# Patient Record
Sex: Male | Born: 1955 | Hispanic: No | Marital: Married | Smoking: Current every day smoker
Health system: Southern US, Community
[De-identification: ages and names within clinical notes are randomized; demographics above are authoritative.]

## PROBLEM LIST (undated history)

## (undated) DIAGNOSIS — G610 Guillain-Barre syndrome: Secondary | ICD-10-CM

## (undated) DIAGNOSIS — G629 Polyneuropathy, unspecified: Secondary | ICD-10-CM

## (undated) HISTORY — DX: Guillain-Barre syndrome: G61.0

## (undated) HISTORY — DX: Polyneuropathy, unspecified: G62.9

---

## 2020-08-10 ENCOUNTER — Other Ambulatory Visit: Payer: Self-pay

## 2020-08-10 ENCOUNTER — Encounter (HOSPITAL_BASED_OUTPATIENT_CLINIC_OR_DEPARTMENT_OTHER): Payer: Self-pay

## 2020-08-10 DIAGNOSIS — M791 Myalgia, unspecified site: Secondary | ICD-10-CM | POA: Insufficient documentation

## 2020-08-10 DIAGNOSIS — F17210 Nicotine dependence, cigarettes, uncomplicated: Secondary | ICD-10-CM | POA: Insufficient documentation

## 2020-08-10 DIAGNOSIS — M79601 Pain in right arm: Secondary | ICD-10-CM | POA: Insufficient documentation

## 2020-08-10 DIAGNOSIS — Z20822 Contact with and (suspected) exposure to covid-19: Secondary | ICD-10-CM | POA: Insufficient documentation

## 2020-08-10 DIAGNOSIS — E1140 Type 2 diabetes mellitus with diabetic neuropathy, unspecified: Secondary | ICD-10-CM | POA: Diagnosis not present

## 2020-08-10 DIAGNOSIS — R202 Paresthesia of skin: Secondary | ICD-10-CM | POA: Diagnosis not present

## 2020-08-10 LAB — CBC WITH DIFFERENTIAL/PLATELET
Abs Immature Granulocytes: 0.12 10*3/uL — ABNORMAL HIGH (ref 0.00–0.07)
Basophils Absolute: 0.1 10*3/uL (ref 0.0–0.1)
Basophils Relative: 1 %
Eosinophils Absolute: 0.4 10*3/uL (ref 0.0–0.5)
Eosinophils Relative: 2 %
HCT: 46.8 % (ref 39.0–52.0)
Hemoglobin: 15.5 g/dL (ref 13.0–17.0)
Immature Granulocytes: 1 %
Lymphocytes Relative: 14 %
Lymphs Abs: 2.3 10*3/uL (ref 0.7–4.0)
MCH: 27.9 pg (ref 26.0–34.0)
MCHC: 33.1 g/dL (ref 30.0–36.0)
MCV: 84.3 fL (ref 80.0–100.0)
Monocytes Absolute: 1.2 10*3/uL — ABNORMAL HIGH (ref 0.1–1.0)
Monocytes Relative: 7 %
Neutro Abs: 12.4 10*3/uL — ABNORMAL HIGH (ref 1.7–7.7)
Neutrophils Relative %: 75 %
Platelets: 412 10*3/uL — ABNORMAL HIGH (ref 150–400)
RBC: 5.55 MIL/uL (ref 4.22–5.81)
RDW: 12.9 % (ref 11.5–15.5)
WBC: 16.4 10*3/uL — ABNORMAL HIGH (ref 4.0–10.5)
nRBC: 0 % (ref 0.0–0.2)

## 2020-08-10 LAB — BASIC METABOLIC PANEL
Anion gap: 12 (ref 5–15)
BUN: 20 mg/dL (ref 8–23)
CO2: 23 mmol/L (ref 22–32)
Calcium: 9.5 mg/dL (ref 8.9–10.3)
Chloride: 99 mmol/L (ref 98–111)
Creatinine, Ser: 1.14 mg/dL (ref 0.61–1.24)
GFR, Estimated: >60 mL/min (ref >60)
Glucose, Bld: 113 mg/dL — ABNORMAL HIGH (ref 70–99)
Potassium: 3.8 mmol/L (ref 3.5–5.1)
Sodium: 134 mmol/L — ABNORMAL LOW (ref 135–145)

## 2020-08-10 NOTE — ED Triage Notes (Addendum)
Pt c/o pain to bilat LE and right UE-pain started 1/29-denies injury-NAD-steady gait-son later added pt c/o pain all over-denies flu sx, n/v/d-pt speaks some english interpreting prn

## 2020-08-11 ENCOUNTER — Other Ambulatory Visit: Payer: Self-pay

## 2020-08-11 ENCOUNTER — Emergency Department (HOSPITAL_BASED_OUTPATIENT_CLINIC_OR_DEPARTMENT_OTHER)
Admission: EM | Admit: 2020-08-11 | Discharge: 2020-08-11 | Disposition: A | Discharge status: Home / Self Care | Payer: Medicare Other

## 2020-08-11 ENCOUNTER — Emergency Department (HOSPITAL_BASED_OUTPATIENT_CLINIC_OR_DEPARTMENT_OTHER)
Admission: EM | Admit: 2020-08-11 | Discharge: 2020-08-11 | Disposition: A | Discharge status: Home / Self Care | Payer: Medicare Other | Source: Home / Self Care | Attending: Emergency Medicine | Admitting: Emergency Medicine

## 2020-08-11 DIAGNOSIS — M791 Myalgia, unspecified site: Secondary | ICD-10-CM

## 2020-08-11 LAB — URINALYSIS, ROUTINE W REFLEX MICROSCOPIC
Bilirubin Urine: NEGATIVE
Glucose, UA: NEGATIVE mg/dL
Ketones, ur: NEGATIVE mg/dL
Leukocytes,Ua: NEGATIVE
Nitrite: NEGATIVE
Protein, ur: 30 mg/dL — AB
Specific Gravity, Urine: 1.02 (ref 1.005–1.030)
pH: 5 (ref 5.0–8.0)

## 2020-08-11 LAB — URINALYSIS, MICROSCOPIC (REFLEX)
Bacteria, UA: NONE SEEN
WBC, UA: NONE SEEN WBC/hpf (ref 0–5)

## 2020-08-11 LAB — SARS CORONAVIRUS 2 (TAT 6-24 HRS): SARS Coronavirus 2: NEGATIVE

## 2020-08-11 LAB — CK: Total CK: 241 U/L (ref 49–397)

## 2020-08-11 MED ORDER — NAPROXEN 375 MG PO TABS: ORAL_TABLET | ORAL | 0 refills | Status: DC

## 2020-08-11 MED ORDER — NAPROXEN 250 MG PO TABS
500.0000 mg | ORAL_TABLET | Freq: Once | ORAL | Status: AC
  Administered 2020-08-11: 500 mg via ORAL
  Filled 2020-08-11: qty 2

## 2020-08-11 NOTE — ED Notes (Signed)
Patient verbalizes understanding of discharge instructions. Opportunity for questioning and answers were provided. Armband removed by staff, pt discharged from ED ambulatory to home.  

## 2020-08-11 NOTE — ED Provider Notes (Signed)
[facility] Provider Note: [name], MD, FACEP  CSN: [id number] MRN: [id number] ARRIVAL: 08/10/20 at 2023 ROOM: MH05/MH05   CHIEF COMPLAINT  Leg Pain and Generalized Body Aches   HISTORY OF PRESENT ILLNESS  08/11/20 2:03 AM Jacob Cameron is a 65 y.o. male with 3 days of pain in his legs and right arm.  The pain is aching in nature and he rates it as a 5 out of 10.  There is also some generalized body pain as well.  It is somewhat worse with movement.  He has not taken anything for it.  He has not noted fever or other Covid symptoms.  He denies running, lifting or otherwise exerting himself but has been walking.  History reviewed. No pertinent past medical history.  History reviewed. No pertinent surgical history.  No family history on file.  Social History   Tobacco Use  . Smoking status: Current Every Day Smoker    Types: Cigarettes  . Smokeless tobacco: Never Used  Substance Use Topics  . Alcohol use: Never  . Drug use: Never    Prior to Admission medications   Medication Sig Start Date End Date Taking? Authorizing Provider  naproxen (NAPROSYN) 375 MG tablet Take 1 tablet twice daily as needed for muscle pain. 08/11/20  Yes Davinia Riccardi, MD    Allergies Patient has no known allergies.   REVIEW OF SYSTEMS  Negative except as noted here or in the History of Present Illness.   PHYSICAL EXAMINATION  Initial Vital Signs Blood pressure 133/83, pulse (!) 101, temperature (!) 97.5 F (36.4 C), temperature source Oral, resp. rate 16, height 5\' 7"  (1.702 m), weight 80.3 kg, SpO2 97 %.  Examination General: Well-developed, well-nourished male in no acute distress; appearance consistent with age of record HENT: normocephalic; atraumatic Eyes: pupils equal, round and reactive to light; extraocular muscles intact Neck: supple Heart: regular rate and rhythm Lungs: clear to auscultation bilaterally Abdomen: soft; nondistended; nontender; bowel sounds  present Extremities: No deformity; full range of motion; pulses normal; muscle compartments soft Neurologic: Awake, alert and oriented; motor function intact in all extremities and symmetric; no facial droop Skin: Warm and dry Psychiatric: Normal mood and affect   RESULTS  Summary of this visit's results, reviewed and interpreted by myself:   EKG Interpretation  Date/Time:    Ventricular Rate:    PR Interval:    QRS Duration:   QT Interval:    QTC Calculation:   R Axis:     Text Interpretation:        Laboratory Studies: Results for orders placed or performed during the hospital encounter of 08/11/20 (from the past 24 hour(s))  CBC with Differential     Status: Abnormal   Collection Time: 08/10/20  9:20 PM  Result Value Ref Range   WBC 16.4 (H) 4.0 - 10.5 K/uL   RBC 5.55 4.22 - 5.81 MIL/uL   Hemoglobin 15.5 13.0 - 17.0 g/dL   HCT 08/12/20 86.5 - 78.4 %   MCV 84.3 80.0 - 100.0 fL   MCH 27.9 26.0 - 34.0 pg   MCHC 33.1 30.0 - 36.0 g/dL   RDW 69.6 29.5 - 28.4 %   Platelets 412 (H) 150 - 400 K/uL   nRBC 0.0 0.0 - 0.2 %   Neutrophils Relative % 75 %   Neutro Abs 12.4 (H) 1.7 - 7.7 K/uL   Lymphocytes Relative 14 %   Lymphs Abs 2.3 0.7 - 4.0 K/uL   Monocytes Relative 7 %  Monocytes Absolute 1.2 (H) 0.1 - 1.0 K/uL   Eosinophils Relative 2 %   Eosinophils Absolute 0.4 0.0 - 0.5 K/uL   Basophils Relative 1 %   Basophils Absolute 0.1 0.0 - 0.1 K/uL   Immature Granulocytes 1 %   Abs Immature Granulocytes 0.12 (H) 0.00 - 0.07 K/uL  Basic metabolic panel     Status: Abnormal   Collection Time: 08/10/20  9:20 PM  Result Value Ref Range   Sodium 134 (L) 135 - 145 mmol/L   Potassium 3.8 3.5 - 5.1 mmol/L   Chloride 99 98 - 111 mmol/L   CO2 23 22 - 32 mmol/L   Glucose, Bld 113 (H) 70 - 99 mg/dL   BUN 20 8 - 23 mg/dL   Creatinine, Ser 0.11 0.61 - 1.24 mg/dL   Calcium 9.5 8.9 - 00.3 mg/dL   GFR, Estimated >49 >61 mL/min   Anion gap 12 5 - 15  CK     Status: None   Collection  Time: 08/10/20  9:20 PM  Result Value Ref Range   Total CK 241 49 - 397 U/L  Urinalysis, Routine w reflex microscopic     Status: Abnormal   Collection Time: 08/11/20 12:50 AM  Result Value Ref Range   Color, Urine YELLOW YELLOW   APPearance CLEAR CLEAR   Specific Gravity, Urine 1.020 1.005 - 1.030   pH 5.0 5.0 - 8.0   Glucose, UA NEGATIVE NEGATIVE mg/dL   Hgb urine dipstick LARGE (A) NEGATIVE   Bilirubin Urine NEGATIVE NEGATIVE   Ketones, ur NEGATIVE NEGATIVE mg/dL   Protein, ur 30 (A) NEGATIVE mg/dL   Nitrite NEGATIVE NEGATIVE   Leukocytes,Ua NEGATIVE NEGATIVE  Urinalysis, Microscopic (reflex)     Status: None   Collection Time: 08/11/20 12:50 AM  Result Value Ref Range   RBC / HPF 0-5 0 - 5 RBC/hpf   WBC, UA NONE SEEN 0 - 5 WBC/hpf   Bacteria, UA NONE SEEN NONE SEEN   Squamous Epithelial / LPF 0-5 0 - 5   Granular Casts, UA PRESENT    Imaging Studies: No results found.  ED COURSE and MDM  Nursing notes, initial and subsequent vitals signs, including pulse oximetry, reviewed and interpreted by myself.  Vitals:   08/10/20 2036 08/11/20 0054 08/11/20 0253  BP: (!) 136/102 133/83 125/89  Pulse: (!) 105 (!) 101 100  Resp: 18 16 16   Temp: (!) 97.5 F (36.4 C)    TempSrc: Oral    SpO2: 99% 97% 95%  Weight: 80.3 kg    Height: 5\' 7"  (1.702 m)     Medications  naproxen (NAPROSYN) tablet 500 mg (500 mg Oral Given 08/11/20 0253)   3:00 AM The cause of the patient's pain is unclear.  No evidence of rhabdomyolysis.  He has not been febrile or had other Covid-like symptoms but a Covid test is pending.  He does have an elevated white count but this is nonspecific.  We will refer to sports medicine for further evaluation.  Differential diagnosis includes rheumatologic condition such as polymyalgia rheumatica or polymyositis which are difficult to diagnose in the emergency department setting.   PROCEDURES  Procedures   ED DIAGNOSES     ICD-10-CM   1. Muscle pain  M79.10         Ersilia Brawley, , MD 08/11/20 [phone]

## 2020-08-12 ENCOUNTER — Encounter (HOSPITAL_BASED_OUTPATIENT_CLINIC_OR_DEPARTMENT_OTHER): Payer: Self-pay | Admitting: *Deleted

## 2020-08-12 ENCOUNTER — Inpatient Hospital Stay (HOSPITAL_BASED_OUTPATIENT_CLINIC_OR_DEPARTMENT_OTHER)
Admission: EM | Admit: 2020-08-12 | Discharge: 2020-09-01 | DRG: 074 | Disposition: A | Discharge status: Rehabilitation Facility | Payer: Medicare Other | Attending: Internal Medicine | Admitting: Internal Medicine

## 2020-08-12 ENCOUNTER — Other Ambulatory Visit: Payer: Self-pay

## 2020-08-12 DIAGNOSIS — Z20822 Contact with and (suspected) exposure to covid-19: Secondary | ICD-10-CM | POA: Diagnosis present

## 2020-08-12 DIAGNOSIS — G610 Guillain-Barre syndrome: Secondary | ICD-10-CM | POA: Diagnosis present

## 2020-08-12 DIAGNOSIS — Z720 Tobacco use: Secondary | ICD-10-CM | POA: Diagnosis present

## 2020-08-12 DIAGNOSIS — M47816 Spondylosis without myelopathy or radiculopathy, lumbar region: Secondary | ICD-10-CM | POA: Diagnosis present

## 2020-08-12 DIAGNOSIS — E785 Hyperlipidemia, unspecified: Secondary | ICD-10-CM | POA: Diagnosis present

## 2020-08-12 DIAGNOSIS — M47814 Spondylosis without myelopathy or radiculopathy, thoracic region: Secondary | ICD-10-CM | POA: Diagnosis present

## 2020-08-12 DIAGNOSIS — M792 Neuralgia and neuritis, unspecified: Secondary | ICD-10-CM

## 2020-08-12 DIAGNOSIS — G603 Idiopathic progressive neuropathy: Secondary | ICD-10-CM | POA: Diagnosis present

## 2020-08-12 DIAGNOSIS — R946 Abnormal results of thyroid function studies: Secondary | ICD-10-CM | POA: Diagnosis present

## 2020-08-12 DIAGNOSIS — F40240 Claustrophobia: Secondary | ICD-10-CM | POA: Diagnosis present

## 2020-08-12 DIAGNOSIS — I10 Essential (primary) hypertension: Secondary | ICD-10-CM | POA: Diagnosis present

## 2020-08-12 DIAGNOSIS — M4722 Other spondylosis with radiculopathy, cervical region: Secondary | ICD-10-CM | POA: Diagnosis present

## 2020-08-12 DIAGNOSIS — E1140 Type 2 diabetes mellitus with diabetic neuropathy, unspecified: Principal | ICD-10-CM | POA: Diagnosis present

## 2020-08-12 DIAGNOSIS — R202 Paresthesia of skin: Secondary | ICD-10-CM | POA: Diagnosis present

## 2020-08-12 DIAGNOSIS — D72828 Other elevated white blood cell count: Secondary | ICD-10-CM | POA: Diagnosis present

## 2020-08-12 DIAGNOSIS — K0889 Other specified disorders of teeth and supporting structures: Secondary | ICD-10-CM | POA: Diagnosis present

## 2020-08-12 DIAGNOSIS — E222 Syndrome of inappropriate secretion of antidiuretic hormone: Secondary | ICD-10-CM | POA: Diagnosis present

## 2020-08-12 DIAGNOSIS — F419 Anxiety disorder, unspecified: Secondary | ICD-10-CM | POA: Diagnosis present

## 2020-08-12 DIAGNOSIS — F17210 Nicotine dependence, cigarettes, uncomplicated: Secondary | ICD-10-CM | POA: Diagnosis present

## 2020-08-12 DIAGNOSIS — R5381 Other malaise: Secondary | ICD-10-CM | POA: Diagnosis present

## 2020-08-12 DIAGNOSIS — E538 Deficiency of other specified B group vitamins: Secondary | ICD-10-CM | POA: Diagnosis present

## 2020-08-12 NOTE — ED Provider Notes (Addendum)
[facility] Provider Note: [name], MD, FACEP  CSN: [id number] MRN: [id number] ARRIVAL: 08/12/20 at 2044 ROOM: MH06/MH06   CHIEF COMPLAINT  Pain   HISTORY OF PRESENT ILLNESS  08/12/20 10:59 PM Jacob Cameron is a 65 y.o. male who was seen by myself yesterday for muscle pain.  To wit:  "Jacob Cameron is a 65 y.o. male with 3 days of pain in his legs and right arm.  The pain is aching in nature and he rates it as a 5 out of 10.  There is also some generalized body pain as well.  It is somewhat worse with movement.  He has not taken anything for it.  He has not noted fever or other Covid symptoms.  He denies running, lifting or otherwise exerting himself but has been walking."  His laboratory studies were unremarkable and his Covid test, which resulted later, is negative as well.  He was treated with naproxen and referred to sports medicine and he has an appointment tomorrow.  He is here requesting something stronger for the pain as he now rates it a 10 out of 10.  The pain not only worsened since yesterday but he is now having paresthesias of the right arm and lower extremities.  He states the pain is so severe he is not able to walk.  He denies muscle weakness but it simply hurts too much to move.  He has not had a fever.   History reviewed. No pertinent past medical history.  History reviewed. No pertinent surgical history.  No family history on file.  Social History   Tobacco Use  . Smoking status: Current Every Day Smoker    Types: Cigarettes  . Smokeless tobacco: Never Used  Substance Use Topics  . Alcohol use: Never  . Drug use: Never    Prior to Admission medications   Medication Sig Start Date End Date Taking? Authorizing Provider  HYDROcodone-acetaminophen (NORCO) 5-325 MG tablet Take 1 tablet by mouth every 6 (six) hours as needed. 08/13/20  Yes Brithney Bensen, MD  naproxen (NAPROSYN) 375 MG tablet Take 1 tablet twice daily as needed for muscle pain. 08/11/20    Elara Cocke, Jonny Ruiz, MD    Allergies Patient has no known allergies.   REVIEW OF SYSTEMS  Negative except as noted here or in the History of Present Illness.   PHYSICAL EXAMINATION  Initial Vital Signs Blood pressure (!) 123/98, pulse (!) 105, temperature 98.4 F (36.9 C), temperature source Oral, resp. rate 20, height 5\' 7"  (1.702 m), weight 80.3 kg, SpO2 100 %.  Examination General: Well-developed, well-nourished male in no acute distress; appearance consistent with age of record HENT: normocephalic; atraumatic Eyes: pupils equal, round and reactive to light; extraocular muscles intact Neck: supple Heart: regular rate and rhythm Lungs: clear to auscultation bilaterally Abdomen: soft; nondistended; nontender; bowel sounds present Extremities: No deformity; full range of motion; pulses normal Neurologic: Awake, alert and oriented; motor function intact in all extremities and symmetric; no facial droop; superficial tenderness and altered sensation of lower extremities and right upper extremity Skin: Warm and dry Psychiatric: Normal mood and affect   RESULTS  Summary of this visit's results, reviewed and interpreted by myself:   EKG Interpretation  Date/Time:    Ventricular Rate:    PR Interval:    QRS Duration:   QT Interval:    QTC Calculation:   R Axis:     Text Interpretation:        Laboratory Studies: No results  found for this or any previous visit (from the past 24 hour(s)). Imaging Studies: No results found.  ED COURSE and MDM  Nursing notes, initial and subsequent vitals signs, including pulse oximetry, reviewed and interpreted by myself.  Vitals:   08/12/20 2053 08/12/20 2220  BP: (!) 147/101 (!) 123/98  Pulse: 89 (!) 105  Resp: 18 20  Temp: 98.4 F (36.9 C)   TempSrc: Oral   SpO2: 100% 100%  Weight: 80.3 kg   Height: 5\' 7"  (1.702 m)    Medications  HYDROcodone-acetaminophen (NORCO/VICODIN) 5-325 MG per tablet 2 tablet (2 tablets Oral Given 08/13/20  0030)    12:31 AM The patient was evaluated by teleneurology.  The teleneurologist is concerned for myelopathy or radiculopathy.  He recommends MRIs of the C, T and L-spine.  The patient has an appointment later today with Dr. 10/11/20 of sports medicine.  I will send Dr. Clare Gandy a note regarding these recommendations.  Admission to the hospital was discussed with the patient but he declined preferring to follow-up as an outpatient.  We will prescribe hydrocodone in the meantime.  1:24 AM At time of discharge patient states he was in too much pain to go home.  He would like to be admitted after all.  This was discussed with Dr. Jordan Likes who will admit him to the hospitalist service.  PROCEDURES  Procedures CRITICAL CARE Performed by: Loney Loh Birdie Beveridge Total critical care time: 30 minutes Critical care time was exclusive of separately billable procedures and treating other patients. Critical care was necessary to treat or prevent imminent or life-threatening deterioration. Critical care was time spent personally by me on the following activities: development of treatment plan with patient and/or surrogate as well as nursing, discussions with consultants, evaluation of patient's response to treatment, examination of patient, obtaining history from patient or surrogate, ordering and performing treatments and interventions, ordering and review of laboratory studies, ordering and review of radiographic studies, pulse oximetry and re-evaluation of patient's condition.   ED DIAGNOSES     ICD-10-CM   1. Neuropathic pain  M79.2   2. Paresthesias  R20.2        Carlisle Beers, MD 08/13/20 10/11/20    1779, MD 08/13/20 0124    10/11/20, MD 08/13/20 10/11/20

## 2020-08-12 NOTE — ED Notes (Signed)
Called Tele Neuro for non-stat consult spoke to [facility]

## 2020-08-12 NOTE — ED Triage Notes (Addendum)
Seen  Here yesterday for same C/o cont " pain all over" appt with ortho tomorrow , requesting something " stronger for pain "

## 2020-08-13 ENCOUNTER — Observation Stay (HOSPITAL_COMMUNITY): Payer: Medicare Other

## 2020-08-13 ENCOUNTER — Encounter (HOSPITAL_COMMUNITY): Payer: Self-pay | Admitting: Internal Medicine

## 2020-08-13 ENCOUNTER — Ambulatory Visit: Payer: Medicare Other | Admitting: Family Medicine

## 2020-08-13 DIAGNOSIS — R5381 Other malaise: Secondary | ICD-10-CM | POA: Diagnosis not present

## 2020-08-13 DIAGNOSIS — M792 Neuralgia and neuritis, unspecified: Secondary | ICD-10-CM

## 2020-08-13 DIAGNOSIS — Z720 Tobacco use: Secondary | ICD-10-CM | POA: Diagnosis present

## 2020-08-13 DIAGNOSIS — M47814 Spondylosis without myelopathy or radiculopathy, thoracic region: Secondary | ICD-10-CM | POA: Diagnosis not present

## 2020-08-13 DIAGNOSIS — R202 Paresthesia of skin: Secondary | ICD-10-CM | POA: Diagnosis present

## 2020-08-13 DIAGNOSIS — E538 Deficiency of other specified B group vitamins: Secondary | ICD-10-CM | POA: Diagnosis not present

## 2020-08-13 DIAGNOSIS — G610 Guillain-Barre syndrome: Secondary | ICD-10-CM | POA: Diagnosis not present

## 2020-08-13 DIAGNOSIS — E222 Syndrome of inappropriate secretion of antidiuretic hormone: Secondary | ICD-10-CM | POA: Diagnosis not present

## 2020-08-13 DIAGNOSIS — F40240 Claustrophobia: Secondary | ICD-10-CM | POA: Diagnosis not present

## 2020-08-13 DIAGNOSIS — R946 Abnormal results of thyroid function studies: Secondary | ICD-10-CM | POA: Diagnosis not present

## 2020-08-13 DIAGNOSIS — F17210 Nicotine dependence, cigarettes, uncomplicated: Secondary | ICD-10-CM | POA: Diagnosis not present

## 2020-08-13 DIAGNOSIS — E1140 Type 2 diabetes mellitus with diabetic neuropathy, unspecified: Secondary | ICD-10-CM | POA: Diagnosis not present

## 2020-08-13 DIAGNOSIS — Z20822 Contact with and (suspected) exposure to covid-19: Secondary | ICD-10-CM | POA: Diagnosis not present

## 2020-08-13 DIAGNOSIS — I10 Essential (primary) hypertension: Secondary | ICD-10-CM | POA: Diagnosis not present

## 2020-08-13 DIAGNOSIS — D72828 Other elevated white blood cell count: Secondary | ICD-10-CM | POA: Diagnosis not present

## 2020-08-13 DIAGNOSIS — F419 Anxiety disorder, unspecified: Secondary | ICD-10-CM | POA: Diagnosis not present

## 2020-08-13 DIAGNOSIS — K0889 Other specified disorders of teeth and supporting structures: Secondary | ICD-10-CM | POA: Diagnosis not present

## 2020-08-13 DIAGNOSIS — M47816 Spondylosis without myelopathy or radiculopathy, lumbar region: Secondary | ICD-10-CM | POA: Diagnosis not present

## 2020-08-13 DIAGNOSIS — M4722 Other spondylosis with radiculopathy, cervical region: Secondary | ICD-10-CM | POA: Diagnosis not present

## 2020-08-13 DIAGNOSIS — E785 Hyperlipidemia, unspecified: Secondary | ICD-10-CM | POA: Diagnosis not present

## 2020-08-13 LAB — SARS CORONAVIRUS 2 BY RT PCR (HOSPITAL ORDER, PERFORMED IN ~~LOC~~ HOSPITAL LAB): SARS Coronavirus 2: NEGATIVE

## 2020-08-13 MED ORDER — LORAZEPAM 2 MG/ML IJ SOLN
1.0000 mg | Freq: Once | INTRAMUSCULAR | Status: DC
  Filled 2020-08-13 (×2): qty 1

## 2020-08-13 MED ORDER — ENOXAPARIN SODIUM 40 MG/0.4ML ~~LOC~~ SOLN
40.0000 mg | SUBCUTANEOUS | Status: DC
  Administered 2020-08-13 – 2020-08-31 (×19): 40 mg via SUBCUTANEOUS
  Filled 2020-08-13 (×20): qty 0.4

## 2020-08-13 MED ORDER — HYDROCODONE-ACETAMINOPHEN 5-325 MG PO TABS: 1.0000 | ORAL_TABLET | Freq: Four times a day (QID) | ORAL | 0 refills | Status: DC | PRN

## 2020-08-13 MED ORDER — SENNOSIDES-DOCUSATE SODIUM 8.6-50 MG PO TABS
1.0000 | ORAL_TABLET | Freq: Every evening | ORAL | Status: DC | PRN
Start: 2020-08-13 — End: 2020-09-01
  Administered 2020-08-17: 1 via ORAL
  Filled 2020-08-13: qty 1

## 2020-08-13 MED ORDER — ACETAMINOPHEN 650 MG RE SUPP: 650.0000 mg | RECTAL | Status: DC | PRN

## 2020-08-13 MED ORDER — HYDROCODONE-ACETAMINOPHEN 5-325 MG PO TABS
2.0000 | ORAL_TABLET | ORAL | Status: DC | PRN
  Administered 2020-08-13 – 2020-08-15 (×7): 2 via ORAL
  Filled 2020-08-13 (×8): qty 2

## 2020-08-13 MED ORDER — SODIUM CHLORIDE 0.9 % IV SOLN: INTRAVENOUS | Status: DC

## 2020-08-13 MED ORDER — HYDROCODONE-ACETAMINOPHEN 5-325 MG PO TABS
2.0000 | ORAL_TABLET | Freq: Once | ORAL | Status: AC
  Administered 2020-08-13: 2 via ORAL
  Filled 2020-08-13: qty 2

## 2020-08-13 MED ORDER — ONDANSETRON HCL 4 MG/2ML IJ SOLN
4.0000 mg | Freq: Four times a day (QID) | INTRAMUSCULAR | Status: DC | PRN
  Administered 2020-08-14 – 2020-08-30 (×5): 4 mg via INTRAVENOUS
  Filled 2020-08-13 (×6): qty 2

## 2020-08-13 MED ORDER — ACETAMINOPHEN 160 MG/5ML PO SOLN: 650.0000 mg | ORAL | Status: DC | PRN

## 2020-08-13 MED ORDER — ACETAMINOPHEN 325 MG PO TABS
650.0000 mg | ORAL_TABLET | ORAL | Status: DC | PRN
  Administered 2020-08-16 – 2020-08-29 (×7): 650 mg via ORAL
  Filled 2020-08-13 (×7): qty 2

## 2020-08-13 NOTE — Progress Notes (Signed)
Admission requested from med [facility].  Signout per Dr. Jonny Ruiz Molpus: 65 year old male who initially presented to the ED 2 days ago with a 3-day history of pain in his right arm and bilateral lower extremities.  Labs done at that time revealed normal CK.  He was given referral to sports medicine but now returning to the ED with worsening pain and associated paresthesias in his right arm and bilateral lower extremities.  Telemetry neurology was consulted and felt that the patient's symptoms could be due to radiculopathy or myelopathy.  Recommended MRI of cervical, thoracic, and lumbar spine.  Bed request placed.  [facility] will assume care on arrival to accepting facility. Until arrival, care as per EDP. However, [facility] available 24/7 for questions and assistance.

## 2020-08-13 NOTE — Progress Notes (Signed)
Patient was admitted to [facility] this afternoon as a transfer from [facility]. Patient is pleasant and polite, with minimal use of the translator needed. Pt continues complaining about pain, numbness and tingling in the right hand, and legs bilaterally. PRN medications given per order.   MRI was not able to be completed this afternoon due to pain intolerance and excessive movement. MD has been updated wih plans to get Ativan IV on board prior to repeat MRI scan. Son at bedside.

## 2020-08-13 NOTE — H&P (Signed)
History and Physical    Riot Waterworth OYD:[id number] DOB: 04/09/1956 DOA: 08/12/2020  PCP: Patient, No Pcp Per Consultants:  None Patient coming from: Home; NOK: [place], [place] - [phone]  Chief Complaint: paresthesias  HPI: Jacob Cameron is a 65 y.o. male without significant medical history presenting with R arm and B leg paresthesias.  He is experiencing R neck pain with radiculopathy down his R arm and decreased grip strength.  Also pain and tingling in B legs.  He was walking independently but in the last 3-4 days he is unable to walk at all.  No urinary or fecal incontinence or other symptoms.  No fever.  No sick contacts. No recent illness prior.  Symptoms started in B legs and then the arm and the neck was last - all within the same day.  No respiratory symptoms.    ED Course:  [facility] to [facility] transfer, per Dr. Loney Loh:  65 year old male who initially presented to the ED 2 days ago with a 3-day history of pain in his right arm and bilateral lower extremities.  Labs done at that time revealed normal CK.  He was given referral to sports medicine but now returning to the ED with worsening pain and associated paresthesias in his right arm and bilateral lower extremities.  Telemetry neurology was consulted and felt that the patient's symptoms could be due to radiculopathy or myelopathy.  Recommended MRI of cervical, thoracic, and lumbar spine.   Review of Systems: As per HPI; otherwise review of systems reviewed and negative.   Ambulatory Status:  Ambulates without assistance  COVID Vaccine Status:   Complete - Johnson and Johnson, no booster  History reviewed. No pertinent past medical history.  History reviewed. No pertinent surgical history.  Social History   Socioeconomic History  . Marital status: Married    Spouse name: Not on file  . Number of children: Not on file  . Years of education: Not on file  . Highest education level: Not on file  Occupational History  . Occupation:  retired  Tobacco Use  . Smoking status: Current Every Day Smoker    Packs/day: 0.25    Years: 8.00    Pack years: 2.00    Types: Cigarettes  . Smokeless tobacco: Never Used  Substance and Sexual Activity  . Alcohol use: Never  . Drug use: Never  . Sexual activity: Not on file  Other Topics Concern  . Not on file  Social History Narrative  . Not on file   Social Determinants of Health   Financial Resource Strain: Not on file  Food Insecurity: Not on file  Transportation Needs: Not on file  Physical Activity: Not on file  Stress: Not on file  Social Connections: Not on file  Intimate Partner Violence: Not on file    No Known Allergies  History reviewed. No pertinent family history.  Prior to Admission medications   Medication Sig Start Date End Date Taking? Authorizing Provider  HYDROcodone-acetaminophen (NORCO) 5-325 MG tablet Take 1 tablet by mouth every 6 (six) hours as needed. 08/13/20  Yes Molpus, John, MD  naproxen (NAPROSYN) 375 MG tablet Take 1 tablet twice daily as needed for muscle pain. 08/11/20   Molpus, Jonny Ruiz, MD    Physical Exam: Vitals:   08/13/20 0607 08/13/20 1010 08/13/20 1208 08/13/20 1428  BP: (!) 141/91 (!) 154/118 112/80 100/76  Pulse: 85 91 90 89  Resp: 18 18 18 17   Temp:   98.4 F (36.9 C) 98.7 F (37.1  C)  TempSrc:   Oral   SpO2: 95% 95% 96% 98%  Weight:      Height:         . General:  Appears calm and comfortable and is in NAD, engaging . Eyes:  PERRL, EOMI, normal lids, iris . ENT:  grossly normal hearing, lips & tongue, mmm . Neck:  no LAD, masses or thyromegaly . Cardiovascular:  RRR, no m/r/g. No LE edema.  Marland Kitchen Respiratory:   CTA bilaterally with no wheezes/rales/rhonchi.  Normal respiratory effort. . Abdomen:  soft, NT, ND, NABS . Back:   normal alignment, no CVAT; + R trapezius muscle TTP . Skin:  no rash or induration seen on limited exam . Musculoskeletal:  grossly normal tone BUE/BLE, good ROM, no bony  abnormality . Psychiatric:  grossly normal mood and affect, speech fluent and appropriate, AOx3 . Neurologic:  CN 2-12 grossly intact, moves all extremities in coordinated fashion, sensation intact but with tingling in RUE and BLE    Radiological Exams on Admission: Independently reviewed - see discussion in A/P where applicable  MR BRAIN WO CONTRAST  Result Date: 08/13/2020 CLINICAL DATA:  Neuro deficit, acute stroke suspected. EXAM: MRI HEAD WITHOUT CONTRAST TECHNIQUE: Multiplanar, multiecho pulse sequences of the brain and surrounding structures were obtained without intravenous contrast. COMPARISON:  None. FINDINGS: Brain: No acute infarction, hemorrhage, hydrocephalus, extra-axial collection or mass lesion. Mild for age T2/FLAIR hyperintensities within the white matter, likely related to chronic microvascular ischemic disease. Mild diffuse cerebral atrophy. Vascular: Major arterial flow voids are maintained at the skull base. Skull and upper cervical spine: Normal marrow signal. Sinuses/Orbits: Scattered paranasal sinus mucosal thickening with air-fluid level in the left maxillary sinus. Other: Motion limited study. IMPRESSION: 1. No evidence of acute intracranial abnormality on this motion limited exam. No acute infarct. 2. Nonspecific paranasal sinus disease with air-fluid level in the left maxillary sinus. Correlate with signs/symptoms of sinusitis. Electronically Signed   By: Feliberto Harts MD   On: 08/13/2020 17:32    EKG:  negative   Labs on Admission: I have personally reviewed the available labs and imaging studies at the time of the admission.  Pertinent labs:   Glucose 113 CK 241 WBC 16.4 Platelets 412 UA: large Hgb, 30 protein   Assessment/Plan Principal Problem:   Paresthesias Active Problems:   Tobacco abuse   Paresthesias -Patient with acute onset of R neck and shoulder pain, RUE tingling with decreased grip strength, and BLE weakness, tingling -Associated  pain is improved with pain medication -He is not obviously weak on exam -Teleneurology saw him at [facility] and recommended MRIs -MRI brain unremarkable; MRI C-T-L spine pending (needs Ativan since he got fidgety) -No particularly concerning signs currently for GBS, but will hold flu shot until evaluation is complete -Consider neurology evaluation based on MRI findings and/or symptom progression - but he was overall stable in appearance at the time of my exam -After discussion with Dr. Amada Jupiter, will go ahead and consult neurology now to see patient -Continue to observe on Med Surg for now -q4h neuro checks -PT/OT/TOC team consults requested  Tobacco abuse -Reports smoking only a few cigarettes per day, none this week -Declines patch -Smoking cessation encouraged    Note: This patient has been tested and is negative for the novel coronavirus COVID-19. The patient has been fully vaccinated against COVID-19 with 1 dose of Johnson and Hargill.   Level of care: Med-Surg DVT prophylaxis:  Lovenox  Code Status:  Full - confirmed with  patient Family Communication: None present Disposition Plan:  The patient is from: home  Anticipated d/c is to: home, possibly with [facility] services  Anticipated d/c date will depend on clinical response to treatment, but possibly as early as tomorrow if he has excellent response to treatment  Patient is currently: acutely ill Consults called: Neurology; PT/OT/TOC team Admission status:  It is my clinical opinion that referral for OBSERVATION is reasonable and necessary in this patient based on the above information provided. The aforementioned taken together are felt to place the patient at high risk for further clinical deterioration. However it is anticipated that the patient may be medically stable for discharge from the hospital within 24 to 48 hours.    Jonah Blue MD [facility]   How to contact the [facility] Attending or Consulting provider 7A - 7P  or covering provider during after hours 7P -7A, for this patient?  1. Check the care team in [facility] and look for a) attending/consulting [facility] provider listed and b) the [facility] team listed 2. Log into [email or web address] and use [facility] universal password to access. If you do not have the password, please contact the hospital operator. 3. Locate the [facility] provider you are looking for under [facility] and page to a number that you can be directly reached. 4. If you still have difficulty reaching the provider, please page the [facility] (Director on Call) for the Hospitalists listed on amion for assistance.   08/13/2020, 6:34 PM

## 2020-08-13 NOTE — Consult Note (Signed)
[facility] TeleNeurology Consult Services  Stat Consult  Date of Service:   08/12/2020 23:09:46  Diagnosis:     .  R20.2 - Paresthesia of skin  Impression: The patient is a 65 year old man with no significant past medical history who presents with right arm and bilateral leg paresthesias with shooting pains. Consider radiculopathy vs myelopathy. Recommend further evaluation with MRI C-spine, T-spine, L-spine with and without contrast. Consider symptomatic treatment of neuropathic pain with neurontin or Lyrica.  CT HEAD: Not Reviewed no neuroimaging done  Our recommendations are outlined below.  Additional Recommendations: MRI C-spine, T-Spine, L-spine with and without contrast   Labs: CK 241  Metrics: [facility] Notification Time: 08/12/2020 23:09:14 Stamp Time: 08/12/2020 23:09:46 Callback Response Time: 08/12/2020 23:15:16   ----------------------------------------------------------------------------------------------------  Chief Complaint: right arm and bilateral leg pain/paresthesias  History of Present Illness: Patient is a 65 year old Male.  Patient presents with bilateral leg and right arm pain and paresthesias. Symptoms started 3 days ago in bilateral legs, symmetric, that then progressed up right arm. Reports pins and needles paresthesias particularly in bilateral calves, and LBP pain and shooting pains down legs. Also reports neck pain with shooting pain down arm. Denies neck or extremity pain prior to few days ago. Denies change in bowel or bladder habits. Says pains is so severe it is limiting his gait. No frank weakness noted although finger to nose appears ataxic, right > left.   Past Medical History:     . There is NO history of Hypertension     . There is NO history of Diabetes Mellitus     . There is NO history of Hyperlipidemia     . There is NO history of Atrial Fibrillation     . There is NO history of Coronary Artery  Disease     . There is NO history of Stroke  Anticoagulant use:  No  Antiplatelet use: No   Examination: BP(123/98), Pulse(105), Blood Glucose(113) 1A: Level of Consciousness - Alert; keenly responsive + 0 1B: Ask Month and Age - Both Questions Right + 0 1C: Blink Eyes & Squeeze Hands - Performs Both Tasks + 0 2: Test Horizontal Extraocular Movements - Normal + 0 3: Test Visual Fields - No Visual Loss + 0 4: Test Facial Palsy (Use Grimace if Obtunded) - Normal symmetry + 0 5A: Test Left Arm Motor Drift - No Drift for 10 Seconds + 0 5B: Test Right Arm Motor Drift - No Drift for 10 Seconds + 0 6A: Test Left Leg Motor Drift - No Drift for 5 Seconds + 0 6B: Test Right Leg Motor Drift - No Drift for 5 Seconds + 0 7: Test Limb Ataxia (FNF/Heel-Shin) - Ataxia in 1 Limb + 1 8: Test Sensation - Mild-Moderate Loss: Less Sharp/More Dull + 1 9: Test Language/Aphasia - Normal; No aphasia + 0 10: Test Dysarthria - Normal + 0 11: Test Extinction/Inattention - No abnormality + 0  NIHSS Score: 2   Patient / Family was informed the Neurology Consult would occur via TeleHealth consult by way of interactive audio and video telecommunications and consented to receiving care in this manner.  Patient is being evaluated for possible acute neurologic impairment and high probability of imminent or life - threatening deterioration.I spent total of 20 minutes providing care to this patient, including time for face to face visit via telemedicine, review of medical records, imaging studies and discussion of findings with providers, the patient and / or family.  Dr Rosanne Ashing   [facility] [phone]  Case [id number]

## 2020-08-13 NOTE — Progress Notes (Deleted)
  Jacob Cameron - 65 y.o. male MRN [id number]  Date of birth: Aug 28, 1955  SUBJECTIVE:  Including CC & ROS.  No chief complaint on file.   Jacob Cameron is a 65 y.o. male that is  ***.  ***   Review of Systems See HPI   HISTORY: Past Medical, Surgical, Social, and Family History Reviewed & Updated per EMR.   Pertinent Historical Findings include:  No past medical history on file.  No past surgical history on file.  No family history on file.  Social History   Socioeconomic History  . Marital status: Married    Spouse name: Not on file  . Number of children: Not on file  . Years of education: Not on file  . Highest education level: Not on file  Occupational History  . Occupation: retired  Tobacco Use  . Smoking status: Current Every Day Smoker    Packs/day: 0.25    Years: 8.00    Pack years: 2.00    Types: Cigarettes  . Smokeless tobacco: Never Used  Substance and Sexual Activity  . Alcohol use: Never  . Drug use: Never  . Sexual activity: Not on file  Other Topics Concern  . Not on file  Social History Narrative  . Not on file   Social Determinants of Health   Financial Resource Strain: Not on file  Food Insecurity: Not on file  Transportation Needs: Not on file  Physical Activity: Not on file  Stress: Not on file  Social Connections: Not on file  Intimate Partner Violence: Not on file     PHYSICAL EXAM:  VS: There were no vitals taken for this visit. Physical Exam Gen: NAD, alert, cooperative with exam, well-appearing MSK:  ***      ASSESSMENT & PLAN:   No problem-specific Assessment & Plan notes found for this encounter.

## 2020-08-13 NOTE — Progress Notes (Signed)
PT Cancellation Note  Patient Details Name: Jacob Cameron MRN: [id number] DOB: 12-17-1955   Cancelled Treatment:    Reason Eval/Treat Not Completed: Medical issues which prohibited therapy - pt awaiting spinal imaging, none completed yet. PT to check back when appropriate.  Marye Round, PT Acute Rehabilitation Services Pager [phone]  Office [phone]    Truddie Coco 08/13/2020, 3:38 PM

## 2020-08-13 NOTE — Plan of Care (Signed)

## 2020-08-14 DIAGNOSIS — E119 Type 2 diabetes mellitus without complications: Secondary | ICD-10-CM | POA: Diagnosis not present

## 2020-08-14 DIAGNOSIS — F40240 Claustrophobia: Secondary | ICD-10-CM | POA: Diagnosis present

## 2020-08-14 DIAGNOSIS — G603 Idiopathic progressive neuropathy: Secondary | ICD-10-CM | POA: Diagnosis present

## 2020-08-14 DIAGNOSIS — I10 Essential (primary) hypertension: Secondary | ICD-10-CM | POA: Diagnosis present

## 2020-08-14 DIAGNOSIS — R197 Diarrhea, unspecified: Secondary | ICD-10-CM | POA: Diagnosis not present

## 2020-08-14 DIAGNOSIS — R202 Paresthesia of skin: Secondary | ICD-10-CM

## 2020-08-14 DIAGNOSIS — E871 Hypo-osmolality and hyponatremia: Secondary | ICD-10-CM | POA: Diagnosis not present

## 2020-08-14 DIAGNOSIS — D72828 Other elevated white blood cell count: Secondary | ICD-10-CM | POA: Diagnosis present

## 2020-08-14 DIAGNOSIS — M792 Neuralgia and neuritis, unspecified: Secondary | ICD-10-CM | POA: Diagnosis not present

## 2020-08-14 DIAGNOSIS — M4722 Other spondylosis with radiculopathy, cervical region: Secondary | ICD-10-CM | POA: Diagnosis present

## 2020-08-14 DIAGNOSIS — Z720 Tobacco use: Secondary | ICD-10-CM

## 2020-08-14 DIAGNOSIS — M47816 Spondylosis without myelopathy or radiculopathy, lumbar region: Secondary | ICD-10-CM | POA: Diagnosis present

## 2020-08-14 DIAGNOSIS — E538 Deficiency of other specified B group vitamins: Secondary | ICD-10-CM | POA: Diagnosis present

## 2020-08-14 DIAGNOSIS — D62 Acute posthemorrhagic anemia: Secondary | ICD-10-CM | POA: Diagnosis not present

## 2020-08-14 DIAGNOSIS — G650 Sequelae of Guillain-Barre syndrome: Secondary | ICD-10-CM | POA: Diagnosis not present

## 2020-08-14 DIAGNOSIS — M47814 Spondylosis without myelopathy or radiculopathy, thoracic region: Secondary | ICD-10-CM | POA: Diagnosis present

## 2020-08-14 DIAGNOSIS — G610 Guillain-Barre syndrome: Secondary | ICD-10-CM | POA: Diagnosis present

## 2020-08-14 DIAGNOSIS — K0889 Other specified disorders of teeth and supporting structures: Secondary | ICD-10-CM | POA: Diagnosis present

## 2020-08-14 DIAGNOSIS — R946 Abnormal results of thyroid function studies: Secondary | ICD-10-CM | POA: Diagnosis present

## 2020-08-14 DIAGNOSIS — F17210 Nicotine dependence, cigarettes, uncomplicated: Secondary | ICD-10-CM | POA: Diagnosis present

## 2020-08-14 DIAGNOSIS — R5381 Other malaise: Secondary | ICD-10-CM | POA: Diagnosis present

## 2020-08-14 DIAGNOSIS — E785 Hyperlipidemia, unspecified: Secondary | ICD-10-CM | POA: Diagnosis present

## 2020-08-14 DIAGNOSIS — G629 Polyneuropathy, unspecified: Secondary | ICD-10-CM | POA: Diagnosis not present

## 2020-08-14 DIAGNOSIS — F419 Anxiety disorder, unspecified: Secondary | ICD-10-CM | POA: Diagnosis present

## 2020-08-14 DIAGNOSIS — E1140 Type 2 diabetes mellitus with diabetic neuropathy, unspecified: Secondary | ICD-10-CM | POA: Diagnosis present

## 2020-08-14 DIAGNOSIS — Z20822 Contact with and (suspected) exposure to covid-19: Secondary | ICD-10-CM | POA: Diagnosis present

## 2020-08-14 DIAGNOSIS — E222 Syndrome of inappropriate secretion of antidiuretic hormone: Secondary | ICD-10-CM | POA: Diagnosis present

## 2020-08-14 DIAGNOSIS — G6289 Other specified polyneuropathies: Secondary | ICD-10-CM | POA: Diagnosis not present

## 2020-08-14 LAB — GLUCOSE, CAPILLARY: Glucose-Capillary: 138 mg/dL — ABNORMAL HIGH (ref 70–99)

## 2020-08-14 LAB — SEDIMENTATION RATE: Sed Rate: 20 mm/hr — ABNORMAL HIGH (ref 0–16)

## 2020-08-14 LAB — LIPID PANEL
Cholesterol: 276 mg/dL — ABNORMAL HIGH (ref 0–200)
HDL: 33 mg/dL — ABNORMAL LOW (ref >40)
LDL Cholesterol: 192 mg/dL — ABNORMAL HIGH (ref 0–99)
Total CHOL/HDL Ratio: 8.4 RATIO
Triglycerides: 253 mg/dL — ABNORMAL HIGH (ref <150)
VLDL: 51 mg/dL — ABNORMAL HIGH (ref 0–40)

## 2020-08-14 LAB — HEMOGLOBIN A1C
Hgb A1c MFr Bld: 7 % — ABNORMAL HIGH (ref 4.8–5.6)
Mean Plasma Glucose: 154.2 mg/dL

## 2020-08-14 LAB — TSH: TSH: 5.311 u[IU]/mL — ABNORMAL HIGH (ref 0.350–4.500)

## 2020-08-14 LAB — HIV ANTIBODY (ROUTINE TESTING W REFLEX): HIV Screen 4th Generation wRfx: NONREACTIVE

## 2020-08-14 LAB — C-REACTIVE PROTEIN: CRP: 1.6 mg/dL — ABNORMAL HIGH (ref <1.0)

## 2020-08-14 LAB — VITAMIN B12: Vitamin B-12: 187 pg/mL (ref 180–914)

## 2020-08-14 LAB — T4, FREE: Free T4: 0.84 ng/dL (ref 0.61–1.12)

## 2020-08-14 MED ORDER — INSULIN ASPART 100 UNIT/ML ~~LOC~~ SOLN
0.0000 [IU] | Freq: Three times a day (TID) | SUBCUTANEOUS | Status: DC
  Administered 2020-08-15 – 2020-08-22 (×4): 1 [IU] via SUBCUTANEOUS
  Administered 2020-08-24: 2 [IU] via SUBCUTANEOUS
  Administered 2020-08-24: 1 [IU] via SUBCUTANEOUS
  Administered 2020-08-25: 2 [IU] via SUBCUTANEOUS
  Administered 2020-08-28: 1 [IU] via SUBCUTANEOUS
  Administered 2020-08-28: 2 [IU] via SUBCUTANEOUS
  Administered 2020-08-29 – 2020-08-30 (×5): 1 [IU] via SUBCUTANEOUS
  Administered 2020-08-31: 2 [IU] via SUBCUTANEOUS
  Administered 2020-08-31 – 2020-09-01 (×3): 1 [IU] via SUBCUTANEOUS

## 2020-08-14 MED ORDER — IMMUNE GLOBULIN (HUMAN) 10 GM/100ML IV SOLN
400.0000 mg/kg | INTRAVENOUS | Status: AC
  Administered 2020-08-14 – 2020-08-18 (×5): 30 g via INTRAVENOUS
  Filled 2020-08-14 (×2): qty 100
  Filled 2020-08-14: qty 200
  Filled 2020-08-14 (×2): qty 300
  Filled 2020-08-14: qty 200

## 2020-08-14 NOTE — Evaluation (Signed)
Occupational Therapy Evaluation Patient Details Name: Jacob Cameron MRN: [id number] DOB: 03-04-1956 Today's Date: 08/14/2020    History of Present Illness Pt is a 65 y.o. male presenting with R neck pain and radiculopathy down his R arm with bil leg pain, parasthesias, and weakness. Symptoms began in legs then arm then neck. MRI showing no acute change in head.   Clinical Impression   PTA, pt was living with his wife and son and was independent; enjoys going on walks and managing his garden. Pt currently requiring Min A for UB ADLs, Max A for LB ADLs, and Max A for functional transfer with RW. Pt presenting with decreased strength, balance, functional use of RUE (domiant hand), and activity tolerance. Pt very motivated and eager to participate in therapy. Pt would benefit from further acute OT to facilitate safe dc. Due to change in function and high motivation, recommend dc to CIR for intensive OT to optimize safety, independence with ADLs, and return to PLOF.     Follow Up Recommendations  CIR    Equipment Recommendations  3 in 1 bedside commode    Recommendations for Other Services PT consult     Precautions / Restrictions Precautions Precautions: Fall Precaution Comments:  Restrictions Weight Bearing Restrictions: No      Mobility Bed Mobility Overal bed mobility: Needs Assistance Bed Mobility: Supine to Sit;Sit to Supine     Supine to sit: Min guard;HOB elevated Sit to supine: Min guard   General bed mobility comments: Min Guard A for safety to transition to/from EOB    Transfers Overall transfer level: Needs assistance Equipment used: Rolling walker (2 wheeled) Transfers: Sit to/from Stand Sit to Stand: Max assist Stand pivot transfers: Max assist       General transfer comment: Max A for power up into standing. Cues for hand placement at RW and upright posture. Fatigues quickly    Balance Overall balance assessment: Needs assistance Sitting-balance support:  Feet supported;No upper extremity supported Sitting balance-Leahy Scale: Fair Sitting balance - Comments: Able to maintain static sitting without UE support as seen during UE testing Postural control: Other (comment) (anterior lean in standing) Standing balance support: Bilateral upper extremity supported;During functional activity Standing balance-Leahy Scale: Poor Standing balance comment: Mod-Max A for maintaining standing balance. Use or Rw for UE support                           ADL either performed or assessed with clinical judgement   ADL Overall ADL's : Needs assistance/impaired Eating/Feeding: Set up;Sitting   Grooming: Set up;Sitting   Upper Body Bathing: Minimal assistance;Sitting   Lower Body Bathing: Maximal assistance;Sit to/from stand   Upper Body Dressing : Minimal assistance;Sitting   Lower Body Dressing: Maximal assistance;Sit to/from stand   Toilet Transfer: Maximal assistance;RW (simulated in room)           Functional mobility during ADLs: Maximal assistance;Rolling walker (side steps only) General ADL Comments: Pt presenting with decreased strength, balance, functional use of RUE, and activity tolerance     Vision         Perception     Praxis      Pertinent Vitals/Pain Pain Assessment: 0-10 Pain Score: 8  Pain Location: R arm, bil legs Pain Descriptors / Indicators: Pins and needles Pain Intervention(s): Monitored during session;Limited activity within patient's tolerance;Repositioned     Hand Dominance Right   Extremity/Trunk Assessment Upper Extremity Assessment Upper Extremity Assessment: RUE deficits/detail RUE Deficits /  Details: Poor strength and coorindation. Decreased finger opposition and finger to nose. Poor grasp. Reporting "needle" feeling RUE Sensation:  ("needles") RUE Coordination: decreased fine motor;decreased gross motor   Lower Extremity Assessment Lower Extremity Assessment: Defer to PT evaluation RLE  Deficits / Details: MMT scores of the following, good strength but poor ability maintain contraction as muscle would contract release and repeat: hip flexion 4-, knee extension 5, knee flexion 4+, ankle dorsiflexion 4 RLE Sensation: decreased light touch (pins/needles throughout, worse from knees and below) RLE Coordination: decreased fine motor;decreased gross motor LLE Deficits / Details: MMT scores of the following, good strength but poor ability maintain contraction as muscle would contract release and repeat: hip flexion 4-, knee extension 5, knee flexion 4+, ankle dorsiflexion 4 LLE Sensation: decreased light touch (pins/needles throughout, worse from knees and below) LLE Coordination: decreased fine motor;decreased gross motor   Cervical / Trunk Assessment Cervical / Trunk Assessment: Normal   Communication Communication Communication: Prefers language other than English (Urdu [place]; understands basic English but Nurse, learning disability helpful for cuing)   Cognition Arousal/Alertness: Awake/alert Behavior During Therapy: WFL for tasks assessed/performed Overall Cognitive Status: Within Functional Limits for tasks assessed                                 General Comments: A&Ox4.   General Comments  Pt agreeable to session in English and declined translator    Exercises     Shoulder Instructions      Home Living Family/patient expects to be discharged to:: Private residence Living Arrangements: Spouse/significant other;Children (30 y.o. or older sons) Available Help at Discharge: Family;Available 24 hours/day Type of Home: House Home Access: Level entry     Home Layout: One level     Bathroom Shower/Tub: Chief Strategy Officer: Standard     Home Equipment: None          Prior Functioning/Environment Level of Independence: Independent        Comments: Pt does not drive, gets rides from son. Retired.        OT Problem List: Decreased  strength;Decreased range of motion;Decreased activity tolerance;Impaired balance (sitting and/or standing);Decreased cognition;Decreased safety awareness;Decreased knowledge of use of DME or AE;Decreased knowledge of precautions;Decreased coordination;Pain      OT Treatment/Interventions: Self-care/ADL training;Therapeutic exercise;Energy conservation;DME and/or AE instruction;Therapeutic activities;Patient/family education    OT Goals(Current goals can be found in the care plan section) Acute Rehab OT Goals Patient Stated Goal: to improve and eventually go home OT Goal Formulation: With patient Time For Goal Achievement: 08/28/20 Potential to Achieve Goals: Good  OT Frequency: Min 2X/week   Barriers to D/C:            Co-evaluation              AM-PAC OT "6 Clicks" Daily Activity     Outcome Measure Help from another person eating meals?: None Help from another person taking care of personal grooming?: A Little Help from another person toileting, which includes using toliet, bedpan, or urinal?: A Lot Help from another person bathing (including washing, rinsing, drying)?: A Lot Help from another person to put on and taking off regular upper body clothing?: A Little Help from another person to put on and taking off regular lower body clothing?: A Lot 6 Click Score: 16   End of Session Equipment Utilized During Treatment: Gait belt;Rolling walker Nurse Communication: Mobility status  Activity Tolerance: Patient tolerated  treatment well;Patient limited by fatigue Patient left: in bed;with call bell/phone within reach;with bed alarm set  OT Visit Diagnosis: Unsteadiness on feet (R26.81);Other abnormalities of gait and mobility (R26.89);Muscle weakness (generalized) (M62.81);Pain                Time: 0109-3235 OT Time Calculation (min): 20 min Charges:  OT General Charges $OT Visit: 1 Visit OT Evaluation $OT Eval Moderate Complexity: 1 Mod  December Hedtke MSOT,  OTR/L Acute Rehab Pager: [phone] Office: [phone]  Theodoro Grist Ludmila Ebarb 08/14/2020, 12:47 PM

## 2020-08-14 NOTE — Plan of Care (Signed)
?  Problem: Education: ?Goal: Knowledge of General Education information will improve ?Description: Including pain rating scale, medication(s)/side effects and non-pharmacologic comfort measures ?Outcome: Progressing ?  ?Problem: Health Behavior/Discharge Planning: ?Goal: Ability to manage health-related needs will improve ?Outcome: Progressing ?  ?Problem: Clinical Measurements: ?Goal: Ability to maintain clinical measurements within normal limits will improve ?Outcome: Progressing ?Goal: Will remain free from infection ?Outcome: Progressing ?Goal: Respiratory complications will improve ?Outcome: Progressing ?Goal: Cardiovascular complication will be avoided ?Outcome: Progressing ?  ?Problem: Activity: ?Goal: Risk for activity intolerance will decrease ?Outcome: Progressing ?  ?Problem: Coping: ?Goal: Level of anxiety will decrease ?Outcome: Progressing ?  ?Problem: Elimination: ?Goal: Will not experience complications related to bowel motility ?Outcome: Progressing ?Goal: Will not experience complications related to urinary retention ?Outcome: Progressing ?  ?Problem: Pain Managment: ?Goal: General experience of comfort will improve ?Outcome: Progressing ?  ?Problem: Safety: ?Goal: Ability to remain free from injury will improve ?Outcome: Progressing ?  ?Problem: Skin Integrity: ?Goal: Risk for impaired skin integrity will decrease ?Outcome: Progressing ?  ?

## 2020-08-14 NOTE — Progress Notes (Signed)
PROGRESS NOTE  Jacob Cameron  KXF:[id number] DOB: 01/01/1956 DOA: 08/12/2020 PCP: Patient, No Pcp Per   Brief Narrative: Jacob Cameron is a 65 y.o. male without medical history who presented with paresthesias in the right hand and feet with associated pain that has progressed over the previous few days now causing him to be unable to walk. MRI brain was nonacute, so he was admitted to [facility] for neurology evaluation on the advice of teleneurology with MRI C/T/L spine pending. Symptoms have not changed. PT and OT recommend CIR at this time.   Assessment & Plan: Principal Problem:   Paresthesias Active Problems:   Tobacco abuse  Distal polyneuropathy: Unclear etiology. Vitamin B12 level wnl. - MRI spine pending, will provide sedation for the patient.  - Neurology consulted for evaluation. D/w NP who reports they're considering LP and empiric IVIG.  - Therapy recommending CIR. Pt is currently severely debilitated by symptoms and would not be able to return home safely.  - Do not currently believe covid vaccination with J&J in April 2021 is contributing.   T2DM: HbA1c 7%, new diagnosis. Given lack of significant symptoms, do not believe this is likely to have been present long enough or severe enough to precipitate acute polyneuropathy. - Diabetes coordinator and dietitian consulted - Start metformin after we know contrasted exams will no longer be needed.  - Start sensitive SSI  Neutrophilic leukocytosis: Afebrile. No meningismus on exam.  - Continue monitoring off antimicrobial therapy. Check ESR, CRP.  Dyslipidemia: LDL 192.  - Check LFTs and start statin.   Tobacco use:  - Cessation counseling  Elevated TSH: Mildly elevated at 5.311. Unclear significance.  - Check free T3 and free T4  DVT prophylaxis: Lovenox Code Status: Full Family Communication: None at bedside, neurology speaking with Son Disposition Plan:  Status is: Observation  The patient will require care spanning > 2 midnights  and should be moved to inpatient because: Ongoing diagnostic testing needed not appropriate for outpatient work up and Inpatient level of care appropriate due to severity of illness  Dispo: The patient is from: Home              Anticipated d/c is to: Home              Anticipated d/c date is: 3 days              Patient currently is not medically stable to d/c.  Consultants:   Neurology  Procedures:   None  Antimicrobials:  None   Subjective: Numbness, "needling" in hand and feet up to calves bilaterally is constant, waxing/waning, never completely gone, and increasingly associated with weakness in legs making him max assist for standing. No dyspnea.  Objective: Vitals:   08/13/20 2329 08/14/20 0430 08/14/20 0816 08/14/20 1217  BP: 114/76 126/88 132/90 99/68  Pulse: 100 96 96 90  Resp: _0 Temp: 97.7 F (36.5 C) 97.7 F (36.5 C) 98.3 F (36.8 C) 98.1 F (36.7 C)  TempSrc: Oral Oral Oral Oral  SpO2: 96% 96% 98% 98%  Weight:      Height:        Intake/Output Summary (Last 24 hours) at 08/14/2020 1509 Last data filed at 08/13/2020 2346 Gross per 24 hour  Intake --  Output 400 ml  Net -400 ml   Filed Weights   08/12/20 2053  Weight: 80.3 kg    Gen: 64 y.o. male in no distress Pulm: Non-labored breathing room air. Clear to auscultation bilaterally.  CV: Regular rate and rhythm. No murmur, rub, or gallop. No JVD, no pedal edema. GI: Abdomen soft, non-tender, non-distended, with normoactive bowel sounds. No organomegaly or masses felt. Ext: Warm, no deformities Skin: No rashes, lesions or ulcers Neuro: Alert and oriented. Apraxia of right hand more than weakness, not significant dysmetria or dysdiadochokinesia. Cranial nerves intact. Reported abnormal sensation but intact to light touch throughout. No weakness of hip flexors, knee flexion/extension, or EHL bilaterally Psych: Judgement and insight appear normal. Mood & affect appropriate.   Data Reviewed: I  have personally reviewed following labs and imaging studies  CBC: Recent Labs  Lab 08/10/20 2120  WBC 16.4*  NEUTROABS 12.4*  HGB 15.5  HCT 46.8  MCV 84.3  PLT 585*   Basic Metabolic Panel: Recent Labs  Lab 08/10/20 2120  NA 134*  K 3.8  CL 99  CO2 23  GLUCOSE 113*  BUN 20  CREATININE 1.14  CALCIUM 9.5   GFR: Estimated Creatinine Clearance: 64.7 mL/min (by C-G formula based on SCr of 1.14 mg/dL). Liver Function Tests: No results for input(s): AST, ALT, ALKPHOS, BILITOT, PROT, ALBUMIN in the last 168 hours. No results for input(s): LIPASE, AMYLASE in the last 168 hours. No results for input(s): AMMONIA in the last 168 hours. Coagulation Profile: No results for input(s): INR, PROTIME in the last 168 hours. Cardiac Enzymes: Recent Labs  Lab 08/10/20 2120  CKTOTAL 241   BNP (last 3 results) No results for input(s): PROBNP in the last 8760 hours. HbA1C: Recent Labs    08/14/20 0452  HGBA1C 7.0*   CBG: No results for input(s): GLUCAP in the last 168 hours. Lipid Profile: Recent Labs    08/14/20 0452  CHOL 276*  HDL 33*  LDLCALC 192*  TRIG 253*  CHOLHDL 8.4   Thyroid Function Tests: Recent Labs    08/14/20 1257  TSH 5.311*   Anemia Panel: Recent Labs    08/14/20 1257  VITAMINB12 187   Urine analysis:    Component Value Date/Time   COLORURINE YELLOW 08/11/2020 0050   APPEARANCEUR CLEAR 08/11/2020 0050   LABSPEC 1.020 08/11/2020 0050   PHURINE 5.0 08/11/2020 0050   GLUCOSEU NEGATIVE 08/11/2020 0050   HGBUR LARGE (A) 08/11/2020 0050   BILIRUBINUR NEGATIVE 08/11/2020 0050   KETONESUR NEGATIVE 08/11/2020 0050   PROTEINUR 30 (A) 08/11/2020 0050   NITRITE NEGATIVE 08/11/2020 0050   LEUKOCYTESUR NEGATIVE 08/11/2020 0050   Recent Results (from the past 240 hour(s))  SARS CORONAVIRUS 2 (TAT 6-24 HRS) Nasopharyngeal Nasopharyngeal Swab     Status: None   Collection Time: 08/11/20  2:31 AM   Specimen: Nasopharyngeal Swab  Result Value Ref Range  Status   SARS Coronavirus 2 NEGATIVE NEGATIVE Final    Comment: (NOTE) SARS-CoV-2 target nucleic acids are NOT DETECTED.  The SARS-CoV-2 RNA is generally detectable in upper and lower respiratory specimens during the acute phase of infection. Negative results do not preclude SARS-CoV-2 infection, do not rule out co-infections with other pathogens, and should not be used as the sole basis for treatment or other patient management decisions. Negative results must be combined with clinical observations, patient history, and epidemiological information. The expected result is Negative.  Fact Sheet for Patients: SugarRoll.be  Fact Sheet for Healthcare Providers: https://www.woods-mathews.com/  This test is not yet approved or cleared by the [place] FDA and  has been authorized for detection and/or diagnosis of SARS-CoV-2 by FDA under an Emergency Use Authorization (EUA). This EUA will remain  in effect (meaning  this test can be used) for the duration of the COVID-19 declaration under Se ction 564(b)(1) of the Act, 21 U.S.C. section 360bbb-3(b)(1), unless the authorization is terminated or revoked sooner.  Performed at [facility], [place] [address] [place], [place] [address]   SARS Coronavirus 2 by RT PCR (hospital order, performed in [facility] hospital lab) Nasopharyngeal Nasopharyngeal Swab     Status: None   Collection Time: 08/13/20  1:50 AM   Specimen: Nasopharyngeal Swab  Result Value Ref Range Status   SARS Coronavirus 2 NEGATIVE NEGATIVE Final    Comment: (NOTE) SARS-CoV-2 target nucleic acids are NOT DETECTED.  The SARS-CoV-2 RNA is generally detectable in upper and lower respiratory specimens during the acute phase of infection. The lowest concentration of SARS-CoV-2 viral copies this assay can detect is 250 copies / mL. A negative result does not preclude SARS-CoV-2 infection and should not be used as the sole basis  for treatment or other patient management decisions.  A negative result may occur with improper specimen collection / handling, submission of specimen other than nasopharyngeal swab, presence of viral mutation(s) within the areas targeted by this assay, and inadequate number of viral copies (<250 copies / mL). A negative result must be combined with clinical observations, patient history, and epidemiological information.  Fact Sheet for Patients:   StrictlyIdeas.no  Fact Sheet for Healthcare Providers: BankingDealers.co.za  This test is not yet approved or  cleared by the [place] FDA and has been authorized for detection and/or diagnosis of SARS-CoV-2 by FDA under an Emergency Use Authorization (EUA).  This EUA will remain in effect (meaning this test can be used) for the duration of the COVID-19 declaration under Section 564(b)(1) of the Act, 21 U.S.C. section 360bbb-3(b)(1), unless the authorization is terminated or revoked sooner.  Performed at [facility], [address], [place], [place] [address]       Radiology Studies: MR BRAIN WO CONTRAST  Result Date: 08/13/2020 CLINICAL DATA:  Neuro deficit, acute stroke suspected. EXAM: MRI HEAD WITHOUT CONTRAST TECHNIQUE: Multiplanar, multiecho pulse sequences of the brain and surrounding structures were obtained without intravenous contrast. COMPARISON:  None. FINDINGS: Brain: No acute infarction, hemorrhage, hydrocephalus, extra-axial collection or mass lesion. Mild for age T2/FLAIR hyperintensities within the white matter, likely related to chronic microvascular ischemic disease. Mild diffuse cerebral atrophy. Vascular: Major arterial flow voids are maintained at the skull base. Skull and upper cervical spine: Normal marrow signal. Sinuses/Orbits: Scattered paranasal sinus mucosal thickening with air-fluid level in the left maxillary sinus. Other: Motion limited study.  IMPRESSION: 1. No evidence of acute intracranial abnormality on this motion limited exam. No acute infarct. 2. Nonspecific paranasal sinus disease with air-fluid level in the left maxillary sinus. Correlate with signs/symptoms of sinusitis. Electronically Signed   By: Margaretha Sheffield MD   On: 08/13/2020 17:32    Scheduled Meds: . enoxaparin (LOVENOX) injection  40 mg Subcutaneous Q24H  . LORazepam  1 mg Intravenous Once   Continuous Infusions: . sodium chloride 50 mL/hr at 08/14/20 1215  . Immune Globulin 10%       LOS: 0 days   Time spent: 35 minutes.  Patrecia Pour, MD [facility] [email or web address] 08/14/2020, 3:09 PM

## 2020-08-14 NOTE — Progress Notes (Signed)
Neurology Progress Note  S: Patient states he went to the doctor 4 days ago because of pain in his legs. He was given "a medicine" which did not help. He is unsure of the name. The next day, his calf pain and paraesthesias worsened by coming up the legs and involved the right arm. By the day after, he states he could not walk. Describes difficulty ambulating as not being able to support his weight. He denies urinary or fecal incontinence. No HA, double vision, or blurred vision. No dysphagia. Neck/right trapezius hurts a little. Never had any back trouble with discs. Never had spinal surgery. States his pain is mostly in the feet, but worse in the bilateral calves. States the paraesthesias are worse than the pain. His right hand is weak and he is having difficulty with grasping objects and coordination. He denies any sick contacts, cold symptoms, GI virus, or fever. He has been vaccinated for COVID with last vaccination April of 2021. He has never experienced anything like this before.   Patient prefers not to have MRIs spine. Explained sedation.  Patient gave permission to speak to son. Attempted to call, no answer. Later, spoke to son. Son agreed with above timeline of symptoms. Clarification on not being able to [facility] stated that patient's legs would no support him.   O: Current vital signs: BP 132/90 (BP Location: Right Arm)   Pulse 96   Temp 98.3 F (36.8 C) (Oral)   Resp 20   Ht 5\' 7"  (1.702 m)   Wt 80.3 kg   SpO2 98%   BMI 27.72 kg/m  Vital signs in last 24 hours: Temp:  [97.7 F (36.5 C)-98.7 F (37.1 C)] 98.3 F (36.8 C) (02/04 0816) Pulse Rate:  [82-100] 96 (02/04 0816) Resp:  [17-20] 20 (02/04 0816) BP: (100-154)/(76-118) 132/90 (02/04 0816) SpO2:  [95 %-98 %] 98 % (02/04 0816)  GENERAL: Awake, alert in NAD HEENT: Normocephalic and atraumatic LUNGS: Normal respiratory effort.  CV: RRR ABDOMEN: Soft, nontender Ext: warm  NEURO:  Mental Status:  AA&Ox3 Speech/Language: speech is fluent. Slight language barrier. Comprehension intact. Cranial Nerves:  II: PERRL. Visual fields full.  III, IV, VI: EOMI. Eyelids elevate symmetrically. No ptosis.  V: Sensation is intact to light touch and symmetrical to face.  VII: Smile is symmetrical.   VIII: hearing intact to voice. IX, X: Palate elevates symmetrically. Phonation is normal.  12-08-2000 shrug 5/5. XII: tongue is midline without fasciculations. Motor: 5/5 strength to LEs. 5/5 strength to triceps and biceps bilaterally. 5/5 strength to left grip. 4+/5 strength to right grip. Adductors/abductors 5/5.  Tone: is normal and bulk is normal Sensation- UEs Intact to light touch bilaterally but decreased on the right UE.  LEs decreased sensation to light touch from toes to thighs, bilaterally. Decreased to temperature in BLEs and RUE. Exttinction intact.  Coordination: FTN ataxic on the right.   DTRs: 2+ LUE, 0 to RUE and BLEs.  Gait- deferred-patient refused to stand  Medications  Current Facility-Administered Medications:  .  0.9 %  sodium chloride infusion, , Intravenous, Continuous, BW:[id number], MD, Last Rate: 50 mL/hr at 08/14/20 0610, Restarted at 08/14/20 0610 .  acetaminophen (TYLENOL) tablet 650 mg, 650 mg, Oral, Q4H PRN **OR** acetaminophen (TYLENOL) 160 MG/5ML solution 650 mg, 650 mg, Per Tube, Q4H PRN **OR** acetaminophen (TYLENOL) suppository 650 mg, 650 mg, Rectal, Q4H PRN, 10/12/20, MD .  enoxaparin (LOVENOX) injection 40 mg, 40 mg, Subcutaneous, Q24H, Jonah Blue, MD, 40 mg at 08/13/20  1503 .  HYDROcodone-acetaminophen (NORCO/VICODIN) 5-325 MG per tablet 2 tablet, 2 tablet, Oral, Q4H PRN, Jonah Blue, MD, 2 tablet at 08/14/20 [phone] .  LORazepam (ATIVAN) injection 1 mg, 1 mg, Intravenous, Once, Jonah Blue, MD .  ondansetron [facility]) injection 4 mg, 4 mg, Intravenous, Q6H PRN, Jonah Blue, MD, 4 mg at 08/14/20 4920 .  senna-docusate (Senokot-S) tablet 1  tablet, 1 tablet, Oral, QHS PRN, Jonah Blue, MD   Imaging Dr. Thomasena Edis reviewed MRI Brain which showed no acute abnormality.   Assessment: 65 yo male with onset of bilateral calf pain on 08/10/20 with progression to paresthesias to LEs from toes to knees and to RUE from triceps to hand on 08/11/20 and culminating in inability to ambulate on 08/12/20. He had been to [facility] on 08/10/20 for the pain and was given Advil and referred to PT. Upon continued worsening of symptoms, he came to [facility] ED on 2/3. Tele neurology was consulted. Given his onset of symptoms 4 days ago along with ascending paresthesias and areflexia, differential includes GBS. However, he has no weakness except right grip. Axonal polyneuropathies and CIDP are gradual progression, so these are less likely the etiology. AMAN was considered but this normally has no sensory involvement. He does not have autonomic or bulbar features on exam. His A1c is 7, but doubt this is diabetic neuropathy given acute onset of symptoms.   Impression: 1. 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  -Pt refused LP and per RN, is also refusing MRI.  -will treat empirically with IVIG x 5 days-ordered. -PT/OT -DM and cholesterol treatment per primary team. -EMG/NCS outpatient if he does not improve.   Addendum: We went back to see patient to speak about LP. We used interpretor service. Patient wanted to wait til later for LP because he did not feel well-tired and nauseated.   Pt seen by Jimmye Norman, MSN, APN-BC/Nurse Practitioner/Neuro and by MD. Plan discussed and agreed upon with MD.  Pager: [phone]

## 2020-08-14 NOTE — Evaluation (Signed)
Physical Therapy Evaluation Patient Details Name: Jacob Cameron MRN: [id number] DOB: 1956/02/03 Today's Date: 08/14/2020   History of Present Illness  Pt is a 65 y.o. male presenting with R neck pain and radiculopathy down his R arm with bil leg pain, parasthesias, and weakness. Symptoms began in legs then arm then neck. MRI showing no acute change in head.  Clinical Impression  Pt presents with condition above and deficits mentioned below, see PT Problem List. PTA, pt was independent with all functional mobility living with his wife and sons who can assist him as needed 24/7. Currently, he displays a significant decline in functional status, needing modA to come to stand with a RW and maxA to take only a few steps to the R to transfer to a recliner with a RW. Pt displays deficits in coordination, strength, sensation, and balance that place him at high risk for falls. Pt benefits from having a translator to ensure full understanding of cues and his situation. Bout of emesis at end of session, RN notified. Will continue to follow acutely. Pt would greatly benefit from intensive therapies in the CIR setting to maximize his independence and safety with all functional mobility as he is functioning significantly below his baseline, motivated to improve, and has great social support.    Follow Up Recommendations CIR;Supervision for mobility/OOB    Equipment Recommendations  Rolling walker with 5" wheels;3in1 (PT);Wheelchair (measurements PT);Wheelchair cushion (measurements PT)    Recommendations for Other Services Rehab consult     Precautions / Restrictions Precautions Precautions: Fall Precaution Comments: Translator helpful Restrictions Weight Bearing Restrictions: No      Mobility  Bed Mobility Overal bed mobility: Needs Assistance Bed Mobility: Supine to Sit     Supine to sit: Min guard;HOB elevated     General bed mobility comments: HOB elevated utilizing rails to come to EOB with min  guard. Cues for squaring hips with EOB as pt tends to push R hip more anterior to L.    Transfers Overall transfer level: Needs assistance Equipment used: Rolling walker (2 wheeled) Transfers: Sit to/from [employer] Sit to Stand: Mod assist Stand pivot transfers: Max assist       General transfer comment: Pt attempts to pull up on RW, needing cues to correct hand placement with modA and extra time to power up to stand. When powering up to stand he demonstrated poor coordination alternating knee flexion and extension and utlimately finishing with extension and anterior lean to gain his balance. When taking steps to R to transfer to recliner he required verbal and tactile cues to shift weight and sequence leg and RW movement, maxA.  Ambulation/Gait Ambulation/Gait assistance: Max assist Gait Distance (Feet): 3 Feet Assistive device: Rolling walker (2 wheeled) Gait Pattern/deviations: Step-through pattern;Decreased step length - right;Decreased step length - left;Decreased stride length;Decreased weight shift to right;Decreased weight shift to left Gait velocity: reduced Gait velocity interpretation: <1.31 ft/sec, indicative of household ambulator General Gait Details: Leans anteriorly, most likely to gain knee extension. Needs max cues and assistance to shift weight, manage RW, and sequence steps laterally to R to recliner.  Stairs            Wheelchair Mobility    Modified Rankin (Stroke Patients Only) Modified Rankin (Stroke Patients Only) Pre-Morbid Rankin Score: No symptoms Modified Rankin: Moderately severe disability     Balance Overall balance assessment: Needs assistance Sitting-balance support: Bilateral upper extremity supported;Feet supported Sitting balance-Leahy Scale: Poor Sitting balance - Comments: UE support sitting EOB  min guard for safety. Postural control: Other (comment) (anterior lean in standing) Standing balance support: Bilateral  upper extremity supported;During functional activity Standing balance-Leahy Scale: Poor Standing balance comment: Mod-maxA with standing mobility with bil UE on RW. Anterior lean, likely to gain knee extension.                             Pertinent Vitals/Pain Pain Assessment: 0-10 Pain Score: 8  Pain Location: R arm, bil legs Pain Descriptors / Indicators: Pins and needles Pain Intervention(s): Limited activity within patient's tolerance;Monitored during session;Repositioned;Patient requesting pain meds-RN notified    Home Living Family/patient expects to be discharged to:: Private residence Living Arrangements: Spouse/significant other;Children (30 y.o. or older sons) Available Help at Discharge: Family;Available 24 hours/day Type of Home: House Home Access: Level entry     Home Layout: One level Home Equipment: None      Prior Function Level of Independence: Independent         Comments: Pt does not drive, gets rides from son. Retired.     Hand Dominance   Dominant Hand: Right    Extremity/Trunk Assessment   Upper Extremity Assessment Upper Extremity Assessment: Defer to OT evaluation    Lower Extremity Assessment Lower Extremity Assessment: RLE deficits/detail;LLE deficits/detail RLE Deficits / Details: MMT scores of the following, good strength but poor ability maintain contraction as muscle would contract release and repeat: hip flexion 4-, knee extension 5, knee flexion 4+, ankle dorsiflexion 4 RLE Sensation: decreased light touch (pins/needles throughout, worse from knees and below) RLE Coordination: decreased fine motor;decreased gross motor LLE Deficits / Details: MMT scores of the following, good strength but poor ability maintain contraction as muscle would contract release and repeat: hip flexion 4-, knee extension 5, knee flexion 4+, ankle dorsiflexion 4 LLE Sensation: decreased light touch (pins/needles throughout, worse from knees and  below) LLE Coordination: decreased fine motor;decreased gross motor    Cervical / Trunk Assessment Cervical / Trunk Assessment: Normal  Communication   Communication: Prefers language other than English (Urdu [place]; understands basic English but Nurse, learning disability helpful for cuing)  Cognition Arousal/Alertness: Awake/alert Behavior During Therapy: WFL for tasks assessed/performed Overall Cognitive Status: Within Functional Limits for tasks assessed                                 General Comments: A&Ox4.      General Comments General comments (skin integrity, edema, etc.): Bout of emesis following transfer, RN notified    Exercises     Assessment/Plan    PT Assessment Patient needs continued PT services  PT Problem List Decreased strength;Decreased activity tolerance;Decreased balance;Decreased mobility;Decreased coordination;Decreased knowledge of use of DME;Decreased safety awareness;Decreased knowledge of precautions;Impaired sensation;Pain       PT Treatment Interventions DME instruction;Gait training;Functional mobility training;Therapeutic activities;Therapeutic exercise;Balance training;Neuromuscular re-education;Patient/family education    PT Goals (Current goals can be found in the Care Plan section)  Acute Rehab PT Goals Patient Stated Goal: to improve and eventually go home PT Goal Formulation: With patient Time For Goal Achievement: 08/28/20 Potential to Achieve Goals: Good    Frequency Min 3X/week   Barriers to discharge        Co-evaluation               AM-PAC PT "6 Clicks" Mobility  Outcome Measure Help needed turning from your back to your side while in a flat bed  without using bedrails?: A Little Help needed moving from lying on your back to sitting on the side of a flat bed without using bedrails?: A Little Help needed moving to and from a bed to a chair (including a wheelchair)?: A Lot Help needed standing up from a chair using  your arms (e.g., wheelchair or bedside chair)?: A Lot Help needed to walk in hospital room?: A Lot Help needed climbing 3-5 steps with a railing? : Total 6 Click Score: 13    End of Session Equipment Utilized During Treatment: Gait belt Activity Tolerance: Patient tolerated treatment well Patient left: in chair;with call bell/phone within reach;with chair alarm set Nurse Communication: Mobility status;Other (comment);Patient requests pain meds (emesis; pt agitation with IV) PT Visit Diagnosis: Unsteadiness on feet (R26.81);Other abnormalities of gait and mobility (R26.89);Muscle weakness (generalized) (M62.81);Difficulty in walking, not elsewhere classified (R26.2);Other symptoms and signs involving the nervous system (R29.898)    Time: 1194-1740 PT Time Calculation (min) (ACUTE ONLY): 33 min   Charges:   PT Evaluation $PT Eval Moderate Complexity: 1 Mod PT Treatments $Therapeutic Activity: 8-22 mins        Jacob Cameron, PT, DPT Acute Rehabilitation Services  Pager: [phone] Office: [phone]   Jewel Baize 08/14/2020, 12:15 PM

## 2020-08-14 NOTE — Progress Notes (Signed)
Inpatient Rehab Admissions Coordinator Note:   Per therapy recommendations, pt was screened for CIR candidacy by Estill Dooms, PT, DPT.  Noted pt is observation status at this time. Pt may not have the medical necessity to warrant an inpatient rehab stay if they remain observation. If pt were to qualify for inpatient status, AC will screen for candidacy.  Please contact me with questions.   Estill Dooms, PT, DPT [phone] 08/14/20 1:06 PM

## 2020-08-15 DIAGNOSIS — Z720 Tobacco use: Secondary | ICD-10-CM | POA: Diagnosis not present

## 2020-08-15 DIAGNOSIS — R202 Paresthesia of skin: Secondary | ICD-10-CM | POA: Diagnosis not present

## 2020-08-15 LAB — CBC WITH DIFFERENTIAL/PLATELET
Abs Immature Granulocytes: 0.12 10*3/uL — ABNORMAL HIGH (ref 0.00–0.07)
Basophils Absolute: 0 10*3/uL (ref 0.0–0.1)
Basophils Relative: 0 %
Eosinophils Absolute: 0.1 10*3/uL (ref 0.0–0.5)
Eosinophils Relative: 1 %
HCT: 43 % (ref 39.0–52.0)
Hemoglobin: 14 g/dL (ref 13.0–17.0)
Immature Granulocytes: 1 %
Lymphocytes Relative: 9 %
Lymphs Abs: 1.3 10*3/uL (ref 0.7–4.0)
MCH: 27.6 pg (ref 26.0–34.0)
MCHC: 32.6 g/dL (ref 30.0–36.0)
MCV: 84.8 fL (ref 80.0–100.0)
Monocytes Absolute: 1 10*3/uL (ref 0.1–1.0)
Monocytes Relative: 7 %
Neutro Abs: 12 10*3/uL — ABNORMAL HIGH (ref 1.7–7.7)
Neutrophils Relative %: 82 %
Platelets: 352 10*3/uL (ref 150–400)
RBC: 5.07 MIL/uL (ref 4.22–5.81)
RDW: 12.7 % (ref 11.5–15.5)
WBC: 14.5 10*3/uL — ABNORMAL HIGH (ref 4.0–10.5)
nRBC: 0 % (ref 0.0–0.2)

## 2020-08-15 LAB — COMPREHENSIVE METABOLIC PANEL
ALT: 17 U/L (ref 0–44)
AST: 16 U/L (ref 15–41)
Albumin: 3.1 g/dL — ABNORMAL LOW (ref 3.5–5.0)
Alkaline Phosphatase: 67 U/L (ref 38–126)
Anion gap: 10 (ref 5–15)
BUN: 21 mg/dL (ref 8–23)
CO2: 22 mmol/L (ref 22–32)
Calcium: 8.7 mg/dL — ABNORMAL LOW (ref 8.9–10.3)
Chloride: 98 mmol/L (ref 98–111)
Creatinine, Ser: 1.08 mg/dL (ref 0.61–1.24)
GFR, Estimated: >60 mL/min (ref >60)
Glucose, Bld: 136 mg/dL — ABNORMAL HIGH (ref 70–99)
Potassium: 3.5 mmol/L (ref 3.5–5.1)
Sodium: 130 mmol/L — ABNORMAL LOW (ref 135–145)
Total Bilirubin: 1.1 mg/dL (ref 0.3–1.2)
Total Protein: 7.4 g/dL (ref 6.5–8.1)

## 2020-08-15 LAB — GLUCOSE, CAPILLARY
Glucose-Capillary: 140 mg/dL — ABNORMAL HIGH (ref 70–99)
Glucose-Capillary: 140 mg/dL — ABNORMAL HIGH (ref 70–99)
Glucose-Capillary: 159 mg/dL — ABNORMAL HIGH (ref 70–99)
Glucose-Capillary: 144 mg/dL — ABNORMAL HIGH (ref 70–99)

## 2020-08-15 LAB — RPR: RPR Ser Ql: NONREACTIVE

## 2020-08-15 LAB — T3, FREE: T3, Free: 2.8 pg/mL (ref 2.0–4.4)

## 2020-08-15 MED ORDER — VITAMIN B-12 100 MCG PO TABS
100.0000 ug | ORAL_TABLET | Freq: Every day | ORAL | Status: DC
  Administered 2020-08-15 – 2020-08-17 (×3): 100 ug via ORAL
  Filled 2020-08-15 (×3): qty 1

## 2020-08-15 MED ORDER — ROSUVASTATIN CALCIUM 5 MG PO TABS
5.0000 mg | ORAL_TABLET | Freq: Every day | ORAL | Status: DC
  Administered 2020-08-15 – 2020-09-01 (×18): 5 mg via ORAL
  Filled 2020-08-15 (×18): qty 1

## 2020-08-15 NOTE — Progress Notes (Signed)
PROGRESS NOTE  Jacob Cameron  WIO:[id number] DOB: [birth year] DOA: 08/12/2020 PCP: Patient, No Pcp Per   Brief Narrative: Tanya Marvin is a 65 y.o. male without medical history, s/p J&J covid vaccine April 2021 who presented with paresthesias in the right hand and feet with associated pain that has progressed over the previous few days now causing him to be unable to walk. MRI brain was nonacute, so he was admitted to [facility] for neurology evaluation on the advice of teleneurology with MRI C/T/L spine pending. Neurology has initiated IVIG empirically. Symptoms are stable and severely disabling. PT and OT recommend CIR at this time.   Assessment & Plan: Principal Problem:   Paresthesias Active Problems:   Tobacco abuse   Polyneuropathy, idiopathic progressive  Distal polyneuropathy: Unclear etiology. Vitamin B12 level wnl at 187. CK wnl. ESR 20, CRP 1.6.  - Neurology directing management. Pt declined LP and MRI thus far. Continuing with IVIG for empiric Tx of GBS.   - Therapy recommending CIR. Pt is currently severely debilitated by symptoms and would not be able to return home safely. PM&R consulted.  T2DM: HbA1c 7%, new diagnosis. Given lack of significant symptoms, do not believe this is likely to have been present long enough or severe enough to precipitate acute polyneuropathy. - Diabetes coordinator and dietitian consulted - Start metformin after we know contrasted exams will no longer be needed.  - Start sensitive SSI, CBGs at inpatient goal thus far.   Neutrophilic leukocytosis: Afebrile. No meningismus on exam.  - Continue monitoring off antimicrobial therapy. ESR, CRP not indicative of severe inflammatory state.   Hyponatremia:  - Monitor  Dyslipidemia: LDL 192. LFTs wnl.  - Start rosuvastatin  Tobacco use:  - Cessation counseling  Elevated TSH: Mildly elevated at 5.311. T3, T4 wnl.  - Recheck in 4-6 weeks recommended  DVT prophylaxis: Lovenox Code Status: Full Family  Communication: None at bedside, neurology speaking with Son Disposition Plan:  Status is: Inpatient due to need for additional diagnostic testing and treatment with IV therapies due to severity of illness.   Dispo: The patient is from: Home              Anticipated d/c is to: CIR              Anticipated d/c date is: 3 days              Patient currently is not medically stable to d/c.  Consultants:   Neurology  Procedures:   None  Antimicrobials:  None   Subjective: Reports numbness/needles improved very slightly since yesterday, still can't walk. Eating ok. No new complaints.   Objective: Vitals:   08/15/20 0005 08/15/20 0428 08/15/20 0511 08/15/20 1128  BP: (!) 126/93 139/88 (!) 154/82 132/88  Pulse: 96 91 81 85  Resp: $Remo'18 18  18  'cXGfj$ Temp: 98.4 F (36.9 C) 98.5 F (36.9 C) 98.1 F (36.7 C) 97.9 F (36.6 C)  TempSrc: Oral Oral Oral Oral  SpO2: 95% 95% 94% 98%  Weight:      Height:        Intake/Output Summary (Last 24 hours) at 08/15/2020 1131 Last data filed at 08/14/2020 2156 Gross per 24 hour  Intake 240 ml  Output --  Net 240 ml   Filed Weights   08/12/20 2053  Weight: 80.3 kg   Gen: 65 y.o. male in no distress Pulm: Nonlabored breathing room air. Clear. CV: Regular rate and rhythm. No murmur, rub, or gallop. No JVD, no dependent  edema. GI: Abdomen soft, non-tender, non-distended, with normoactive bowel sounds.  Ext: Warm, no deformities Skin: No rashes, lesions or ulcers on visualized skin. Neuro: Alert and oriented. Cranial nerves intact, peripheral nerve testing appears intact for sensory and motor function grossly. Psych: Judgement and insight appear fair. Mood euthymic & affect congruent. Behavior is appropriate.  Data Reviewed: I have personally reviewed following labs and imaging studies  CBC: Recent Labs  Lab 08/10/20 2120 08/15/20 0245  WBC 16.4* 14.5*  NEUTROABS 12.4* 12.0*  HGB 15.5 14.0  HCT 46.8 43.0  MCV 84.3 84.8  PLT 412* 638    Basic Metabolic Panel: Recent Labs  Lab 08/10/20 2120 08/15/20 0245  NA 134* 130*  K 3.8 3.5  CL 99 98  CO2 23 22  GLUCOSE 113* 136*  BUN 20 21  CREATININE 1.14 1.08  CALCIUM 9.5 8.7*   GFR: Estimated Creatinine Clearance: 68.3 mL/min (by C-G formula based on SCr of 1.08 mg/dL). Liver Function Tests: Recent Labs  Lab 08/15/20 0245  AST 16  ALT 17  ALKPHOS 67  BILITOT 1.1  PROT 7.4  ALBUMIN 3.1*   No results for input(s): LIPASE, AMYLASE in the last 168 hours. No results for input(s): AMMONIA in the last 168 hours. Coagulation Profile: No results for input(s): INR, PROTIME in the last 168 hours. Cardiac Enzymes: Recent Labs  Lab 08/10/20 2120  CKTOTAL 241   BNP (last 3 results) No results for input(s): PROBNP in the last 8760 hours. HbA1C: Recent Labs    08/14/20 0452  HGBA1C 7.0*   CBG: Recent Labs  Lab 08/14/20 1625 08/15/20 0729  GLUCAP 138* 140*   Lipid Profile: Recent Labs    08/14/20 0452  CHOL 276*  HDL 33*  LDLCALC 192*  TRIG 253*  CHOLHDL 8.4   Thyroid Function Tests: Recent Labs    08/14/20 1257 08/14/20 1531  TSH 5.311*  --   FREET4  --  0.84  T3FREE 2.8  --    Anemia Panel: Recent Labs    08/14/20 1257  VITAMINB12 187   Urine analysis:    Component Value Date/Time   COLORURINE YELLOW 08/11/2020 0050   APPEARANCEUR CLEAR 08/11/2020 0050   LABSPEC 1.020 08/11/2020 0050   PHURINE 5.0 08/11/2020 0050   GLUCOSEU NEGATIVE 08/11/2020 0050   HGBUR LARGE (A) 08/11/2020 0050   BILIRUBINUR NEGATIVE 08/11/2020 0050   KETONESUR NEGATIVE 08/11/2020 0050   PROTEINUR 30 (A) 08/11/2020 0050   NITRITE NEGATIVE 08/11/2020 0050   LEUKOCYTESUR NEGATIVE 08/11/2020 0050   Recent Results (from the past 240 hour(s))  SARS CORONAVIRUS 2 (TAT 6-24 HRS) Nasopharyngeal Nasopharyngeal Swab     Status: None   Collection Time: 08/11/20  2:31 AM   Specimen: Nasopharyngeal Swab  Result Value Ref Range Status   SARS Coronavirus 2 NEGATIVE  NEGATIVE Final    Comment: (NOTE) SARS-CoV-2 target nucleic acids are NOT DETECTED.  The SARS-CoV-2 RNA is generally detectable in upper and lower respiratory specimens during the acute phase of infection. Negative results do not preclude SARS-CoV-2 infection, do not rule out co-infections with other pathogens, and should not be used as the sole basis for treatment or other patient management decisions. Negative results must be combined with clinical observations, patient history, and epidemiological information. The expected result is Negative.  Fact Sheet for Patients: SugarRoll.be  Fact Sheet for Healthcare Providers: https://www.woods-mathews.com/  This test is not yet approved or cleared by the [place] FDA and  has been authorized for detection and/or diagnosis  of SARS-CoV-2 by FDA under an Emergency Use Authorization (EUA). This EUA will remain  in effect (meaning this test can be used) for the duration of the COVID-19 declaration under Se ction 564(b)(1) of the Act, 21 U.S.C. section 360bbb-3(b)(1), unless the authorization is terminated or revoked sooner.  Performed at [facility], [place] [address] [place], [place] [address]   SARS Coronavirus 2 by RT PCR (hospital order, performed in [facility] hospital lab) Nasopharyngeal Nasopharyngeal Swab     Status: None   Collection Time: 08/13/20  1:50 AM   Specimen: Nasopharyngeal Swab  Result Value Ref Range Status   SARS Coronavirus 2 NEGATIVE NEGATIVE Final    Comment: (NOTE) SARS-CoV-2 target nucleic acids are NOT DETECTED.  The SARS-CoV-2 RNA is generally detectable in upper and lower respiratory specimens during the acute phase of infection. The lowest concentration of SARS-CoV-2 viral copies this assay can detect is 250 copies / mL. A negative result does not preclude SARS-CoV-2 infection and should not be used as the sole basis for treatment or other patient  management decisions.  A negative result may occur with improper specimen collection / handling, submission of specimen other than nasopharyngeal swab, presence of viral mutation(s) within the areas targeted by this assay, and inadequate number of viral copies (<250 copies / mL). A negative result must be combined with clinical observations, patient history, and epidemiological information.  Fact Sheet for Patients:   StrictlyIdeas.no  Fact Sheet for Healthcare Providers: BankingDealers.co.za  This test is not yet approved or  cleared by the [place] FDA and has been authorized for detection and/or diagnosis of SARS-CoV-2 by FDA under an Emergency Use Authorization (EUA).  This EUA will remain in effect (meaning this test can be used) for the duration of the COVID-19 declaration under Section 564(b)(1) of the Act, 21 U.S.C. section 360bbb-3(b)(1), unless the authorization is terminated or revoked sooner.  Performed at [facility], [address], [place], [place] [address]       Radiology Studies: MR BRAIN WO CONTRAST  Result Date: 08/13/2020 CLINICAL DATA:  Neuro deficit, acute stroke suspected. EXAM: MRI HEAD WITHOUT CONTRAST TECHNIQUE: Multiplanar, multiecho pulse sequences of the brain and surrounding structures were obtained without intravenous contrast. COMPARISON:  None. FINDINGS: Brain: No acute infarction, hemorrhage, hydrocephalus, extra-axial collection or mass lesion. Mild for age T2/FLAIR hyperintensities within the white matter, likely related to chronic microvascular ischemic disease. Mild diffuse cerebral atrophy. Vascular: Major arterial flow voids are maintained at the skull base. Skull and upper cervical spine: Normal marrow signal. Sinuses/Orbits: Scattered paranasal sinus mucosal thickening with air-fluid level in the left maxillary sinus. Other: Motion limited study. IMPRESSION: 1. No evidence of acute  intracranial abnormality on this motion limited exam. No acute infarct. 2. Nonspecific paranasal sinus disease with air-fluid level in the left maxillary sinus. Correlate with signs/symptoms of sinusitis. Electronically Signed   By: Margaretha Sheffield MD   On: 08/13/2020 17:32    Scheduled Meds: . enoxaparin (LOVENOX) injection  40 mg Subcutaneous Q24H  . insulin aspart  0-6 Units Subcutaneous TID WC  . LORazepam  1 mg Intravenous Once  . rosuvastatin  5 mg Oral Daily   Continuous Infusions: . Immune Globulin 10% 30 g (08/14/20 1837)     LOS: 1 day   Time spent: 35 minutes.  Patrecia Pour, MD [facility] [email or web address] 08/15/2020, 11:31 AM

## 2020-08-15 NOTE — Progress Notes (Signed)
Nutrition Brief Note RD working remotely.  RD received consult for diet education regarding new diagnosis of DM.  Per review of chart patient's preferred language is Urdu and requires interpreter. Utilized interpreter line [phone] (number incorrect in chart) and able to connect with Urdu interpreter. Several attempts were made by interpreter to call patient's room phone but patient was unable to answer. Per RN patient does speak some English. RD also attempted to call into patient's room later in the day and did not receive an answer. Will attach DM handout to discharge instructions (handout not available in Urdu so will attach [place]). Per review of chart patient is vegetarian. No meal documentation available in chart but per MD note patient is eating okay.  Felix Pacini, MS, RD, LDN Pager number available on Amion

## 2020-08-15 NOTE — Discharge Instructions (Signed)
Carbohydrate Counting for People with Diabetes Foods with carbohydrates make your blood glucose level go up. Learning how to count carbohydrates can help you control your blood glucose levels. First, identify the foods you eat that contain carbohydrates. Then, using the Foods with Carbohydrates chart, determine about how much carbohydrates are in your meals and snacks. Make sure you are eating foods with fiber, protein, and healthy fat along with your carbohydrate foods. Foods with Carbohydrates The following table shows carbohydrate foods that have about 15 grams of carbohydrate each. Using measuring cups, spoons, or a food scale when you first begin learning about carbohydrate counting can help you learn about the portion sizes you typically eat.  The following foods have 15 grams carbohydrate each:  Grains  1 slice bread (1 ounce)  1 small tortilla (6-inch size)   large bagel (1 ounce)  1/3 cup pasta or rice (cooked)   hamburger or hot dog bun ( ounce)   cup cooked cereal   to  cup ready-to-eat cereal  2 taco shells (5-inch size) Fruit  1 small fresh fruit ( to 1 cup)   medium banana  17 small grapes (3 ounces)  1 cup melon or berries   cup canned or frozen fruit  2 tablespoons dried fruit (blueberries, cherries, cranberries, raisins)   cup unsweetened fruit juice  Starchy Vegetables   cup cooked beans, peas, corn, potatoes/sweet potatoes   large baked potato (3 ounces)  1 cup acorn or butternut squash Snack Foods  3 to 6 crackers  8 potato chips or 13 tortilla chips ( ounce to 1 ounce)  3 cups popped popcorn  Dairy  3/4 cup (6 ounces) nonfat plain yogurt, or yogurt with sugar-free sweetener  1 cup milk  1 cup plain rice, soy, coconut or flavored almond milk Sweets and Desserts   cup ice cream or frozen yogurt  1 tablespoon jam, jelly, pancake syrup, table sugar, or honey  2 tablespoons light pancake syrup  1 inch square of frosted cake  or 2 inch square of unfrosted cake  2 small cookies (2/3ounce each) or  large cookie  Sometimes you'll have to estimate carbohydrate amounts if you don't know the exact recipe. One cup of mixed foods like soups can have 1 to 2 carbohydrate servings, while some casseroles might have 2 or more servings of carbohydrate. Foods that have less than 20 calories in each serving can be counted as "free" foods. Count 1 cup raw vegetables, or  cup cooked non-starchy vegetables as "free" foods. If you eat 3 or more servings at one meal, then count them as 1 carbohydrate serving.  Foods without Carbohydrates  Not all foods contain carbohydrates. Meat, some dairy, fats, non-starchy vegetables, and many beverages don't contain carbohydrate. So when you count carbohydrates, you can generally exclude chicken, pork, beef, fish, seafood, eggs, tofu, cheese, butter, sour cream, avocado, nuts, seeds, olives, mayonnaise, water, black coffee, unsweetened tea, and zero-calorie drinks. Vegetables with no or low carbohydrate include green beans, cauliflower, tomatoes, and onions. How much carbohydrate should I eat at each meal?  Carbohydrate counting can help you plan your meals and manage your weight. Following are some starting points for carbohydrate intake at each meal. Work with your registered dietitian nutritionist to find the best range that works for your blood glucose and weight.   To Lose Weight To Maintain Weight  Women 2 - 3 carb servings 3 - 4 carb servings  Men 3 - 4 carb servings 4 - 5 carb  servings   Checking your blood glucose after meals will help you know if you need to adjust the timing, type, or number of carbohydrate servings in your meal plan. Achieve and keep a healthy body weight by balancing your food intake and physical activity.  Tips How should I plan my meals?  Plan for half the food on your plate to include non-starchy vegetables, like salad greens, broccoli, or carrots. Try to eat 3 to 5  servings of non-starchy vegetables every day. Have a protein food at each meal. Protein foods include chicken, fish, meat, eggs, or beans (note that beans contain carbohydrate). These two food groups (non-starchy vegetables and proteins) are low in carbohydrate. If you fill up your plate with these foods, you will eat less carbohydrate but still fill up your stomach. Try to limit your carbohydrate portion to  of the plate.  What fats are healthiest to eat?  Diabetes increases risk for heart disease. To help protect your heart, eat more healthy fats, such as olive oil, nuts, and avocado. Eat less saturated fats like butter, cream, and high-fat meats, like bacon and sausage. Avoid trans fats, which are in all foods that list "partially hydrogenated oil" as an ingredient.  What should I drink?  Choose drinks that are not sweetened with sugar. The healthiest choices are water, carbonated or seltzer waters, and tea and coffee without added sugars.   Sweet drinks will make your blood glucose go up very quickly. One serving of soda or energy drink is  cup. It is best to drink these beverages only if your blood glucose is low.   Artificially sweetened, or diet drinks, typically do not increase your blood glucoseif they have zero calories in them. Read labels of beverages, as some diet drinks do have carbohydrateand will raise your blood glucose.   Label Reading Tips Read Nutrition Facts labels to find out how many grams of carbohydrate are in a food you want to eat. Don't forget: sometimes serving sizes on the label aren't the same as how much food you are going to eat, so you may need to calculate how much carbohydrate is in the food you are serving yourself.    Carbohydrate Counting for People with Diabetes Vegan Sample 1-Day Menu  Breakfast 1 cup cooked oatmeal (2 carbohydrate servings)   cup blueberries (1 carbohydrate serving)  2 tablespoons flaxseeds  1 cup soymilk fortified with calcium and  vitamin D  1 cup coffee  Lunch 2 slices whole wheat bread (2 carbohydrate servings)   cup baked tofu   cup lettuce 2 slices tomato  2 slices avocado  cup baby carrots  1 orange (1 carbohydrate serving)  1 cup soymilk fortified with calcium and vitamin D   Evening Meal Burrito made with: 1 6-inch corn tortilla (1 carbohydrate serving)  1 cup refried vegetarian beans (2 carbohydrate servings)   cup chopped tomatoes  cup lettuce  cup salsa 1/3 cup brown rice (1 carbohydrate serving)  1 tablespoon olive oil for rice   cup zucchini   Evening Snack 6 small whole grain crackers (1 carbohydrate serving)  2 apricots ( carbohydrate serving)  cup unsalted peanuts      Carbohydrate Counting for People with Diabetes Vegetarian (Lacto-Ovo) Sample 1-Day Menu  Breakfast 1 cup cooked oatmeal (2 carbohydrate servings)   cup blueberries (1 carbohydrate serving) 2 tablespoons flaxseeds 1 egg 1 cup 1% milk (1 carbohydrate serving) 1 cup coffee  Lunch 2 slices whole wheat bread (2 carbohydrate servings)  2 ounces low-fat cheese   cup lettuce  2 slices tomato  2 slices avocado  cup baby carrots  1 orange (1 carbohydrate serving) 1 cup unsweetened tea  Evening Meal Burrito made with: 1 6-inch corn tortilla (1 carbohydrate serving)   cup refried vegetarian beans (1 carbohydrate serving)   cup tomatoes  cup lettuce   cup salsa 1/3 cup brown rice (1 carbohydrate serving) 1 tablespoon olive oil for rice   cup zucchini  1 cup 1% milk (1 carbohydrate serving)  Evening Snack 6 small whole grain crackers (1 carbohydrate serving) 2 apricots ( carbohydrate serving)   cup unsalted peanuts

## 2020-08-15 NOTE — Progress Notes (Addendum)
Neurology Progress Note  S: patient states all of his same symptoms of pain/numbness are still the same. States he still can not walk. He has only had IVIG x 1 dose thusfar. His biggest complaint is n/v.   O: Current vital signs: BP 132/88 (BP Location: Left Arm)   Pulse 85   Temp 97.9 F (36.6 C) (Oral)   Resp 18   Ht 5\' 7"  (1.702 m)   Wt 80.3 kg   SpO2 98%   BMI 27.72 kg/m  Vital signs in last 24 hours: Temp:  [97.9 F (36.6 C)-98.5 F (36.9 C)] 97.9 F (36.6 C) (02/05 1128) Pulse Rate:  [81-96] 85 (02/05 1128) Resp:  [17-18] 18 (02/05 1128) BP: (115-154)/(82-94) 132/88 (02/05 1128) SpO2:  [94 %-99 %] 98 % (02/05 1128)  GENERAL: Awake, alert in NAD HEENT: Normocephalic and atraumatic LUNGS: Normal respiratory effort.  CV: RRR ABDOMEN: Soft Ext: warm. He freely moves all extremities    NEURO:  Mental Status: AA&Ox3  Speech/Language: speech is fluent.  Comprehension intact.  Cranial Nerves:  II: PERRL. Visual fields full.   III, IV, VI: EOMI. Eyelids elevate symmetrically.  V: Sensation is intact to light touch and symmetrical to face.  VII: Smile is symmetrical. Able to puff cheeks and raise eyebrows.  VIII: hearing intact to voice. IX, X: Palate elevates symmetrically. Phonation is normal.  02[birth year]1990 shrug 5/5. XII: tongue is midline without fasciculations. Motor: 5/5 strength to all muscle groups tested. No weakness noted today to right hand grip.  Tone: is normal and bulk is normal Sensation- Intact to light touch bilaterally but decreased below knees and in right arm up to triceps. Extinction intact.  DTRs: 2+ LUE. 0 to RUE and patellas.  Gait- deferred  Medications  Current Facility-Administered Medications:  .  acetaminophen (TYLENOL) tablet 650 mg, 650 mg, Oral, Q4H PRN **OR** acetaminophen (TYLENOL) 160 MG/5ML solution 650 mg, 650 mg, Per Tube, Q4H PRN **OR** acetaminophen (TYLENOL) suppository 650 mg, 650 mg, Rectal, Q4H PRN, OT:[id number], MD .   enoxaparin (LOVENOX) injection 40 mg, 40 mg, Subcutaneous, Q24H, Jonah Blue, MD, 40 mg at 08/14/20 1620 .  HYDROcodone-acetaminophen (NORCO/VICODIN) 5-325 MG per tablet 2 tablet, 2 tablet, Oral, Q4H PRN, 10/12/20, MD, 2 tablet at 08/14/20 2153 .  Immune Globulin 10% (PRIVIGEN) IV infusion 30 g, 400 mg/kg, Intravenous, Q24 Hr x 5, Kirby-Graham, 2154, NP, Last Rate: 24.1 mL/hr at 08/14/20 1837, 30 g at 08/14/20 1837 .  insulin aspart (novoLOG) injection 0-6 Units, 0-6 Units, Subcutaneous, TID WC, 10/12/20 B, MD .  LORazepam (ATIVAN) injection 1 mg, 1 mg, Intravenous, Once, Hazeline Junker, MD .  ondansetron [facility]) injection 4 mg, 4 mg, Intravenous, Q6H PRN, [facility], MD, 4 mg at 08/14/20 2146 .  rosuvastatin (CRESTOR) tablet 5 mg, 5 mg, Oral, Daily, 2147 B, MD .  senna-docusate (Senokot-S) tablet 1 tablet, 1 tablet, Oral, QHS PRN, Hazeline Junker, MD   Labs Vit [phone] CBG 140 TSH 5.311 T3 and free T4 normal HgbA1c 7%  Imaging 08/13/20 MRI Brain showed  1. No evidence of acute intracranial abnormality on this motion limited exam. No acute infarct. 2. Nonspecific paranasal sinus disease with air-fluid level in the left maxillary sinus. Correlate with signs/symptoms of sinusitis.  A/P: 1. Ascending polyneuropathy of unknown type or etiology. His findings do not represent any typical neuropathy/myelopathy type syndrome, not even GBS. His strength is normal on exam and he is able to move his extremities around freely. But, per  PT notes, he is max assist to walk. CIR is plan after discharge.  2. B12 low normal. Given symptoms, will go ahead and place on B12 supplementation.   He refused spinal MRI and LP yesterday. Today, he states he may be open to  LP. However, after discussing with Dr. Thomasena Edis, we will have our night MD (who speaks the same native language of patient) to render a second opinion on exam/findings in case language barrier/interpretor errors could  be a factor. For now, await serum antibody studies and continue empiric IVIG. LP depending on 2nd opinion of night MD.   Jimmye Norman, NP/neurology. Plan/findings discussed with Dr. Thomasena Edis who agrees.  Pager: [phone]

## 2020-08-16 ENCOUNTER — Inpatient Hospital Stay (HOSPITAL_COMMUNITY): Payer: Medicare Other

## 2020-08-16 DIAGNOSIS — Z720 Tobacco use: Secondary | ICD-10-CM | POA: Diagnosis not present

## 2020-08-16 DIAGNOSIS — R202 Paresthesia of skin: Secondary | ICD-10-CM | POA: Diagnosis not present

## 2020-08-16 LAB — GLUCOSE, CAPILLARY
Glucose-Capillary: 137 mg/dL — ABNORMAL HIGH (ref 70–99)
Glucose-Capillary: 134 mg/dL — ABNORMAL HIGH (ref 70–99)
Glucose-Capillary: 123 mg/dL — ABNORMAL HIGH (ref 70–99)
Glucose-Capillary: 101 mg/dL — ABNORMAL HIGH (ref 70–99)

## 2020-08-16 MED ORDER — OXYCODONE HCL 5 MG PO TABS
5.0000 mg | ORAL_TABLET | ORAL | Status: DC | PRN
  Administered 2020-08-16 – 2020-08-31 (×15): 5 mg via ORAL
  Filled 2020-08-16 (×14): qty 1

## 2020-08-16 MED ORDER — GABAPENTIN 100 MG PO CAPS
100.0000 mg | ORAL_CAPSULE | Freq: Three times a day (TID) | ORAL | Status: DC
  Administered 2020-08-16 – 2020-08-22 (×19): 100 mg via ORAL
  Filled 2020-08-16 (×19): qty 1

## 2020-08-16 MED ORDER — MORPHINE SULFATE (PF) 2 MG/ML IV SOLN
1.0000 mg | Freq: Once | INTRAVENOUS | Status: AC | PRN
  Administered 2020-08-16: 1 mg via INTRAVENOUS
  Filled 2020-08-16: qty 1

## 2020-08-16 MED ORDER — LIVING WELL WITH DIABETES BOOK
Freq: Once | Status: AC
  Filled 2020-08-16: qty 1

## 2020-08-16 MED ORDER — LORAZEPAM 2 MG/ML IJ SOLN
1.0000 mg | Freq: Once | INTRAMUSCULAR | Status: AC | PRN
  Administered 2020-08-16: 1 mg via INTRAVENOUS
  Filled 2020-08-16: qty 1

## 2020-08-16 NOTE — Progress Notes (Addendum)
PROGRESS NOTE  Jacob Cameron  KDX:[id number] DOB: 25-Dec-1955 DOA: 08/12/2020 PCP: Patient, No Pcp Per   Brief Narrative: Jacob Cameron is a 65 y.o. male without medical history, s/p J&J covid vaccine April 2021 who presented with paresthesias in the right hand and feet with associated pain that has progressed over the previous few days now causing him to be unable to walk. MRI brain was nonacute, so he was admitted to [facility] for neurology evaluation on the advice of teleneurology with MRI C/T/L spine pending. Neurology has initiated IVIG empirically. Symptoms are stable and severely disabling. PT and OT recommend CIR at this time. Pt now reporting he's amenable to MRI and LP.  Assessment & Plan: Principal Problem:   Paresthesias Active Problems:   Tobacco abuse   Polyneuropathy, idiopathic progressive  Distal polyneuropathy: Unclear etiology. Vitamin B12 level wnl at 187. CK wnl. ESR 20, CRP 1.6.  - Neurology directing management: Continuing with IVIG daily x5 days for empiric Tx - B12 supplementation due to low-normal level. - Pt reports he's willing to try MRI. D/w RN, neuro, and reordered prn ativan to assist with anxiety/claustrophobia.  - Decision Re: LP per neuro. - Will add gabapentin low dose.  - Therapy recommending CIR. Pt is currently severely debilitated by symptoms and would not be able to return home safely. PM&R consulted.  Nausea, vomiting, poor appetite: Abdomen is benign. Having BM's. No bleeding.  - prn antiemetic added.  - ?if related to narcotic pain medication. Will change hydrocodone > oxycodone, monitor exam closely.   T2DM: HbA1c 7%, new diagnosis. Given lack of significant symptoms, do not believe this is likely to have been present long enough or severe enough to precipitate acute polyneuropathy. - Diabetes coordinator and dietitian consulted - Start metformin after we know contrasted exams will no longer be needed and after GI symptoms have resolved.  - Start sensitive  SSI, CBGs at inpatient goal thus far, though very little po intake.  Neutrophilic leukocytosis: Afebrile. No meningismus on exam. WBC coming down. - Continue monitoring off antimicrobial therapy. Remains afebrile. ESR, CRP not indicative of severe inflammatory state.   Hyponatremia:  - Monitor  Dyslipidemia: LDL 192. LFTs wnl.  - Started rosuvastatin  Tobacco use:  - Cessation counseling  Elevated TSH: Mildly elevated at 5.311. T3, T4 wnl.  - Recheck in 4-6 weeks recommended  DVT prophylaxis: Lovenox Code Status: Full Family Communication: None at bedside  Disposition Plan:  Status is: Inpatient due to need for additional diagnostic testing and treatment with IV therapies due to severity of illness.   Dispo: The patient is from: Home              Anticipated d/c is to: CIR              Anticipated d/c date is: 3 days              Patient currently is not medically stable to d/c.  Consultants:   Neurology  Procedures:   None  Antimicrobials:  None   Subjective: No real change in weakness or sensory changes in legs and right hand. Having poor appetite and an episode of vomiting associated with nausea  Objective: Vitals:   08/15/20 2334 08/16/20 0423 08/16/20 0725 08/16/20 0800  BP: (!) 145/92 (!) 137/94 (!) 149/103 (!) 140/92  Pulse: 91 85 91   Resp: $Remo'19 17 18   'IiRjQ$ Temp: 97.7 F (36.5 C) 98.5 F (36.9 C) 97.7 F (36.5 C)   TempSrc: Oral Oral Oral  SpO2: 99% 97% 95%   Weight:      Height:        Intake/Output Summary (Last 24 hours) at 08/16/2020 0817 Last data filed at 08/15/2020 1800 Gross per 24 hour  Intake 480 ml  Output -  Net 480 ml   Filed Weights   08/12/20 2053  Weight: 80.3 kg   Gen: 65 y.o. male in no distress Pulm: Nonlabored breathing room air. Clear. CV: Regular rate and rhythm. No murmur, rub, or gallop. No JVD, no dependent edema. GI: Abdomen soft, non-tender, non-distended, with normoactive bowel sounds.  Ext: Warm, no  deformities Skin: No rashes, lesions or ulcers on visualized skin. Neuro: Alert and oriented. Abnormal sensation reported throughout lower legs, not ascending from prior exams, also present in R > L hand, arms. Discoordination of right > left hands as well. Psych: Judgement and insight appear fair. Mood euthymic & affect congruent. Behavior is appropriate.    Data Reviewed: I have personally reviewed following labs and imaging studies  CBC: Recent Labs  Lab 08/10/20 2120 08/15/20 0245  WBC 16.4* 14.5*  NEUTROABS 12.4* 12.0*  HGB 15.5 14.0  HCT 46.8 43.0  MCV 84.3 84.8  PLT 412* 858   Basic Metabolic Panel: Recent Labs  Lab 08/10/20 2120 08/15/20 0245  NA 134* 130*  K 3.8 3.5  CL 99 98  CO2 23 22  GLUCOSE 113* 136*  BUN 20 21  CREATININE 1.14 1.08  CALCIUM 9.5 8.7*   GFR: Estimated Creatinine Clearance: 68.3 mL/min (by C-G formula based on SCr of 1.08 mg/dL). Liver Function Tests: Recent Labs  Lab 08/15/20 0245  AST 16  ALT 17  ALKPHOS 67  BILITOT 1.1  PROT 7.4  ALBUMIN 3.1*   No results for input(s): LIPASE, AMYLASE in the last 168 hours. No results for input(s): AMMONIA in the last 168 hours. Coagulation Profile: No results for input(s): INR, PROTIME in the last 168 hours. Cardiac Enzymes: Recent Labs  Lab 08/10/20 2120  CKTOTAL 241   BNP (last 3 results) No results for input(s): PROBNP in the last 8760 hours. HbA1C: Recent Labs    08/14/20 0452  HGBA1C 7.0*   CBG: Recent Labs  Lab 08/15/20 0729 08/15/20 1131 08/15/20 1538 08/15/20 2148 08/16/20 0608  GLUCAP 140* 140* 159* 144* 137*   Lipid Profile: Recent Labs    08/14/20 0452  CHOL 276*  HDL 33*  LDLCALC 192*  TRIG 253*  CHOLHDL 8.4   Thyroid Function Tests: Recent Labs    08/14/20 1257 08/14/20 1531  TSH 5.311*  --   FREET4  --  0.84  T3FREE 2.8  --    Anemia Panel: Recent Labs    08/14/20 1257  VITAMINB12 187   Urine analysis:    Component Value Date/Time    COLORURINE YELLOW 08/11/2020 0050   APPEARANCEUR CLEAR 08/11/2020 0050   LABSPEC 1.020 08/11/2020 0050   PHURINE 5.0 08/11/2020 0050   GLUCOSEU NEGATIVE 08/11/2020 0050   HGBUR LARGE (A) 08/11/2020 0050   BILIRUBINUR NEGATIVE 08/11/2020 0050   KETONESUR NEGATIVE 08/11/2020 0050   PROTEINUR 30 (A) 08/11/2020 0050   NITRITE NEGATIVE 08/11/2020 0050   LEUKOCYTESUR NEGATIVE 08/11/2020 0050   Recent Results (from the past 240 hour(s))  SARS CORONAVIRUS 2 (TAT 6-24 HRS) Nasopharyngeal Nasopharyngeal Swab     Status: None   Collection Time: 08/11/20  2:31 AM   Specimen: Nasopharyngeal Swab  Result Value Ref Range Status   SARS Coronavirus 2 NEGATIVE NEGATIVE Final  Comment: (NOTE) SARS-CoV-2 target nucleic acids are NOT DETECTED.  The SARS-CoV-2 RNA is generally detectable in upper and lower respiratory specimens during the acute phase of infection. Negative results do not preclude SARS-CoV-2 infection, do not rule out co-infections with other pathogens, and should not be used as the sole basis for treatment or other patient management decisions. Negative results must be combined with clinical observations, patient history, and epidemiological information. The expected result is Negative.  Fact Sheet for Patients: SugarRoll.be  Fact Sheet for Healthcare Providers: https://www.woods-mathews.com/  This test is not yet approved or cleared by the [place] FDA and  has been authorized for detection and/or diagnosis of SARS-CoV-2 by FDA under an Emergency Use Authorization (EUA). This EUA will remain  in effect (meaning this test can be used) for the duration of the COVID-19 declaration under Se ction 564(b)(1) of the Act, 21 U.S.C. section 360bbb-3(b)(1), unless the authorization is terminated or revoked sooner.  Performed at [facility], [place] [address], [place], [place] [address]   SARS Coronavirus 2 by RT PCR (hospital order,  performed in [facility] hospital lab) Nasopharyngeal Nasopharyngeal Swab     Status: None   Collection Time: 08/13/20  1:50 AM   Specimen: Nasopharyngeal Swab  Result Value Ref Range Status   SARS Coronavirus 2 NEGATIVE NEGATIVE Final    Comment: (NOTE) SARS-CoV-2 target nucleic acids are NOT DETECTED.  The SARS-CoV-2 RNA is generally detectable in upper and lower respiratory specimens during the acute phase of infection. The lowest concentration of SARS-CoV-2 viral copies this assay can detect is 250 copies / mL. A negative result does not preclude SARS-CoV-2 infection and should not be used as the sole basis for treatment or other patient management decisions.  A negative result may occur with improper specimen collection / handling, submission of specimen other than nasopharyngeal swab, presence of viral mutation(s) within the areas targeted by this assay, and inadequate number of viral copies (<250 copies / mL). A negative result must be combined with clinical observations, patient history, and epidemiological information.  Fact Sheet for Patients:   StrictlyIdeas.no  Fact Sheet for Healthcare Providers: BankingDealers.co.za  This test is not yet approved or  cleared by the [place] FDA and has been authorized for detection and/or diagnosis of SARS-CoV-2 by FDA under an Emergency Use Authorization (EUA).  This EUA will remain in effect (meaning this test can be used) for the duration of the COVID-19 declaration under Section 564(b)(1) of the Act, 21 U.S.C. section 360bbb-3(b)(1), unless the authorization is terminated or revoked sooner.  Performed at [facility], [address] [place], [place] [address]       Radiology Studies: No results found.  Scheduled Meds: . enoxaparin (LOVENOX) injection  40 mg Subcutaneous Q24H  . insulin aspart  0-6 Units Subcutaneous TID WC  . rosuvastatin  5 mg Oral Daily  .  vitamin B-12  100 mcg Oral Daily   Continuous Infusions: . Immune Globulin 10% 30 g (08/15/20 1557)     LOS: 2 days   Time spent: 35 minutes.  Patrecia Pour, MD [facility] [email or web address] 08/16/2020, 8:17 AM

## 2020-08-16 NOTE — Plan of Care (Signed)

## 2020-08-16 NOTE — Progress Notes (Signed)
Neurology Progress Note  S: states his numbness and tingling has improved over yesterday, but still has calf pain. PT note reviewed and patient remains max to moderate assist to stand and ambulate. He agreed to do spinal MRIs. Son in room as interpretor. Spoke about no need to do LP now because we are treating empirically. Patient states he is no longer nauseated.  Meds and primary note reviewed.   O: Current vital signs: BP (!) 142/99 (BP Location: Right Arm)   Pulse 87   Temp 98.3 F (36.8 C) (Oral)   Resp 16   Ht 5\' 7"  (1.702 m)   Wt 80.3 kg   SpO2 96%   BMI 27.72 kg/m  Vital signs in last 24 hours: Temp:  [97.4 F (36.3 C)-98.7 F (37.1 C)] 98.3 F (36.8 C) (02/06 1201) Pulse Rate:  [77-91] 87 (02/06 1201) Resp:  [16-19] 16 (02/06 1201) BP: (120-149)/(85-103) 142/99 (02/06 1201) SpO2:  [95 %-99 %] 96 % (02/06 1201)  GENERAL: Awake, alert in NAD HEENT: Normocephalic and atraumatic LUNGS: Normal respiratory effort.  CV: RRR  ABDOMEN: Soft, nontender Ext: warm  NEURO:  Mental Status: AA&Ox3  Speech/Language: speech is fluent. Comprehension intact. Cranial Nerves:  II: PERRL. Visual fields full.  III, IV, VI: EOMI. Eyelids elevate symmetrically.  V: Sensation is intact to light touch and symmetrical to face.  VII: Smile is symmetrical.  VIII: hearing intact to voice. IX, X: Palate elevates symmetrically. Phonation is normal.  [birth year]1993 shrug 5/5. XII: tongue is midline without fasciculations. Motor: 5/5 strength to all muscle groups tested.  Tone: is normal and bulk is normal Sensation- Intact to light touch bilaterally but decreased below knees and in right arm. Vibratory sense decreased from toes to mid tibia bilaterally. Extinction intact.  Coordination: FTN intact bilaterally, HKS: no ataxia in BLE. No drift.  DTRs: 0 LEs and RUE. 2+ LUE Gait- deferred  Assessment: . Ascending polyneuropathy of unknown type or etiology. His findings do not represent any  typical neuropathy/myelopathy type syndrome, not even GBS. His strength is normal on exam and he is able to move his extremities around freely. But, per PT notes, he is max assist to walk. CIR is plan after discharge.     Impression: polyneuropathy of unknown etiology  Recommendations: -Good that he is getting MRI spine.  -Continue IVIG complete course. Encouraging that his subjective symptoms have improved. His distal exam improved slightly today to vibratory sense. -Continue B12 supplementation.  -We will follow along.    Pt seen by [email or web address], MSN, APN-BC/Nurse Practitioner/Neuro and MD. Pager: Jimmye Norman

## 2020-08-16 NOTE — Progress Notes (Signed)
Inpatient Diabetes Program Recommendations  AACE/ADA: New Consensus Statement on Inpatient Glycemic Control (2015)  Target Ranges:  Prepandial:   less than 140 mg/dL      Peak postprandial:   less than 180 mg/dL (1-2 hours)      Critically ill patients:  140 - 180 mg/dL   Lab Results  Component Value Date   GLUCAP 101 (H) 08/16/2020   HGBA1C 7.0 (H) 08/14/2020    Review of Glycemic Control  Diabetes history: New DM diagnosis Outpatient Diabetes medications: None Current orders for Inpatient glycemic control: Novolog 0-6 units TID with meals  HgbA1C - 7% CBGs: 137, 134, 123, 101 Has received no insulin today. Received only 1 units of Novolog on 2/5. Poor po intake.  Inpatient Diabetes Program Recommendations:     Ordered Living Well with Diabetes book.  Diabetes Coordinator to see pt on 2/7 regarding new diagnosis. Will not need insulin for home.   Continue to follow.  Thank you. Ailene Ards, RD, LDN, CDE Inpatient Diabetes Coordinator [phone]

## 2020-08-16 NOTE — Progress Notes (Signed)
Called to speak with pt.'s RN to see if 12p was still the planned time to get this pt. For MRI. RN states she was not aware of 12p time for MRI and stated the son is not at bedside and we were told the pt. needed the son with him to complete the MRI. RN was asked to call us when the son gets here and then we can send transport and Ativan will be given when transport arrives.

## 2020-08-17 DIAGNOSIS — G610 Guillain-Barre syndrome: Secondary | ICD-10-CM | POA: Diagnosis not present

## 2020-08-17 DIAGNOSIS — M792 Neuralgia and neuritis, unspecified: Secondary | ICD-10-CM | POA: Diagnosis not present

## 2020-08-17 DIAGNOSIS — R202 Paresthesia of skin: Secondary | ICD-10-CM | POA: Diagnosis not present

## 2020-08-17 LAB — GLUCOSE, CAPILLARY
Glucose-Capillary: 140 mg/dL — ABNORMAL HIGH (ref 70–99)
Glucose-Capillary: 115 mg/dL — ABNORMAL HIGH (ref 70–99)
Glucose-Capillary: 139 mg/dL — ABNORMAL HIGH (ref 70–99)
Glucose-Capillary: 129 mg/dL — ABNORMAL HIGH (ref 70–99)

## 2020-08-17 LAB — MISC LABCORP TEST (SEND OUT): Labcorp test code: 50591

## 2020-08-17 MED ORDER — AMLODIPINE BESYLATE 5 MG PO TABS
5.0000 mg | ORAL_TABLET | Freq: Every day | ORAL | Status: DC
  Administered 2020-08-17 – 2020-08-21 (×5): 5 mg via ORAL
  Filled 2020-08-17 (×5): qty 1

## 2020-08-17 MED ORDER — CYANOCOBALAMIN 1000 MCG/ML IJ SOLN
1000.0000 ug | Freq: Every day | INTRAMUSCULAR | Status: AC
  Administered 2020-08-18 – 2020-08-23 (×6): 1000 ug via SUBCUTANEOUS
  Filled 2020-08-17 (×6): qty 1

## 2020-08-17 MED ORDER — HYDRALAZINE HCL 20 MG/ML IJ SOLN: 5.0000 mg | INTRAMUSCULAR | Status: DC | PRN

## 2020-08-17 MED ORDER — CYANOCOBALAMIN 1000 MCG/ML IJ SOLN
1000.0000 ug | INTRAMUSCULAR | Status: DC
  Administered 2020-08-24 – 2020-08-31 (×2): 1000 ug via INTRAMUSCULAR
  Filled 2020-08-17 (×2): qty 1

## 2020-08-17 MED ORDER — HYDROMORPHONE HCL 1 MG/ML IJ SOLN
0.5000 mg | INTRAMUSCULAR | Status: DC | PRN
  Administered 2020-08-17 (×2): 0.5 mg via INTRAVENOUS
  Filled 2020-08-17 (×2): qty 0.5

## 2020-08-17 NOTE — Progress Notes (Signed)
Inpatient Diabetes Program Recommendations  AACE/ADA: New Consensus Statement on Inpatient Glycemic Control (2015)  Target Ranges:  Prepandial:   less than 140 mg/dL      Peak postprandial:   less than 180 mg/dL (1-2 hours)      Critically ill patients:  140 - 180 mg/dL   Lab Results  Component Value Date   GLUCAP 115 (H) 08/17/2020   HGBA1C 7.0 (H) 08/14/2020    Review of Glycemic Control Results for JAMAREA, SELNER (MRN [id number]) as of 08/17/2020 15:17  Ref. Range 08/16/2020 12:00 08/16/2020 16:12 08/16/2020 21:08 08/17/2020 06:11 08/17/2020 11:50  Glucose-Capillary Latest Ref Range: 70 - 99 mg/dL 885 (H) 027 (H) 741 (H) 140 (H) 115 (H)   Diabetes history:  New DM2 Current orders for Inpatient glycemic control:  Novolog 0-6 units TID  Note: Spoke with pt about new diagnosis. Discussed A1C results with him and explained what an A1C is, basic pathophysiology of DM Type 2, basic home care, basic diabetes diet nutrition principles, importance of checking CBGs and maintaining good CBG control to prevent long-term and short-term complications. Reviewed signs and symptoms of hyperglycemia.  Also reviewed blood sugar goals at home.  RNs to provide ongoing basic DM education at bedside with this patient. Have ordered educational booklet & RD consult for DM diet education for this patient. Educated patient on The Plate Method, CHO's and importance of physical activity.  Please discharge him with a prescription for a glucometer, order # [id number].  Asked him to check blood sugar once a day in the morning before eating.  Explained importance of close follow up with PCP.  Discussed possibility of discharging home on oral agents.    Will continue to follow while inpatient.  Thank you, Dulce Sellar, RN, BSN Diabetes Coordinator Inpatient Diabetes Program [phone] (team pager from 8a-5p)

## 2020-08-17 NOTE — Progress Notes (Signed)
Pt's IVIG drip completed and flushed with D5W accordingly, pt denies any new discomfort at this time, v/s stable, was however reassured and will continue to monitor. Obasogie-Asidi, Justino Boze Efe

## 2020-08-17 NOTE — Plan of Care (Signed)

## 2020-08-17 NOTE — Care Management Important Message (Signed)
Important Message  Patient Details  Name: Jacob Cameron MRN: [id number] Date of Birth: August 23, 1955   Medicare Important Message Given:  Yes     Dorena Bodo 08/17/2020, 3:02 PM

## 2020-08-17 NOTE — Progress Notes (Signed)
Inpatient Rehab Admissions:  Inpatient Rehab Consult received.  I met with patient at the bedside for rehabilitation assessment and to discuss goals and expectations of an inpatient rehab admission.  He is in a lot of pain, writhing in the bed, difficulty concentrating.  Had been medicated ~30 minutes prior.  At this point, it does not appear that any definitive or working diagnosis has been identified.  MRI head/spine negative, and pt refusing LP.  Will need to discuss with rehab MD for medical necessity, and speak to patient again when he's not in such pain.  Will try to f/u with him this afternoon.   Signed: Shann Medal, PT, DPT Admissions Coordinator [phone] 08/17/20  12:08 PM

## 2020-08-17 NOTE — Progress Notes (Signed)
PROGRESS NOTE    Jacob Cameron  DQQ:[id number] DOB: 08/20/1955 DOA: 08/12/2020 PCP: Patient, No Pcp Per   Brief Narrative:  Jacob Cameron is a 65 y.o. male without medical history, s/p J&J covid vaccine April 2021 who presented with paresthesias in the right hand and feet with associated pain that has progressed over the previous few days now causing him to be unable to walk. MRI brain was nonacute, so he was admitted to [facility] for neurology evaluation on the advice of teleneurology with MRI C/T/L spine pending. Neurology has initiated IVIG empirically. Symptoms are stable and severely disabling. PT and OT recommend CIR at this time. He has undergone MRI on 2/6 with no acute findings. Refusing LP.  Assessment & Plan:   Principal Problem:   Paresthesias Active Problems:   Tobacco abuse   Polyneuropathy, idiopathic progressive   Distal polyneuropathy: Unclear etiology. Vitamin B12 level wnl at 187. CK wnl. ESR 20, CRP 1.6.  - Neurology directing management: Continuing with IVIG daily x5 days for empiric Tx (end 2/9) - B12 supplementation due to low-normal level. - Pt reports he's willing to try MRI. D/w RN, neuro, and reordered prn ativan to assist with anxiety/claustrophobia.  - Decision Re: LP refused - Continue gabapentin low dose.  - Therapy recommending CIR. Pt is currently severely debilitated by symptoms and would not be able to return home safely. PM&R consulted.  Nausea, vomiting, poor appetite: Abdomen is benign. Having BM's. No bleeding.  - prn antiemetic added.  - ?if related to narcotic pain medication. Will change hydrocodone > oxycodone, monitor exam closely.   T2DM: HbA1c 7%, new diagnosis. Given lack of significant symptoms, do not believe this is likely to have been present long enough or severe enough to precipitate acute polyneuropathy. - Diabetes coordinator and dietitian consulted - Start metformin after we know contrasted exams will no longer be needed and after GI  symptoms have resolved.  - Start sensitive SSI, CBGs at inpatient goal thus far, though very little po intake.  Neutrophilic leukocytosis: Afebrile. No meningismus on exam. WBC coming down. - Continue monitoring off antimicrobial therapy. Remains afebrile. ESR, CRP not indicative of severe inflammatory state.   Hypertension -Started amlodipine 5mg  daily on 2/7  Hyponatremia:  - Monitor intermittently  Dyslipidemia: LDL 192. LFTs wnl.  - Started rosuvastatin  Tobacco use:  - Cessation counseling  Elevated TSH: Mildly elevated at 5.311. T3, T4 wnl.  - Recheck in 4-6 weeks recommended  DVT prophylaxis: Lovenox Code Status: Full Family Communication: Spoke with son on phone 2/7 Disposition Plan:  Status is: Inpatient due to need for additional diagnostic testing and treatment with IV therapies due to severity of illness.   Dispo: The patient is from: Home  Anticipated d/c is to: CIR  Anticipated d/c date is: 3 days  Patient currently is not medically stable to d/c.  Consultants:   Neurology  Procedures:   None  Antimicrobials:  None   Subjective: Patient seen and evaluated today with no new acute complaints or concerns. No acute concerns or events noted overnight. He continues to have "pins and needles" sensation to his feet as well as some low back pain.  Objective: Vitals:   08/17/20 0315 08/17/20 0531 08/17/20 0730 08/17/20 1110  BP: (!) 154/107 (!) 170/105 (!) 149/102 (!) 137/97  Pulse: 84 93 90 94  Resp: 16  18 18   Temp: 98.1 F (36.7 C)  98.6 F (37 C) 97.7 F (36.5 C)  TempSrc: Oral  Oral Oral  SpO2:  96%  98% 97%  Weight:      Height:        Intake/Output Summary (Last 24 hours) at 08/17/2020 1156 Last data filed at 08/16/2020 2130 Gross per 24 hour  Intake 900 ml  Output --  Net 900 ml   Filed Weights   08/12/20 2053  Weight: 80.3 kg    Examination:  General exam: Appears calm and comfortable   Respiratory system: Clear to auscultation. Respiratory effort normal. Cardiovascular system: S1 & S2 heard, RRR.  Gastrointestinal system: Abdomen is soft Central nervous system: Alert and awake Extremities: No edema Skin: No significant lesions noted Psychiatry: Flat affect.    Data Reviewed: I have personally reviewed following labs and imaging studies  CBC: Recent Labs  Lab 08/10/20 2120 08/15/20 0245  WBC 16.4* 14.5*  NEUTROABS 12.4* 12.0*  HGB 15.5 14.0  HCT 46.8 43.0  MCV 84.3 84.8  PLT 412* 161   Basic Metabolic Panel: Recent Labs  Lab 08/10/20 2120 08/15/20 0245  NA 134* 130*  K 3.8 3.5  CL 99 98  CO2 23 22  GLUCOSE 113* 136*  BUN 20 21  CREATININE 1.14 1.08  CALCIUM 9.5 8.7*   GFR: Estimated Creatinine Clearance: 68.3 mL/min (by C-G formula based on SCr of 1.08 mg/dL). Liver Function Tests: Recent Labs  Lab 08/15/20 0245  AST 16  ALT 17  ALKPHOS 67  BILITOT 1.1  PROT 7.4  ALBUMIN 3.1*   No results for input(s): LIPASE, AMYLASE in the last 168 hours. No results for input(s): AMMONIA in the last 168 hours. Coagulation Profile: No results for input(s): INR, PROTIME in the last 168 hours. Cardiac Enzymes: Recent Labs  Lab 08/10/20 2120  CKTOTAL 241   BNP (last 3 results) No results for input(s): PROBNP in the last 8760 hours. HbA1C: No results for input(s): HGBA1C in the last 72 hours. CBG: Recent Labs  Lab 08/16/20 1200 08/16/20 1612 08/16/20 2108 08/17/20 0611 08/17/20 1150  GLUCAP 134* 123* 101* 140* 115*   Lipid Profile: No results for input(s): CHOL, HDL, LDLCALC, TRIG, CHOLHDL, LDLDIRECT in the last 72 hours. Thyroid Function Tests: Recent Labs    08/14/20 1257 08/14/20 1531  TSH 5.311*  --   FREET4  --  0.84  T3FREE 2.8  --    Anemia Panel: Recent Labs    08/14/20 1257  VITAMINB12 187   Sepsis Labs: No results for input(s): PROCALCITON, LATICACIDVEN in the last 168 hours.  Recent Results (from the past 240  hour(s))  SARS CORONAVIRUS 2 (TAT 6-24 HRS) Nasopharyngeal Nasopharyngeal Swab     Status: None   Collection Time: 08/11/20  2:31 AM   Specimen: Nasopharyngeal Swab  Result Value Ref Range Status   SARS Coronavirus 2 NEGATIVE NEGATIVE Final    Comment: (NOTE) SARS-CoV-2 target nucleic acids are NOT DETECTED.  The SARS-CoV-2 RNA is generally detectable in upper and lower respiratory specimens during the acute phase of infection. Negative results do not preclude SARS-CoV-2 infection, do not rule out co-infections with other pathogens, and should not be used as the sole basis for treatment or other patient management decisions. Negative results must be combined with clinical observations, patient history, and epidemiological information. The expected result is Negative.  Fact Sheet for Patients: SugarRoll.be  Fact Sheet for Healthcare Providers: https://www.woods-mathews.com/  This test is not yet approved or cleared by the [place] FDA and  has been authorized for detection and/or diagnosis of SARS-CoV-2 by FDA under an Emergency Use Authorization (EUA).  This EUA will remain  in effect (meaning this test can be used) for the duration of the COVID-19 declaration under Se ction 564(b)(1) of the Act, 21 U.S.C. section 360bbb-3(b)(1), unless the authorization is terminated or revoked sooner.  Performed at [facility], [place] [address] [place], [place] [address]   SARS Coronavirus 2 by RT PCR (hospital order, performed in [facility] hospital lab) Nasopharyngeal Nasopharyngeal Swab     Status: None   Collection Time: 08/13/20  1:50 AM   Specimen: Nasopharyngeal Swab  Result Value Ref Range Status   SARS Coronavirus 2 NEGATIVE NEGATIVE Final    Comment: (NOTE) SARS-CoV-2 target nucleic acids are NOT DETECTED.  The SARS-CoV-2 RNA is generally detectable in upper and lower respiratory specimens during the acute phase of infection.  The lowest concentration of SARS-CoV-2 viral copies this assay can detect is 250 copies / mL. A negative result does not preclude SARS-CoV-2 infection and should not be used as the sole basis for treatment or other patient management decisions.  A negative result may occur with improper specimen collection / handling, submission of specimen other than nasopharyngeal swab, presence of viral mutation(s) within the areas targeted by this assay, and inadequate number of viral copies (<250 copies / mL). A negative result must be combined with clinical observations, patient history, and epidemiological information.  Fact Sheet for Patients:   StrictlyIdeas.no  Fact Sheet for Healthcare Providers: BankingDealers.co.za  This test is not yet approved or  cleared by the [place] FDA and has been authorized for detection and/or diagnosis of SARS-CoV-2 by FDA under an Emergency Use Authorization (EUA).  This EUA will remain in effect (meaning this test can be used) for the duration of the COVID-19 declaration under Section 564(b)(1) of the Act, 21 U.S.C. section 360bbb-3(b)(1), unless the authorization is terminated or revoked sooner.  Performed at [facility], [address], [place], [place] [address]          Radiology Studies: MR CERVICAL SPINE WO CONTRAST  Result Date: 08/16/2020 CLINICAL DATA:  Low back pain.  Progressive neurological deficit. EXAM: MRI CERVICAL, THORACIC AND LUMBAR SPINE WITHOUT CONTRAST TECHNIQUE: Multiplanar and multiecho pulse sequences of the cervical spine, to include the craniocervical junction and cervicothoracic junction, and thoracic and lumbar spine, were obtained without intravenous contrast. COMPARISON:  None. FINDINGS: Patient motion degrades image quality limiting evaluation. MRI CERVICAL SPINE FINDINGS Alignment: Physiologic. Vertebrae: No fracture, evidence of discitis, or bone lesion. Cord:  Normal signal and morphology. Posterior Fossa, vertebral arteries, paraspinal tissues: Posterior fossa demonstrates no focal abnormality. Vertebral artery flow voids are maintained. Paraspinal soft tissues are unremarkable. Disc levels: Discs: Degenerative disease with disc height loss C3-4, C4-5 and C5-6. C2-3: No significant disc bulge. No neural foraminal stenosis. No central canal stenosis. C3-4: No significant disc bulge. Severe right foraminal stenosis. Mild left foraminal stenosis. No central canal stenosis. C4-5: No significant disc bulge. Bilateral uncovertebral degenerative changes. Severe bilateral foraminal stenosis. No central canal stenosis. C5-6: Broad-based disc bulge. Mild right and severe left foraminal stenosis. No central canal stenosis. C6-7: No significant disc bulge. Mild left foraminal stenosis. No right foraminal stenosis. No central canal stenosis. C7-T1: No significant disc bulge. No neural foraminal stenosis. No central canal stenosis. MRI THORACIC SPINE FINDINGS Alignment:  Physiologic. Vertebrae: No acute fracture, discitis osteomyelitis, or aggressive osseous lesion. T6 vertebral body hemangioma. L1 vertebral body hemangioma. Cord:  Normal signal and morphology. Paraspinal and other soft tissues: No acute paraspinal abnormality. Disc  levels: Disc spaces:  Degenerative disease with disc height loss at T6-7. T1-T2: No disc protrusion, foraminal stenosis or central canal stenosis. T2-T3: No disc protrusion, foraminal stenosis or central canal stenosis. T3-T4: No disc protrusion, foraminal stenosis or central canal stenosis. T4-T5: No disc protrusion, foraminal stenosis or central canal stenosis. T5-T6: No disc protrusion, foraminal stenosis or central canal stenosis. T6-T7: Tiny right paracentral disc protrusion. No foraminal or central canal stenosis. T7-T8: No disc protrusion, foraminal stenosis or central canal stenosis. T8-T9: No disc protrusion, foraminal stenosis or central canal  stenosis. T9-T10: No disc protrusion, foraminal stenosis or central canal stenosis. T10-T11: No disc protrusion, foraminal stenosis or central canal stenosis. T11-T12: Mild broad-based disc bulge. No foraminal or central canal stenosis. MRI LUMBAR SPINE FINDINGS Segmentation: Transitional anatomy of the lumbar spine with sacralization of the L5 vertebral body. Alignment: Minimal grade 1 anterolisthesis of L4 on L5 secondary to facet disease. Vertebrae:  No fracture, evidence of discitis, or bone lesion. Conus medullaris and cauda equina: Conus extends to the T12-L1 level. Conus and cauda equina appear normal. Paraspinal and other soft tissues: No acute paraspinal abnormality. Disc levels: Disc spaces: Disc desiccation of the lumbar spine. T12-L1: Mild broad-based disc bulge with a tiny central disc protrusion. No evidence of neural foraminal stenosis. No central canal stenosis. L1-L2: Mild broad-based disc bulge. Mild bilateral facet arthropathy. No evidence of neural foraminal stenosis. No central canal stenosis. L2-L3: No significant disc bulge. No evidence of neural foraminal stenosis. No central canal stenosis. L3-L4: Minimal broad-based disc bulge. No evidence of neural foraminal stenosis. No central canal stenosis. L4-L5: Broad-based disc bulge. Severe bilateral facet arthropathy with bilateral facet effusions. Bilateral subarticular recess narrowing. No evidence of neural foraminal stenosis. No central canal stenosis. L5-S1: No significant disc bulge. No evidence of neural foraminal stenosis. No central canal stenosis. IMPRESSION: 1. No acute injury of the cervical, thoracic, and lumbar spine. 2. Mild spondylosis of the cervical, thoracic and lumbar spine. Electronically Signed   By: Kathreen Devoid   On: 08/16/2020 13:59   MR THORACIC SPINE WO CONTRAST  Result Date: 08/16/2020 CLINICAL DATA:  Low back pain.  Progressive neurological deficit. EXAM: MRI CERVICAL, THORACIC AND LUMBAR SPINE WITHOUT CONTRAST  TECHNIQUE: Multiplanar and multiecho pulse sequences of the cervical spine, to include the craniocervical junction and cervicothoracic junction, and thoracic and lumbar spine, were obtained without intravenous contrast. COMPARISON:  None. FINDINGS: Patient motion degrades image quality limiting evaluation. MRI CERVICAL SPINE FINDINGS Alignment: Physiologic. Vertebrae: No fracture, evidence of discitis, or bone lesion. Cord: Normal signal and morphology. Posterior Fossa, vertebral arteries, paraspinal tissues: Posterior fossa demonstrates no focal abnormality. Vertebral artery flow voids are maintained. Paraspinal soft tissues are unremarkable. Disc levels: Discs: Degenerative disease with disc height loss C3-4, C4-5 and C5-6. C2-3: No significant disc bulge. No neural foraminal stenosis. No central canal stenosis. C3-4: No significant disc bulge. Severe right foraminal stenosis. Mild left foraminal stenosis. No central canal stenosis. C4-5: No significant disc bulge. Bilateral uncovertebral degenerative changes. Severe bilateral foraminal stenosis. No central canal stenosis. C5-6: Broad-based disc bulge. Mild right and severe left foraminal stenosis. No central canal stenosis. C6-7: No significant disc bulge. Mild left foraminal stenosis. No right foraminal stenosis. No central canal stenosis. C7-T1: No significant disc bulge. No neural foraminal stenosis. No central canal stenosis. MRI THORACIC SPINE FINDINGS Alignment:  Physiologic. Vertebrae: No acute fracture, discitis osteomyelitis, or aggressive osseous lesion. T6 vertebral body hemangioma. L1 vertebral body hemangioma. Cord:  Normal signal and morphology. Paraspinal and  other soft tissues: No acute paraspinal abnormality. Disc levels: Disc spaces:  Degenerative disease with disc height loss at T6-7. T1-T2: No disc protrusion, foraminal stenosis or central canal stenosis. T2-T3: No disc protrusion, foraminal stenosis or central canal stenosis. T3-T4: No disc  protrusion, foraminal stenosis or central canal stenosis. T4-T5: No disc protrusion, foraminal stenosis or central canal stenosis. T5-T6: No disc protrusion, foraminal stenosis or central canal stenosis. T6-T7: Tiny right paracentral disc protrusion. No foraminal or central canal stenosis. T7-T8: No disc protrusion, foraminal stenosis or central canal stenosis. T8-T9: No disc protrusion, foraminal stenosis or central canal stenosis. T9-T10: No disc protrusion, foraminal stenosis or central canal stenosis. T10-T11: No disc protrusion, foraminal stenosis or central canal stenosis. T11-T12: Mild broad-based disc bulge. No foraminal or central canal stenosis. MRI LUMBAR SPINE FINDINGS Segmentation: Transitional anatomy of the lumbar spine with sacralization of the L5 vertebral body. Alignment: Minimal grade 1 anterolisthesis of L4 on L5 secondary to facet disease. Vertebrae:  No fracture, evidence of discitis, or bone lesion. Conus medullaris and cauda equina: Conus extends to the T12-L1 level. Conus and cauda equina appear normal. Paraspinal and other soft tissues: No acute paraspinal abnormality. Disc levels: Disc spaces: Disc desiccation of the lumbar spine. T12-L1: Mild broad-based disc bulge with a tiny central disc protrusion. No evidence of neural foraminal stenosis. No central canal stenosis. L1-L2: Mild broad-based disc bulge. Mild bilateral facet arthropathy. No evidence of neural foraminal stenosis. No central canal stenosis. L2-L3: No significant disc bulge. No evidence of neural foraminal stenosis. No central canal stenosis. L3-L4: Minimal broad-based disc bulge. No evidence of neural foraminal stenosis. No central canal stenosis. L4-L5: Broad-based disc bulge. Severe bilateral facet arthropathy with bilateral facet effusions. Bilateral subarticular recess narrowing. No evidence of neural foraminal stenosis. No central canal stenosis. L5-S1: No significant disc bulge. No evidence of neural foraminal  stenosis. No central canal stenosis. IMPRESSION: 1. No acute injury of the cervical, thoracic, and lumbar spine. 2. Mild spondylosis of the cervical, thoracic and lumbar spine. Electronically Signed   By: Kathreen Devoid   On: 08/16/2020 13:59   MR LUMBAR SPINE WO CONTRAST  Result Date: 08/16/2020 CLINICAL DATA:  Low back pain.  Progressive neurological deficit. EXAM: MRI CERVICAL, THORACIC AND LUMBAR SPINE WITHOUT CONTRAST TECHNIQUE: Multiplanar and multiecho pulse sequences of the cervical spine, to include the craniocervical junction and cervicothoracic junction, and thoracic and lumbar spine, were obtained without intravenous contrast. COMPARISON:  None. FINDINGS: Patient motion degrades image quality limiting evaluation. MRI CERVICAL SPINE FINDINGS Alignment: Physiologic. Vertebrae: No fracture, evidence of discitis, or bone lesion. Cord: Normal signal and morphology. Posterior Fossa, vertebral arteries, paraspinal tissues: Posterior fossa demonstrates no focal abnormality. Vertebral artery flow voids are maintained. Paraspinal soft tissues are unremarkable. Disc levels: Discs: Degenerative disease with disc height loss C3-4, C4-5 and C5-6. C2-3: No significant disc bulge. No neural foraminal stenosis. No central canal stenosis. C3-4: No significant disc bulge. Severe right foraminal stenosis. Mild left foraminal stenosis. No central canal stenosis. C4-5: No significant disc bulge. Bilateral uncovertebral degenerative changes. Severe bilateral foraminal stenosis. No central canal stenosis. C5-6: Broad-based disc bulge. Mild right and severe left foraminal stenosis. No central canal stenosis. C6-7: No significant disc bulge. Mild left foraminal stenosis. No right foraminal stenosis. No central canal stenosis. C7-T1: No significant disc bulge. No neural foraminal stenosis. No central canal stenosis. MRI THORACIC SPINE FINDINGS Alignment:  Physiologic. Vertebrae: No acute fracture, discitis osteomyelitis, or  aggressive osseous lesion. T6 vertebral body hemangioma. L1 vertebral body  hemangioma. Cord:  Normal signal and morphology. Paraspinal and other soft tissues: No acute paraspinal abnormality. Disc levels: Disc spaces:  Degenerative disease with disc height loss at T6-7. T1-T2: No disc protrusion, foraminal stenosis or central canal stenosis. T2-T3: No disc protrusion, foraminal stenosis or central canal stenosis. T3-T4: No disc protrusion, foraminal stenosis or central canal stenosis. T4-T5: No disc protrusion, foraminal stenosis or central canal stenosis. T5-T6: No disc protrusion, foraminal stenosis or central canal stenosis. T6-T7: Tiny right paracentral disc protrusion. No foraminal or central canal stenosis. T7-T8: No disc protrusion, foraminal stenosis or central canal stenosis. T8-T9: No disc protrusion, foraminal stenosis or central canal stenosis. T9-T10: No disc protrusion, foraminal stenosis or central canal stenosis. T10-T11: No disc protrusion, foraminal stenosis or central canal stenosis. T11-T12: Mild broad-based disc bulge. No foraminal or central canal stenosis. MRI LUMBAR SPINE FINDINGS Segmentation: Transitional anatomy of the lumbar spine with sacralization of the L5 vertebral body. Alignment: Minimal grade 1 anterolisthesis of L4 on L5 secondary to facet disease. Vertebrae:  No fracture, evidence of discitis, or bone lesion. Conus medullaris and cauda equina: Conus extends to the T12-L1 level. Conus and cauda equina appear normal. Paraspinal and other soft tissues: No acute paraspinal abnormality. Disc levels: Disc spaces: Disc desiccation of the lumbar spine. T12-L1: Mild broad-based disc bulge with a tiny central disc protrusion. No evidence of neural foraminal stenosis. No central canal stenosis. L1-L2: Mild broad-based disc bulge. Mild bilateral facet arthropathy. No evidence of neural foraminal stenosis. No central canal stenosis. L2-L3: No significant disc bulge. No evidence of neural  foraminal stenosis. No central canal stenosis. L3-L4: Minimal broad-based disc bulge. No evidence of neural foraminal stenosis. No central canal stenosis. L4-L5: Broad-based disc bulge. Severe bilateral facet arthropathy with bilateral facet effusions. Bilateral subarticular recess narrowing. No evidence of neural foraminal stenosis. No central canal stenosis. L5-S1: No significant disc bulge. No evidence of neural foraminal stenosis. No central canal stenosis. IMPRESSION: 1. No acute injury of the cervical, thoracic, and lumbar spine. 2. Mild spondylosis of the cervical, thoracic and lumbar spine. Electronically Signed   By: Kathreen Devoid   On: 08/16/2020 13:59        Scheduled Meds: . amLODipine  5 mg Oral Daily  . enoxaparin (LOVENOX) injection  40 mg Subcutaneous Q24H  . gabapentin  100 mg Oral TID  . insulin aspart  0-6 Units Subcutaneous TID WC  . rosuvastatin  5 mg Oral Daily  . vitamin B-12  100 mcg Oral Daily   Continuous Infusions: . Immune Globulin 10% Stopped (08/16/20 2130)     LOS: 3 days    Time spent: 35 minutes    Tamrah Victorino Darleen Crocker, DO [facility]  If 7PM-7AM, please contact night-coverage [email or web address] 08/17/2020, 11:56 AM

## 2020-08-17 NOTE — Progress Notes (Addendum)
Neurology Progress Note Subjective: No acute events overnight.  On examination this morning, patient reports improvement in lower extremity paresthesias today. States it is somewhat better than yesterday. Persistent "pin prick" pain noted on the dorsal and plantar feet and calves in a circumferential, stocking distribution from the knee downwards. Also endorses painful glove-distribution paresthesias on the right, but not on the left - he states that his left arm is asymptomatic. Clarified onset of symptoms; patient endorses sudden bilateral painful paresthesias and weakness in the feet with proximal progression to the knees and then in the right hand with extension upward into entire right upper extremity.  Exam: Vitals:   08/17/20 0531 08/17/20 0730  BP: (!) 170/105 (!) 149/102  Pulse: 93 90  Resp:  18  Temp:  98.6 F (37 C)  SpO2:  98%   Gen: Sitting up in bed, eating breakfast. No acute distress. Resp: non-labored breathing, symmetric chest rise with inspiration Abd: soft, non-distended  Neuro: Ment: awake, alert, oriented to age, month, time, place, and situation. Patient is able to give a clear and coherent history. Follows all commands. Speech is fluent without dysarthria or aphasia. No signs of neglect noted.  CN: II: PERRL 3 mm/brisk. Visual fields full.  III, IV, VI: EOMI. Eyelids elevate symmetrically.  V: Facial sensation is intact and symmetric to light touch and cool temperature.  VII: Face symmetric resting and smiling.  VIII: Hearing intact to voice. IX, X: Phonation is normal.  XI: Shoulder shrug 5/5. XII: Tongue protrudes midline without fasciculations. Motor:  Right  - upper extremity 4-/5 strength - knee extension 4/5 strength - ankle dorsiflexion and plantar flexion 4/5 Left: - upper extremity 4/5 strength - knee extension 4-/5 strength - ankle dorsiflexion and plantarflexion 4-/5 Sensory:  Sensation to cool temperature intact and symmetric throughout the  upper and lower extremities and face. Vibratory sensation decreased distally bilaterally with RLE vibratory sensation slightly decreased compared to the LLE. Vibratory sensation normalizes more proximally in bilateral lower extremities. Patient endorses intact sensation to light touch bilaterally in upper and lower extremities but with some decrease in sensation to the RLE.  Proprioception intact in bilateral lower extremities. Stocking/glove paresthesias in bilateral feet with extension to the knees bilaterally, right worse than left, and the right palmar hand to the right shoulder. Denies paresthesias/sensory abnormalities in the left upper extremity.  DTR: 0 in patellae and achilles tendons bilaterally RUE: 0 biceps reflex, 1+ triceps and brachioradialis LUE: 2+ biceps, 2+ brachioradialis, 1+ triceps Coordination: FTN and HKS intact bilaterally.  Plantars: Toes downgoing bilaterally Gait: deferred  Pertinent Labs: CBC    Component Value Date/Time   WBC 14.5 (H) 08/15/2020 0245   RBC 5.07 08/15/2020 0245   HGB 14.0 08/15/2020 0245   HCT 43.0 08/15/2020 0245   PLT 352 08/15/2020 0245   MCV 84.8 08/15/2020 0245   MCH 27.6 08/15/2020 0245   MCHC 32.6 08/15/2020 0245   RDW 12.7 08/15/2020 0245   LYMPHSABS 1.3 08/15/2020 0245   MONOABS 1.0 08/15/2020 0245   EOSABS 0.1 08/15/2020 0245   BASOSABS 0.0 08/15/2020 0245  CMP     Component Value Date/Time   NA 130 (L) 08/15/2020 0245   K 3.5 08/15/2020 0245   CL 98 08/15/2020 0245   CO2 22 08/15/2020 0245   GLUCOSE 136 (H) 08/15/2020 0245   BUN 21 08/15/2020 0245   CREATININE 1.08 08/15/2020 0245   CALCIUM 8.7 (L) 08/15/2020 0245   PROT 7.4 08/15/2020 0245   ALBUMIN 3.1 (L)  08/15/2020 0245   AST 16 08/15/2020 0245   ALT 17 08/15/2020 0245   ALKPHOS 67 08/15/2020 0245   BILITOT 1.1 08/15/2020 0245   GFRNONAA >60 08/15/2020 0245   Lab Results  Component Value Date   CHOL 276 (H) 08/14/2020   HDL 33 (L) 08/14/2020   LDLCALC 192  (H) 08/14/2020   TRIG 253 (H) 08/14/2020   CHOLHDL 8.4 08/14/2020   Lab Results  Component Value Date   HGBA1C 7.0 (H) 08/14/2020   Lab Results  Component Value Date   VITAMINB12 187 08/14/2020   08/13/20 MRI brain IMPRESSION: 1. No evidence of acute intracranial abnormality on this motion limited exam. No acute infarct. 2. Nonspecific paranasal sinus disease with air-fluid level in the left maxillary sinus. Correlate with signs/symptoms of sinusitis.  08/16/20 MRI spinal imaging IMPRESSION: 1. No acute injury of the cervical, thoracic, and lumbar spine. 2. Mild spondylosis of the cervical, thoracic and lumbar spine.  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  - Symptoms/signs include new onset weakness, lower extremity areflexia and sensory loss in multiple modalities in a stocking glove distribution, but sparing the left arm.  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  - There has been some improvement with IVIG. 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  - MRI brain without acute intracranial abnormality. MRI of cervical, thoracic and lumbar spine without evidence for a myelopathy.   Recommendations: - Continue IVIG therapy. Today is day 4 (start 08/14/20, end 08/18/20) - Continue Vitamin B12 supplementation, but will change to high-dose SQ therapy of 1000 mcg qd x 7 days, then 1000 mcg SQ qweek x 4 weeks, then 1000 mcg qmonth thereafter.  - PT/OT  - Refusing LP at this time for diagnostic support - Consider inpatient rehab at discharge - Neurology will continue to follow   Jacob Cameron, AGACNP-BC [facility] [phone]  Electronically signed: Dr. Caryl Pina

## 2020-08-18 DIAGNOSIS — M792 Neuralgia and neuritis, unspecified: Secondary | ICD-10-CM | POA: Diagnosis not present

## 2020-08-18 DIAGNOSIS — R202 Paresthesia of skin: Secondary | ICD-10-CM | POA: Diagnosis not present

## 2020-08-18 DIAGNOSIS — G610 Guillain-Barre syndrome: Secondary | ICD-10-CM | POA: Diagnosis not present

## 2020-08-18 LAB — BASIC METABOLIC PANEL
Anion gap: 10 (ref 5–15)
BUN: 17 mg/dL (ref 8–23)
CO2: 22 mmol/L (ref 22–32)
Calcium: 8.4 mg/dL — ABNORMAL LOW (ref 8.9–10.3)
Chloride: 92 mmol/L — ABNORMAL LOW (ref 98–111)
Creatinine, Ser: 0.92 mg/dL (ref 0.61–1.24)
GFR, Estimated: >60 mL/min (ref >60)
Glucose, Bld: 134 mg/dL — ABNORMAL HIGH (ref 70–99)
Potassium: 4 mmol/L (ref 3.5–5.1)
Sodium: 124 mmol/L — ABNORMAL LOW (ref 135–145)

## 2020-08-18 LAB — CBC
HCT: 41.8 % (ref 39.0–52.0)
Hemoglobin: 14 g/dL (ref 13.0–17.0)
MCH: 27.5 pg (ref 26.0–34.0)
MCHC: 33.5 g/dL (ref 30.0–36.0)
MCV: 82 fL (ref 80.0–100.0)
Platelets: 362 10*3/uL (ref 150–400)
RBC: 5.1 MIL/uL (ref 4.22–5.81)
RDW: 12.3 % (ref 11.5–15.5)
WBC: 11.9 10*3/uL — ABNORMAL HIGH (ref 4.0–10.5)
nRBC: 0 % (ref 0.0–0.2)

## 2020-08-18 LAB — GLUCOSE, CAPILLARY
Glucose-Capillary: 133 mg/dL — ABNORMAL HIGH (ref 70–99)
Glucose-Capillary: 123 mg/dL — ABNORMAL HIGH (ref 70–99)
Glucose-Capillary: 120 mg/dL — ABNORMAL HIGH (ref 70–99)
Glucose-Capillary: 128 mg/dL — ABNORMAL HIGH (ref 70–99)

## 2020-08-18 LAB — SODIUM, URINE, RANDOM: Sodium, Ur: 34 mmol/L

## 2020-08-18 LAB — MAGNESIUM: Magnesium: 2 mg/dL (ref 1.7–2.4)

## 2020-08-18 LAB — OSMOLALITY: Osmolality: 270 mOsm/kg — ABNORMAL LOW (ref 275–295)

## 2020-08-18 LAB — OSMOLALITY, URINE: Osmolality, Ur: 556 mOsm/kg (ref 300–900)

## 2020-08-18 MED ORDER — HYDROMORPHONE HCL 1 MG/ML IJ SOLN
1.0000 mg | INTRAMUSCULAR | Status: DC | PRN
  Administered 2020-09-01: 1 mg via INTRAVENOUS
  Filled 2020-08-18: qty 1

## 2020-08-18 MED ORDER — SODIUM CHLORIDE 0.9 % IV SOLN: INTRAVENOUS | Status: AC

## 2020-08-18 NOTE — Progress Notes (Addendum)
Neurology Progress Note Subjective: No acute events overnight. Empiric IVIG dose 5/5 to be completed today. Patient sleeping this morning on examination, wakes to voice. Patient complains of persistent painful glove-distribution paresthesias of the right upper extremity and from the knee downwards bilaterally. States no improvement in paresthesias since yesterday's examination and claims he is still unable to ambulate but denies progression of paresthesias. Does endorse some mild nausea this morning. 10:30- On reassessment, patient endorses further weakness throughout and is drowsy sitting up. With progressive weakness, concern for progressive disease, discussed with patient and family plasma exchange therapy.   Exam: Vitals:   08/17/20 2309 08/18/20 0317  BP: (!) 152/99 (!) 135/92  Pulse: 91 94  Resp: 15 17  Temp: 98.3 F (36.8 C) 97.8 F (36.6 C)  SpO2: 96% 98%   Gen: Sleeping in bed, no acute distress, wakes to voice Resp: non-labored breathing, no acute distress Abd: soft, non-tender, non-distended  Neuro: Mental Status: awake, alert, oriented to age, month, time, place, and situation. Patient is able to give a clear and coherent history. Follows all commands. Speech is fluent without dysarthria or aphasia. No signs of neglect noted.  CN: II: PERRL 3 mm/brisk. Visual fields full.  III, IV, VI: EOMI. Eyelids elevate symmetrically.  V: Facial sensation is intact and symmetric to light touch and cool temperature.  VII: Face symmetric resting and smiling.  VIII: Hearing intact to voice. IX, X: Phonation is normal.  XI: Shoulder shrug 5/5. XII: Tongue protrudes midline without fasciculations. Motor:  Right  - Upper extremity 4-/5 strength - Knee extension 4-/5 strength - Ankle dorsiflexion and plantar flexion 4/5 Left: - Upper extremity 4/5 strength - Knee extension 4-/5 strength - Ankle dorsiflexion and plantarflexion 4-/5 Sensory:  Sensation to cool temperature intact and  symmetric throughout the upper and lower extremities and face.  Vibratory sensation decreased distally bilaterally with RLE vibratory sensation slightly decreased compared to the LLE and normalizes more proximally in bilateral lower extremities.  Patient endorses intact sensation to light touch bilaterally in upper and lower extremities with some decrease in sensation to the RLE compared to the LLE. Proprioception intact in bilateral lower extremities. Stocking/glove paresthesias in bilateral feet with extension to the knees bilaterally, right worse than left, and the right palmar hand to the right shoulder. Denies paresthesias/sensory abnormalities in the left upper extremity.  DTR: 0 in patellae and achilles tendons bilaterally RUE: 1+ biceps reflex and brachioradialis, 0 triceps  LUE: 1+ biceps, 1+ brachioradialis, 1+ triceps Coordination: FTN and HKS intact bilaterally.  Plantars: Toes downgoing bilaterally Gait: Deferred  Pertinent Labs: CBC    Component Value Date/Time   WBC 11.9 (H) 08/18/2020 0105   RBC 5.10 08/18/2020 0105   HGB 14.0 08/18/2020 0105   HCT 41.8 08/18/2020 0105   PLT 362 08/18/2020 0105   MCV 82.0 08/18/2020 0105   MCH 27.5 08/18/2020 0105   MCHC 33.5 08/18/2020 0105   RDW 12.3 08/18/2020 0105   LYMPHSABS 1.3 08/15/2020 0245   MONOABS 1.0 08/15/2020 0245   EOSABS 0.1 08/15/2020 0245   BASOSABS 0.0 08/15/2020 0245  CMP     Component Value Date/Time   NA 124 (L) 08/18/2020 0105   K 4.0 08/18/2020 0105   CL 92 (L) 08/18/2020 0105   CO2 22 08/18/2020 0105   GLUCOSE 134 (H) 08/18/2020 0105   BUN 17 08/18/2020 0105   CREATININE 0.92 08/18/2020 0105   CALCIUM 8.4 (L) 08/18/2020 0105   PROT 7.4 08/15/2020 0245   ALBUMIN 3.1 (  L) 08/15/2020 0245   AST 16 08/15/2020 0245   ALT 17 08/15/2020 0245   ALKPHOS 67 08/15/2020 0245   BILITOT 1.1 08/15/2020 0245   GFRNONAA >60 08/18/2020 0105   Assessment: Ascending sensory and motor polyradiculoneuropathy with  sudden onset and rapid progression.  - Symptoms/signs include new onset and progressive weakness in all 4 extremities, lower extremity areflexia, upper extremity asymmetric hyporeflexia and sensory loss in multiple modalities exhibiting a stocking glove distribution, but sparing the left arm.  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  - Hyponatremia noted on lab work with Na at 124. 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 Patient euvolemic on fluid restriction today.  - There has been some improvement with IVIG over the course of treatment; however, symptoms remain disabling with progression as of 08/18/20. Discussed with patient and family the risks and benefits of plasma exchange therapy.  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  - MRI brain without acute intracranial abnormality. MRI of cervical, thoracic and lumbar spine without evidence for a myelopathy.   Recommendations: - IVIG empiric therapy dose 5/5 to be completed today - Continue Vitamin B12 supplementation: high-dose SQ therapy of 1,000 mcg qd x 7 days, then 1000 mcg SQ qweek x 4 weeks, then 1000 mcg qmonth thereafter.  - Support metabolic derangements; hyponatremic today with Na of 124 - PT/OT  - Refusing LP at this time for further diagnostic support - Consider inpatient rehab at discharge - Increasing weakness despite IVIG. Family has helped patient with informed decision making and language interpretation and decision has been made to initiate plasma exchange therapy  Lanae Boast, AGACNP-BC [facility] [phone]  Electronically signed: Dr. Caryl Pina

## 2020-08-18 NOTE — Plan of Care (Signed)
Neurology Plan of Care Note Chart flagged with attempt at ordering plasma exchange indicating patient with refusal of accepting blood products. NP spoke with son and patient at bedside about refusal of blood products. Patient and son verbalized that patient does not have any religious or personal contraindications opposing accepting blood or blood products and that he consents for albumin administration with plasma exchange. Patient further verbally consents to any blood/blood products necessary for treatment at this time.  No charge note Lanae Boast, AGACNP-BC [facility] [phone]

## 2020-08-18 NOTE — Progress Notes (Signed)
Inpatient Rehab Admissions Coordinator:   Met with patient and his son at the bedside to discuss CIR.  Pt open to CIR now, but note plans for plasma exchange.  Will follow for POC as this would need to be completed before CIR.    Shann Medal, PT, DPT Admissions Coordinator [phone] 08/18/20  12:55 PM

## 2020-08-18 NOTE — Progress Notes (Signed)
PROGRESS NOTE    [name]  KXF:[id number] DOB: 1956-06-26 DOA: 08/12/2020 PCP: Patient, No Pcp Per   Brief Narrative:  Jacob Cameron a 65 y.o.malewithout medical history, s/p J&J covid vaccine April 2021 who presented with paresthesias in the right hand and feet with associated pain that has progressed over the previous few days now causing him to be unable to walk. MRI brain was nonacute, so he was admitted to [facility] for neurology evaluation on the advice of teleneurology with MRI C/T/L spine pending. Neurology has initiated IVIG empirically. Symptoms are stable and severely disabling. PT and OT recommend CIR at this time. He has undergone MRI on 2/6 with no acute findings. Refusing LP.  -He will finish his IVIG on 2/8 and then will need placement.  Also noted to be hyponatremic on 2/8 for which IV fluid and fluid restriction started with lab work ordered and pending.  Ongoing need for pain management.  Assessment & Plan:   Principal Problem:   Paresthesias Active Problems:   Tobacco abuse   Polyneuropathy, idiopathic progressive  Distal polyneuropathy: Unclear etiology. Vitamin B12 level wnl at 187. CK wnl. ESR 20, CRP 1.6.  - Neurology directing management:Continuing with IVIGdaily x5 daysfor empiric Tx (end 2/8) - B12 supplementation due to low-normal level. - Pt reports he's willing to try MRI. D/w RN, neuro, and reordered prn ativan to assist with anxiety/claustrophobia.  - Decision Re: LP refused - Continue gabapentin low dose. - Therapy recommending CIR. Pt is currently severely debilitated by symptoms and would not be able to return home safely. PM&R consulted. -Increase Dilaudid to 1 mg every 3 hours as needed for pain control  Hyponatremia -Appears to have acutely worsened this a.m. 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  T2DM: HbA1c 7%, new diagnosis. Given lack of significant symptoms, do not believe this is likely to have been present long enough or severe enough to precipitate acute polyneuropathy. - Diabetes coordinator and dietitian consulted - Start metformin after we know contrasted exams will no longer be neededand after GI symptoms have resolved.  - Start sensitive SSI, CBGs at inpatient goal thus far, though very little po intake.  Neutrophilic leukocytosis: Afebrile. No meningismus on exam.WBC coming down. - Continue monitoring off antimicrobial therapy.Remains afebrile.ESR, CRP not indicative of severe inflammatory state.   Hypertension -Started amlodipine 5mg  daily on 2/7, with now improved control noted  Dyslipidemia: LDL 192. LFTs wnl.  - Startedrosuvastatin  Tobacco use:  - Cessation counseling  Elevated TSH: Mildly elevated at 5.311. T3, T4 wnl.  - Recheck in 4-6 weeks recommended  DVT prophylaxis:Lovenox Code Status:Full Family Communication:Spoke with son on phone 2/7 Disposition Plan: Status is: Inpatient due to need for additional diagnostic testing and treatment with IV therapies due to severity of illness.   Dispo: The patient is from: Home Anticipated d/c is to:CIR Anticipated d/c date is: 2-3 days Patient currently is not medically stable to d/c.  Consultants:  Neurology  Procedures:  None  Antimicrobials:  None    Subjective: Patient seen and evaluated today with ongoing complaints of pain noted.  He appears to be eating and drinking okay.  Objective: Vitals:   08/17/20 2112 08/17/20 2309 08/18/20 0317 08/18/20 0815  BP: (!) 162/101 (!) 152/99 Marland Kitchen)  135/92 (!) 138/93  Pulse: 90 91 94 96  Resp: $Remo'18 15 17 18  'ePXuh$ Temp: (!) 97.4 F (36.3 C) 98.3 F (36.8 C) 97.8 F (36.6 C) 98.1 F (36.7 C)   TempSrc: Oral Oral Oral Oral  SpO2: 98% 96% 98% 98%  Weight:      Height:        Intake/Output Summary (Last 24 hours) at 08/18/2020 0836 Last data filed at 08/18/2020 0100 Gross per 24 hour  Intake -  Output 1150 ml  Net -1150 ml   Filed Weights   08/12/20 2053  Weight: 80.3 kg    Examination:  General exam: Appears calm and comfortable  Respiratory system: Clear to auscultation. Respiratory effort normal. Cardiovascular system: S1 & S2 heard, RRR.  Gastrointestinal system: Abdomen is soft Central nervous system: Alert and awake Extremities: No edema Skin: No significant lesions noted Psychiatry: Flat affect.    Data Reviewed: I have personally reviewed following labs and imaging studies  CBC: Recent Labs  Lab 08/15/20 0245 08/18/20 0105  WBC 14.5* 11.9*  NEUTROABS 12.0*  --   HGB 14.0 14.0  HCT 43.0 41.8  MCV 84.8 82.0  PLT 352 732   Basic Metabolic Panel: Recent Labs  Lab 08/15/20 0245 08/18/20 0105  NA 130* 124*  K 3.5 4.0  CL 98 92*  CO2 22 22  GLUCOSE 136* 134*  BUN 21 17  CREATININE 1.08 0.92  CALCIUM 8.7* 8.4*  MG  --  2.0   GFR: Estimated Creatinine Clearance: 80.2 mL/min (by C-G formula based on SCr of 0.92 mg/dL). Liver Function Tests: Recent Labs  Lab 08/15/20 0245  AST 16  ALT 17  ALKPHOS 67  BILITOT 1.1  PROT 7.4  ALBUMIN 3.1*   No results for input(s): LIPASE, AMYLASE in the last 168 hours. No results for input(s): AMMONIA in the last 168 hours. Coagulation Profile: No results for input(s): INR, PROTIME in the last 168 hours. Cardiac Enzymes: No results for input(s): CKTOTAL, CKMB, CKMBINDEX, TROPONINI in the last 168 hours. BNP (last 3 results) No results for input(s): PROBNP in the last 8760 hours. HbA1C: No results for input(s): HGBA1C in the last 72 hours. CBG: Recent Labs  Lab 08/17/20 0611 08/17/20 1150 08/17/20 1600 08/17/20 2107 08/18/20 0615  GLUCAP 140* 115* 139* 129* 133*   Lipid Profile: No results  for input(s): CHOL, HDL, LDLCALC, TRIG, CHOLHDL, LDLDIRECT in the last 72 hours. Thyroid Function Tests: No results for input(s): TSH, T4TOTAL, FREET4, T3FREE, THYROIDAB in the last 72 hours. Anemia Panel: No results for input(s): VITAMINB12, FOLATE, FERRITIN, TIBC, IRON, RETICCTPCT in the last 72 hours. Sepsis Labs: No results for input(s): PROCALCITON, LATICACIDVEN in the last 168 hours.  Recent Results (from the past 240 hour(s))  SARS CORONAVIRUS 2 (TAT 6-24 HRS) Nasopharyngeal Nasopharyngeal Swab     Status: None   Collection Time: 08/11/20  2:31 AM   Specimen: Nasopharyngeal Swab  Result Value Ref Range Status   SARS Coronavirus 2 NEGATIVE NEGATIVE Final    Comment: (NOTE) SARS-CoV-2 target nucleic acids are NOT DETECTED.  The SARS-CoV-2 RNA is generally detectable in upper and lower respiratory specimens during the acute phase of infection. Negative results do not preclude SARS-CoV-2 infection, do not rule out co-infections with other pathogens, and should not be used as the sole basis for treatment or other patient management decisions. Negative results must be combined with clinical observations, patient history, and epidemiological information. The expected result is Negative.  Fact Sheet for Patients: SugarRoll.be  Fact Sheet for Healthcare Providers: https://www.woods-mathews.com/  This test is not yet approved or cleared by the [place] FDA and  has been authorized for detection and/or diagnosis of SARS-CoV-2 by FDA under an Emergency Use Authorization (EUA). This EUA will remain  in effect (meaning this test can be used) for the duration of the COVID-19 declaration under Se ction 564(b)(1) of the Act, 21 U.S.C. section 360bbb-3(b)(1), unless the authorization is terminated or revoked sooner.  Performed at [facility], [place] [address], [place], [place] [address]   SARS Coronavirus 2 by RT PCR (hospital order,  performed in [facility] hospital lab) Nasopharyngeal Nasopharyngeal Swab     Status: None   Collection Time: 08/13/20  1:50 AM   Specimen: Nasopharyngeal Swab  Result Value Ref Range Status   SARS Coronavirus 2 NEGATIVE NEGATIVE Final    Comment: (NOTE) SARS-CoV-2 target nucleic acids are NOT DETECTED.  The SARS-CoV-2 RNA is generally detectable in upper and lower respiratory specimens during the acute phase of infection. The lowest concentration of SARS-CoV-2 viral copies this assay can detect is 250 copies / mL. A negative result does not preclude SARS-CoV-2 infection and should not be used as the sole basis for treatment or other patient management decisions.  A negative result may occur with improper specimen collection / handling, submission of specimen other than nasopharyngeal swab, presence of viral mutation(s) within the areas targeted by this assay, and inadequate number of viral copies (<250 copies / mL). A negative result must be combined with clinical observations, patient history, and epidemiological information.  Fact Sheet for Patients:   StrictlyIdeas.no  Fact Sheet for Healthcare Providers: BankingDealers.co.za  This test is not yet approved or  cleared by the [place] FDA and has been authorized for detection and/or diagnosis of SARS-CoV-2 by FDA under an Emergency Use Authorization (EUA).  This EUA will remain in effect (meaning this test can be used) for the duration of the COVID-19 declaration under Section 564(b)(1) of the Act, 21 U.S.C. section 360bbb-3(b)(1), unless the authorization is terminated or revoked sooner.  Performed at [facility], [address] [place], [place] [address]          Radiology Studies: MR CERVICAL SPINE WO CONTRAST  Result Date: 08/16/2020 CLINICAL DATA:  Low back pain.  Progressive neurological deficit. EXAM: MRI CERVICAL, THORACIC AND LUMBAR SPINE WITHOUT  CONTRAST TECHNIQUE: Multiplanar and multiecho pulse sequences of the cervical spine, to include the craniocervical junction and cervicothoracic junction, and thoracic and lumbar spine, were obtained without intravenous contrast. COMPARISON:  None. FINDINGS: Patient motion degrades image quality limiting evaluation. MRI CERVICAL SPINE FINDINGS Alignment: Physiologic. Vertebrae: No fracture, evidence of discitis, or bone lesion. Cord: Normal signal and morphology. Posterior Fossa, vertebral arteries, paraspinal tissues: Posterior fossa demonstrates no focal abnormality. Vertebral artery flow voids are maintained. Paraspinal soft tissues are unremarkable. Disc levels: Discs: Degenerative disease with disc height loss C3-4, C4-5 and C5-6. C2-3: No significant disc bulge. No neural foraminal stenosis. No central canal stenosis. C3-4: No significant disc bulge. Severe right foraminal stenosis. Mild left foraminal stenosis. No central canal stenosis. C4-5: No significant disc bulge. Bilateral uncovertebral degenerative changes. Severe bilateral foraminal stenosis. No central canal stenosis. C5-6: Broad-based disc bulge. Mild right and severe left foraminal stenosis. No central canal stenosis. C6-7: No significant disc bulge. Mild left foraminal stenosis. No right foraminal stenosis. No central canal stenosis. C7-T1: No significant disc bulge. No neural foraminal stenosis. No central canal stenosis. MRI THORACIC  SPINE FINDINGS Alignment:  Physiologic. Vertebrae: No acute fracture, discitis osteomyelitis, or aggressive osseous lesion. T6 vertebral body hemangioma. L1 vertebral body hemangioma. Cord:  Normal signal and morphology. Paraspinal and other soft tissues: No acute paraspinal abnormality. Disc levels: Disc spaces:  Degenerative disease with disc height loss at T6-7. T1-T2: No disc protrusion, foraminal stenosis or central canal stenosis. T2-T3: No disc protrusion, foraminal stenosis or central canal stenosis. T3-T4:  No disc protrusion, foraminal stenosis or central canal stenosis. T4-T5: No disc protrusion, foraminal stenosis or central canal stenosis. T5-T6: No disc protrusion, foraminal stenosis or central canal stenosis. T6-T7: Tiny right paracentral disc protrusion. No foraminal or central canal stenosis. T7-T8: No disc protrusion, foraminal stenosis or central canal stenosis. T8-T9: No disc protrusion, foraminal stenosis or central canal stenosis. T9-T10: No disc protrusion, foraminal stenosis or central canal stenosis. T10-T11: No disc protrusion, foraminal stenosis or central canal stenosis. T11-T12: Mild broad-based disc bulge. No foraminal or central canal stenosis. MRI LUMBAR SPINE FINDINGS Segmentation: Transitional anatomy of the lumbar spine with sacralization of the L5 vertebral body. Alignment: Minimal grade 1 anterolisthesis of L4 on L5 secondary to facet disease. Vertebrae:  No fracture, evidence of discitis, or bone lesion. Conus medullaris and cauda equina: Conus extends to the T12-L1 level. Conus and cauda equina appear normal. Paraspinal and other soft tissues: No acute paraspinal abnormality. Disc levels: Disc spaces: Disc desiccation of the lumbar spine. T12-L1: Mild broad-based disc bulge with a tiny central disc protrusion. No evidence of neural foraminal stenosis. No central canal stenosis. L1-L2: Mild broad-based disc bulge. Mild bilateral facet arthropathy. No evidence of neural foraminal stenosis. No central canal stenosis. L2-L3: No significant disc bulge. No evidence of neural foraminal stenosis. No central canal stenosis. L3-L4: Minimal broad-based disc bulge. No evidence of neural foraminal stenosis. No central canal stenosis. L4-L5: Broad-based disc bulge. Severe bilateral facet arthropathy with bilateral facet effusions. Bilateral subarticular recess narrowing. No evidence of neural foraminal stenosis. No central canal stenosis. L5-S1: No significant disc bulge. No evidence of neural foraminal  stenosis. No central canal stenosis. IMPRESSION: 1. No acute injury of the cervical, thoracic, and lumbar spine. 2. Mild spondylosis of the cervical, thoracic and lumbar spine. Electronically Signed   By: Kathreen Devoid   On: 08/16/2020 13:59   MR THORACIC SPINE WO CONTRAST  Result Date: 08/16/2020 CLINICAL DATA:  Low back pain.  Progressive neurological deficit. EXAM: MRI CERVICAL, THORACIC AND LUMBAR SPINE WITHOUT CONTRAST TECHNIQUE: Multiplanar and multiecho pulse sequences of the cervical spine, to include the craniocervical junction and cervicothoracic junction, and thoracic and lumbar spine, were obtained without intravenous contrast. COMPARISON:  None. FINDINGS: Patient motion degrades image quality limiting evaluation. MRI CERVICAL SPINE FINDINGS Alignment: Physiologic. Vertebrae: No fracture, evidence of discitis, or bone lesion. Cord: Normal signal and morphology. Posterior Fossa, vertebral arteries, paraspinal tissues: Posterior fossa demonstrates no focal abnormality. Vertebral artery flow voids are maintained. Paraspinal soft tissues are unremarkable. Disc levels: Discs: Degenerative disease with disc height loss C3-4, C4-5 and C5-6. C2-3: No significant disc bulge. No neural foraminal stenosis. No central canal stenosis. C3-4: No significant disc bulge. Severe right foraminal stenosis. Mild left foraminal stenosis. No central canal stenosis. C4-5: No significant disc bulge. Bilateral uncovertebral degenerative changes. Severe bilateral foraminal stenosis. No central canal stenosis. C5-6: Broad-based disc bulge. Mild right and severe left foraminal stenosis. No central canal stenosis. C6-7: No significant disc bulge. Mild left foraminal stenosis. No right foraminal stenosis. No central canal stenosis. C7-T1: No significant disc bulge. No neural  foraminal stenosis. No central canal stenosis. MRI THORACIC SPINE FINDINGS Alignment:  Physiologic. Vertebrae: No acute fracture, discitis osteomyelitis, or  aggressive osseous lesion. T6 vertebral body hemangioma. L1 vertebral body hemangioma. Cord:  Normal signal and morphology. Paraspinal and other soft tissues: No acute paraspinal abnormality. Disc levels: Disc spaces:  Degenerative disease with disc height loss at T6-7. T1-T2: No disc protrusion, foraminal stenosis or central canal stenosis. T2-T3: No disc protrusion, foraminal stenosis or central canal stenosis. T3-T4: No disc protrusion, foraminal stenosis or central canal stenosis. T4-T5: No disc protrusion, foraminal stenosis or central canal stenosis. T5-T6: No disc protrusion, foraminal stenosis or central canal stenosis. T6-T7: Tiny right paracentral disc protrusion. No foraminal or central canal stenosis. T7-T8: No disc protrusion, foraminal stenosis or central canal stenosis. T8-T9: No disc protrusion, foraminal stenosis or central canal stenosis. T9-T10: No disc protrusion, foraminal stenosis or central canal stenosis. T10-T11: No disc protrusion, foraminal stenosis or central canal stenosis. T11-T12: Mild broad-based disc bulge. No foraminal or central canal stenosis. MRI LUMBAR SPINE FINDINGS Segmentation: Transitional anatomy of the lumbar spine with sacralization of the L5 vertebral body. Alignment: Minimal grade 1 anterolisthesis of L4 on L5 secondary to facet disease. Vertebrae:  No fracture, evidence of discitis, or bone lesion. Conus medullaris and cauda equina: Conus extends to the T12-L1 level. Conus and cauda equina appear normal. Paraspinal and other soft tissues: No acute paraspinal abnormality. Disc levels: Disc spaces: Disc desiccation of the lumbar spine. T12-L1: Mild broad-based disc bulge with a tiny central disc protrusion. No evidence of neural foraminal stenosis. No central canal stenosis. L1-L2: Mild broad-based disc bulge. Mild bilateral facet arthropathy. No evidence of neural foraminal stenosis. No central canal stenosis. L2-L3: No significant disc bulge. No evidence of neural  foraminal stenosis. No central canal stenosis. L3-L4: Minimal broad-based disc bulge. No evidence of neural foraminal stenosis. No central canal stenosis. L4-L5: Broad-based disc bulge. Severe bilateral facet arthropathy with bilateral facet effusions. Bilateral subarticular recess narrowing. No evidence of neural foraminal stenosis. No central canal stenosis. L5-S1: No significant disc bulge. No evidence of neural foraminal stenosis. No central canal stenosis. IMPRESSION: 1. No acute injury of the cervical, thoracic, and lumbar spine. 2. Mild spondylosis of the cervical, thoracic and lumbar spine. Electronically Signed   By: Elige Ko   On: 08/16/2020 13:59   MR LUMBAR SPINE WO CONTRAST  Result Date: 08/16/2020 CLINICAL DATA:  Low back pain.  Progressive neurological deficit. EXAM: MRI CERVICAL, THORACIC AND LUMBAR SPINE WITHOUT CONTRAST TECHNIQUE: Multiplanar and multiecho pulse sequences of the cervical spine, to include the craniocervical junction and cervicothoracic junction, and thoracic and lumbar spine, were obtained without intravenous contrast. COMPARISON:  None. FINDINGS: Patient motion degrades image quality limiting evaluation. MRI CERVICAL SPINE FINDINGS Alignment: Physiologic. Vertebrae: No fracture, evidence of discitis, or bone lesion. Cord: Normal signal and morphology. Posterior Fossa, vertebral arteries, paraspinal tissues: Posterior fossa demonstrates no focal abnormality. Vertebral artery flow voids are maintained. Paraspinal soft tissues are unremarkable. Disc levels: Discs: Degenerative disease with disc height loss C3-4, C4-5 and C5-6. C2-3: No significant disc bulge. No neural foraminal stenosis. No central canal stenosis. C3-4: No significant disc bulge. Severe right foraminal stenosis. Mild left foraminal stenosis. No central canal stenosis. C4-5: No significant disc bulge. Bilateral uncovertebral degenerative changes. Severe bilateral foraminal stenosis. No central canal stenosis.  C5-6: Broad-based disc bulge. Mild right and severe left foraminal stenosis. No central canal stenosis. C6-7: No significant disc bulge. Mild left foraminal stenosis. No right foraminal stenosis. No central  canal stenosis. C7-T1: No significant disc bulge. No neural foraminal stenosis. No central canal stenosis. MRI THORACIC SPINE FINDINGS Alignment:  Physiologic. Vertebrae: No acute fracture, discitis osteomyelitis, or aggressive osseous lesion. T6 vertebral body hemangioma. L1 vertebral body hemangioma. Cord:  Normal signal and morphology. Paraspinal and other soft tissues: No acute paraspinal abnormality. Disc levels: Disc spaces:  Degenerative disease with disc height loss at T6-7. T1-T2: No disc protrusion, foraminal stenosis or central canal stenosis. T2-T3: No disc protrusion, foraminal stenosis or central canal stenosis. T3-T4: No disc protrusion, foraminal stenosis or central canal stenosis. T4-T5: No disc protrusion, foraminal stenosis or central canal stenosis. T5-T6: No disc protrusion, foraminal stenosis or central canal stenosis. T6-T7: Tiny right paracentral disc protrusion. No foraminal or central canal stenosis. T7-T8: No disc protrusion, foraminal stenosis or central canal stenosis. T8-T9: No disc protrusion, foraminal stenosis or central canal stenosis. T9-T10: No disc protrusion, foraminal stenosis or central canal stenosis. T10-T11: No disc protrusion, foraminal stenosis or central canal stenosis. T11-T12: Mild broad-based disc bulge. No foraminal or central canal stenosis. MRI LUMBAR SPINE FINDINGS Segmentation: Transitional anatomy of the lumbar spine with sacralization of the L5 vertebral body. Alignment: Minimal grade 1 anterolisthesis of L4 on L5 secondary to facet disease. Vertebrae:  No fracture, evidence of discitis, or bone lesion. Conus medullaris and cauda equina: Conus extends to the T12-L1 level. Conus and cauda equina appear normal. Paraspinal and other soft tissues: No acute  paraspinal abnormality. Disc levels: Disc spaces: Disc desiccation of the lumbar spine. T12-L1: Mild broad-based disc bulge with a tiny central disc protrusion. No evidence of neural foraminal stenosis. No central canal stenosis. L1-L2: Mild broad-based disc bulge. Mild bilateral facet arthropathy. No evidence of neural foraminal stenosis. No central canal stenosis. L2-L3: No significant disc bulge. No evidence of neural foraminal stenosis. No central canal stenosis. L3-L4: Minimal broad-based disc bulge. No evidence of neural foraminal stenosis. No central canal stenosis. L4-L5: Broad-based disc bulge. Severe bilateral facet arthropathy with bilateral facet effusions. Bilateral subarticular recess narrowing. No evidence of neural foraminal stenosis. No central canal stenosis. L5-S1: No significant disc bulge. No evidence of neural foraminal stenosis. No central canal stenosis. IMPRESSION: 1. No acute injury of the cervical, thoracic, and lumbar spine. 2. Mild spondylosis of the cervical, thoracic and lumbar spine. Electronically Signed   By: Kathreen Devoid   On: 08/16/2020 13:59        Scheduled Meds: . amLODipine  5 mg Oral Daily  . cyanocobalamin  1,000 mcg Subcutaneous Daily  . [START ON 08/24/2020] cyanocobalamin  1,000 mcg Intramuscular Q7 days  . enoxaparin (LOVENOX) injection  40 mg Subcutaneous Q24H  . gabapentin  100 mg Oral TID  . insulin aspart  0-6 Units Subcutaneous TID WC  . rosuvastatin  5 mg Oral Daily   Continuous Infusions: . sodium chloride    . Immune Globulin 10% Stopped (08/18/20 0456)     LOS: 4 days    Time spent: 35 minutes    Mance Vallejo D Manuella Ghazi, DO [facility]  If 7PM-7AM, please contact night-coverage [email or web address] 08/18/2020, 8:36 AM

## 2020-08-18 NOTE — Progress Notes (Signed)
RT note: NIF -25 with good effort. Pt will need some coaching to perform. VC unavailable at this time. RT will attempt VC at a a later time.

## 2020-08-18 NOTE — Progress Notes (Signed)
Physical Therapy Treatment Patient Details Name: Jacob Cameron MRN: [id number] DOB: 1955-11-13 Today's Date: 08/18/2020    History of Present Illness Pt is a 65 y.o. male presenting with R neck pain and radiculopathy down his R arm with bil leg pain, parasthesias, and weakness. Symptoms began in legs then arm then neck. MRI showing no acute change in head.    PT Comments    Pt agreeable to therapy, states that he feels his status is the same as past few days. Continues to have feelings of pins and needles in BLE's and UE's. Pt able to come to EOB with min HHA for elevation of trunk from flat bed. Mod A +2 for power up from bed and recliner. Performed multiple times for strengthening. Pt has difficulty stepping feet in standing due to B knees buckling. Mod A +2 to pivot to recliner and performed standing pregait activities. Continue to recommend CIR. PT will continue to follow.    Follow Up Recommendations  CIR;Supervision for mobility/OOB     Equipment Recommendations  Rolling walker with 5" wheels;3in1 (PT);Wheelchair (measurements PT);Wheelchair cushion (measurements PT)    Recommendations for Other Services Rehab consult     Precautions / Restrictions Precautions Precautions: Fall Precaution Comments: Translator helpful Restrictions Weight Bearing Restrictions: No    Mobility  Bed Mobility Overal bed mobility: Needs Assistance Bed Mobility: Supine to Sit     Supine to sit: Min assist     General bed mobility comments: min HHA from flat bed to elevate trunk away from mattress. Pt able to bring BLE's off EOB  Transfers Overall transfer level: Needs assistance Equipment used: Rolling walker (2 wheeled) Transfers: Sit to/from [employer] Sit to Stand: Mod assist;+2 safety/equipment Stand pivot transfers: Mod assist;+2 physical assistance       General transfer comment: mod A for power up. Performed 4x for strengthening from both bed and recliner. B knees  buckling with stepping to recliner  Ambulation/Gait Ambulation/Gait assistance: Mod assist;+2 physical assistance   Assistive device: Rolling walker (2 wheeled)   Gait velocity: reduced   General Gait Details: pregait activity performed in standing in front of chair including marching in place and wt shifting. Pt could not safely move fwd today due to knees buckling when stepping   Stairs             Wheelchair Mobility    Modified Rankin (Stroke Patients Only) Modified Rankin (Stroke Patients Only) Pre-Morbid Rankin Score: No symptoms Modified Rankin: Moderately severe disability     Balance Overall balance assessment: Needs assistance Sitting-balance support: Feet supported;No upper extremity supported Sitting balance-Leahy Scale: Fair Sitting balance - Comments: Able to maintain static sitting without UE, ocasional posterior lean, self correction with tactile cues   Standing balance support: Bilateral upper extremity supported;During functional activity Standing balance-Leahy Scale: Poor Standing balance comment: Mod-Max A for maintaining standing balance. Use or Rw for UE support                            Cognition Arousal/Alertness: Awake/alert Behavior During Therapy: WFL for tasks assessed/performed Overall Cognitive Status: Within Functional Limits for tasks assessed                                 General Comments: A&Ox4. Some times needs increased time      Exercises General Exercises - Lower Extremity Ankle Circles/Pumps: AROM;Both;10 reps;Supine Long  Arc Quad: AROM;Both;5 reps;Seated (with light manualk resistance) Heel Slides: AROM;Both;10 reps;Supine Straight Leg Raises: AROM;Both;10 reps;Supine    General Comments General comments (skin integrity, edema, etc.): VSS      Pertinent Vitals/Pain Pain Assessment: Faces Faces Pain Scale: Hurts even more Pain Location: B UE's,  B LE's Pain Descriptors / Indicators: Pins  and needles Pain Intervention(s): Limited activity within patient's tolerance;Monitored during session    Home Living                      Prior Function            PT Goals (current goals can now be found in the care plan section) Acute Rehab PT Goals Patient Stated Goal: to improve and eventually go home PT Goal Formulation: With patient Time For Goal Achievement: 08/28/20 Potential to Achieve Goals: Good Progress towards PT goals: Progressing toward goals    Frequency    Min 3X/week      PT Plan Current plan remains appropriate    Co-evaluation              AM-PAC PT "6 Clicks" Mobility   Outcome Measure  Help needed turning from your back to your side while in a flat bed without using bedrails?: A Little Help needed moving from lying on your back to sitting on the side of a flat bed without using bedrails?: A Little Help needed moving to and from a bed to a chair (including a wheelchair)?: A Lot Help needed standing up from a chair using your arms (e.g., wheelchair or bedside chair)?: A Lot Help needed to walk in hospital room?: Total Help needed climbing 3-5 steps with a railing? : Total 6 Click Score: 12    End of Session Equipment Utilized During Treatment: Gait belt Activity Tolerance: Patient tolerated treatment well Patient left: in chair;with call bell/phone within reach;with chair alarm set Nurse Communication: Mobility status PT Visit Diagnosis: Unsteadiness on feet (R26.81);Other abnormalities of gait and mobility (R26.89);Muscle weakness (generalized) (M62.81);Difficulty in walking, not elsewhere classified (R26.2);Other symptoms and signs involving the nervous system (R29.898)     Time: 5361-4431 PT Time Calculation (min) (ACUTE ONLY): 20 min  Charges:  $Gait Training: 8-22 mins                     Lyanne Co, PT  Acute Rehab Services  Pager [phone] Office [phone]    Lawana Chambers Daziya Redmond 08/18/2020, 1:12 PM

## 2020-08-19 ENCOUNTER — Inpatient Hospital Stay (HOSPITAL_COMMUNITY): Payer: Medicare Other

## 2020-08-19 DIAGNOSIS — R202 Paresthesia of skin: Secondary | ICD-10-CM | POA: Diagnosis not present

## 2020-08-19 DIAGNOSIS — M792 Neuralgia and neuritis, unspecified: Secondary | ICD-10-CM | POA: Diagnosis not present

## 2020-08-19 DIAGNOSIS — G603 Idiopathic progressive neuropathy: Secondary | ICD-10-CM

## 2020-08-19 HISTORY — PX: IR US GUIDE VASC ACCESS RIGHT: IMG2390

## 2020-08-19 HISTORY — PX: IR FLUORO GUIDE CV LINE RIGHT: IMG2283

## 2020-08-19 LAB — BASIC METABOLIC PANEL
Anion gap: 9 (ref 5–15)
BUN: 19 mg/dL (ref 8–23)
CO2: 22 mmol/L (ref 22–32)
Calcium: 8.5 mg/dL — ABNORMAL LOW (ref 8.9–10.3)
Chloride: 94 mmol/L — ABNORMAL LOW (ref 98–111)
Creatinine, Ser: 0.94 mg/dL (ref 0.61–1.24)
GFR, Estimated: >60 mL/min (ref >60)
Glucose, Bld: 126 mg/dL — ABNORMAL HIGH (ref 70–99)
Potassium: 3.6 mmol/L (ref 3.5–5.1)
Sodium: 125 mmol/L — ABNORMAL LOW (ref 135–145)

## 2020-08-19 LAB — GLUCOSE, CAPILLARY
Glucose-Capillary: 125 mg/dL — ABNORMAL HIGH (ref 70–99)
Glucose-Capillary: 125 mg/dL — ABNORMAL HIGH (ref 70–99)
Glucose-Capillary: 111 mg/dL — ABNORMAL HIGH (ref 70–99)
Glucose-Capillary: 157 mg/dL — ABNORMAL HIGH (ref 70–99)

## 2020-08-19 MED ORDER — LIDOCAINE HCL 1 % IJ SOLN
INTRAMUSCULAR | Status: AC
  Filled 2020-08-19: qty 20

## 2020-08-19 MED ORDER — HEPARIN SODIUM (PORCINE) 1000 UNIT/ML IJ SOLN
INTRAMUSCULAR | Status: AC
  Filled 2020-08-19: qty 1

## 2020-08-19 MED ORDER — CHLORHEXIDINE GLUCONATE CLOTH 2 % EX PADS
6.0000 | MEDICATED_PAD | Freq: Every day | CUTANEOUS | Status: DC
  Administered 2020-08-19 – 2020-09-01 (×13): 6 via TOPICAL

## 2020-08-19 MED ORDER — LIDOCAINE HCL (PF) 1 % IJ SOLN
INTRAMUSCULAR | Status: DC | PRN
  Administered 2020-08-19: 5 mL

## 2020-08-19 NOTE — Progress Notes (Addendum)
Physical Therapy Treatment Patient Details Name: Jacob Cameron MRN: [id number] DOB: [birth year] Today's Date: 08/19/2020    History of Present Illness Pt is a 65 y.o. male presenting with R neck pain and radiculopathy down his R arm with bil leg pain, parasthesias, and weakness. Symptoms began in legs then arm then neck. MRI showing no acute change in head.    PT Comments    Pt received in bed with his son present, pt agreeable to OT/PT session. Pt's son assisted with translating during session. Per son, pt understands about 90* of English, can speak a little and is able to read in [place]. Pt demonstrating progress with activity tolerance as he was able to stand an increased number of times and for extended time of up to ~30 sec - 1 min x3 bouts today. However, as he fatigues his knees do tend to buckle, placing him at high risk for falls. Pt currently requires modA+2 for sit<>stand and stand-step transfers to the L, requiring physical assistance from therapist to advance L leg and weight shift bil. Pt appeared to be unaware he was saturated in urine. Will continue to follow acutely. Current recommendations remain appropriate.     Follow Up Recommendations  CIR;Supervision for mobility/OOB     Equipment Recommendations  Rolling walker with 5" wheels;3in1 (PT);Wheelchair (measurements PT);Wheelchair cushion (measurements PT)    Recommendations for Other Services       Precautions / Restrictions Precautions Precautions: Fall Precaution Comments: Translator helpful Restrictions Weight Bearing Restrictions: No    Mobility  Bed Mobility Overal bed mobility: Needs Assistance Bed Mobility: Supine to Sit     Supine to sit: HOB elevated;Supervision     General bed mobility comments: supervision as pt progressed to EOB with HOB elevated, cues for sequencing  Transfers Overall transfer level: Needs assistance Equipment used: Rolling walker (2 wheeled) Transfers: Sit to/from [employer] Sit to Stand: Mod assist;+2 safety/equipment Stand pivot transfers: Mod assist;+2 physical assistance       General transfer comment: modAx2 to power up into standing x3 with minAx2 for standing on second trial. With 3rd trial in standing, pt with bilateral knees buckling, required return to sitting. Pt with decreased advancement of LLE with pivot toward left, required assist from therapist to advance LLE. cues to shift weight and for sequencing steps  Ambulation/Gait Ambulation/Gait assistance: Mod assist;+2 physical assistance;+2 safety/equipment Gait Distance (Feet): 2 Feet Assistive device: Rolling walker (2 wheeled) Gait Pattern/deviations: Decreased step length - left;Decreased stride length;Decreased weight shift to right;Decreased weight shift to left;Step-to pattern Gait velocity: reduced Gait velocity interpretation: <1.31 ft/sec, indicative of household ambulator General Gait Details: Pt with decreased advancement of LLE with step toward left, required assist from therapist to advance LLE. cues to shift weight and for sequencing steps   Stairs             Wheelchair Mobility    Modified Rankin (Stroke Patients Only) Modified Rankin (Stroke Patients Only) Pre-Morbid Rankin Score: No symptoms Modified Rankin: Moderately severe disability     Balance Overall balance assessment: Needs assistance Sitting-balance support: Feet supported;No upper extremity supported Sitting balance-Leahy Scale: Fair Sitting balance - Comments: able to maintain static sitting without UE support   Standing balance support: Bilateral upper extremity supported;During functional activity;Single extremity supported Standing balance-Leahy Scale: Poor Standing balance comment: Mod-Max A for maintaining standing balance. Use of RW for UE support. pt with forward flexed posture, max cues at buttocks and knees for upright posture/extension  Cognition Arousal/Alertness: Awake/alert Behavior During Therapy: WFL for tasks assessed/performed Overall Cognitive Status: Within Functional Limits for tasks assessed                                 General Comments: appeared to be unaware he was saturated in urine      Exercises      General Comments General comments (skin integrity, edema, etc.): Educated pt on performing seated marching, LAQ, and glut sets while up in recliner as HEP      Pertinent Vitals/Pain Pain Assessment: 0-10 Pain Score: 5  Pain Location: bilateral feet Pain Descriptors / Indicators: Sore;Constant Pain Intervention(s): Limited activity within patient's tolerance;Monitored during session;Repositioned    Home Living                      Prior Function            PT Goals (current goals can now be found in the care plan section) Acute Rehab PT Goals Patient Stated Goal: to improve and eventually go home PT Goal Formulation: With patient Time For Goal Achievement: 08/28/20 Potential to Achieve Goals: Good Progress towards PT goals: Progressing toward goals    Frequency    Min 3X/week      PT Plan Current plan remains appropriate    Co-evaluation PT/OT/SLP Co-Evaluation/Treatment: Yes Reason for Co-Treatment: Complexity of the patient's impairments (multi-system involvement);For patient/therapist safety;To address functional/ADL transfers PT goals addressed during session: Mobility/safety with mobility;Balance;Proper use of DME        AM-PAC PT "6 Clicks" Mobility   Outcome Measure  Help needed turning from your back to your side while in a flat bed without using bedrails?: A Little Help needed moving from lying on your back to sitting on the side of a flat bed without using bedrails?: A Little Help needed moving to and from a bed to a chair (including a wheelchair)?: A Lot Help needed standing up from a chair using your arms (e.g., wheelchair or bedside  chair)?: A Lot Help needed to walk in hospital room?: A Lot Help needed climbing 3-5 steps with a railing? : Total 6 Click Score: 13    End of Session Equipment Utilized During Treatment: Gait belt Activity Tolerance: Patient tolerated treatment well Patient left: in chair;with call bell/phone within reach;with chair alarm set;with family/visitor present Nurse Communication: Mobility status PT Visit Diagnosis: Unsteadiness on feet (R26.81);Other abnormalities of gait and mobility (R26.89);Muscle weakness (generalized) (M62.81);Difficulty in walking, not elsewhere classified (R26.2);Other symptoms and signs involving the nervous system (R29.898)     Time: 8182-9937 PT Time Calculation (min) (ACUTE ONLY): 24 min  Charges:  $Therapeutic Activity: 8-22 mins                     Raymond Gurney, PT, DPT Acute Rehabilitation Services  Pager: [phone] Office: [phone]    Jewel Baize 08/19/2020, 5:57 PM

## 2020-08-19 NOTE — Progress Notes (Signed)
PROGRESS NOTE    Jacob Cameron  LAG:[id number] DOB: 06-08-1956 DOA: 08/12/2020 PCP: Patient, No Pcp Per    Brief Narrative:  65 y.o.malewithout medical history, s/p J&J covid vaccine April 2021 who presented with paresthesias in the right hand and feet with associated pain that has progressed over the previous few days now causing him to be unable to walk. MRI brain was nonacute, so he was admitted to [facility] for neurology evaluation on the advice of teleneurology with MRI C/T/L spine pending. Neurology has initiated IVIG empirically. Symptoms are stable and severely disabling. PT and OT recommend CIR at this time.He has undergone MRI on 2/6 with no acute findings. Refusing LP.  -He will finish his IVIG on 2/8 and then will need placement.  Also noted to be hyponatremic on 2/8 for which IV fluid and fluid restriction started with lab work ordered and pending.  Ongoing need for pain management.  Assessment & Plan:   Principal Problem:   Paresthesias Active Problems:   Tobacco abuse   Polyneuropathy, idiopathic progressive   Distal polyneuropathy: Unclear etiology. Vitamin B12 level wnl at 187. CK wnl. ESR 20, CRP 1.6.  - Neurology directing management:Completed IVIGx5 daysfor empiric Tx(end 2/8) - B12 supplementation due to low-normal level. - Pt reports he's willing to try MRI. D/w RN, neuro, and reordered prn ativan to assist with anxiety/claustrophobia.  - Decision Re: LPrefused -Continuegabapentin low dose. - Therapy recommending CIR. Pt is currently severely debilitated by symptoms and would not be able to return home safely. PM&R consulted. -Per Neurology, plan for trial of plasma exchange  Hyponatremia -Appears to have acutely worsened this a.m. 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  T2DM: HbA1c 7%, new diagnosis. Given lack of significant symptoms, do not believe this is likely to have been present long enough or severe enough to precipitate acute polyneuropathy. - Diabetes coordinator and dietitian consulted - Start metformin after we know contrasted exams will no longer be neededand after GI symptoms have resolved.  - continued on SSI coverage as needed  Neutrophilic leukocytosis: Afebrile. No meningismus on exam.WBC coming down. - Continue monitoring off antimicrobial therapy.Remains afebrile.ESR, CRP not indicative of severe inflammatory state.  Hypertension -Started amlodipine 33m daily on 2/7, with now improved control noted  Dyslipidemia: LDL 192. LFTs wnl.  - Continued onrosuvastatin  Tobacco use:  - Cessation counseling  Elevated TSH: Mildly elevated at 5.311. T3, T4 wnl.  - Would repeat TSH in 4-6 weeks  DVT prophylaxis: Lovenox subq Code Status: Full Family Communication: Pt in room, family at bedside  Status is: Inpatient  Remains inpatient appropriate because:Unsafe d/c plan and IV treatments appropriate due to intensity of illness or inability to take PO   Dispo: The patient is from: Home              Anticipated d/c is to: CIR              Anticipated d/c date is: 3 days              Patient currently is not medically stable to d/c.   Difficult to place patient No    Consultants:   Neurology  IR  Procedures:     Antimicrobials: Anti-infectives (From admission, onward)  None       Subjective: Reports feeling somewhat better today  Objective: Vitals:   08/19/20 0010 08/19/20 0306 08/19/20 0827 08/19/20 1136  BP: 119/90 119/80 108/74 106/73  Pulse: 79 84 83 94  Resp: _0 Temp: 99 F (37.2 C) 98.3 F (36.8 C) 98.2 F (36.8 C) 98.2 F (36.8 C)  TempSrc: Oral  Oral Oral  SpO2: 95% 96% 97% 96%  Weight:      Height:       No  intake or output data in the 24 hours ending 08/19/20 1615 Filed Weights   08/12/20 2053  Weight: 80.3 kg    Examination:  General exam: Appears calm and comfortable  Respiratory system: Clear to auscultation. Respiratory effort normal. Cardiovascular system: S1 & S2 heard, Regular Gastrointestinal system: Abdomen is nondistended, soft and nontender. No organomegaly or masses felt. Normal bowel sounds heard. Central nervous system: Alert and oriented. No focal neurological deficits. Extremities: Symmetric 5 x 5 power. Skin: No rashes, lesions Psychiatry: Judgement and insight appear normal. Mood & affect appropriate.   Data Reviewed: I have personally reviewed following labs and imaging studies  CBC: Recent Labs  Lab 08/15/20 0245 08/18/20 0105  WBC 14.5* 11.9*  NEUTROABS 12.0*  --   HGB 14.0 14.0  HCT 43.0 41.8  MCV 84.8 82.0  PLT 352 401   Basic Metabolic Panel: Recent Labs  Lab 08/15/20 0245 08/18/20 0105 08/19/20 0359  NA 130* 124* 125*  K 3.5 4.0 3.6  CL 98 92* 94*  CO2 _1 GLUCOSE 136* 134* 126*  BUN _2 CREATININE 1.08 0.92 0.94  CALCIUM 8.7* 8.4* 8.5*  MG  --  2.0  --    GFR: Estimated Creatinine Clearance: 78.5 mL/min (by C-G formula based on SCr of 0.94 mg/dL). Liver Function Tests: Recent Labs  Lab 08/15/20 0245  AST 16  ALT 17  ALKPHOS 67  BILITOT 1.1  PROT 7.4  ALBUMIN 3.1*   No results for input(s): LIPASE, AMYLASE in the last 168 hours. No results for input(s): AMMONIA in the last 168 hours. Coagulation Profile: No results for input(s): INR, PROTIME in the last 168 hours. Cardiac Enzymes: No results for input(s): CKTOTAL, CKMB, CKMBINDEX, TROPONINI in the last 168 hours. BNP (last 3 results) No results for input(s): PROBNP in the last 8760 hours. HbA1C: No results for input(s): HGBA1C in the last 72 hours. CBG: Recent Labs  Lab 08/18/20 1207 08/18/20 1548 08/18/20 2119 08/19/20 0644 08/19/20 1152  GLUCAP 123*  120* 128* 125* 125*   Lipid Profile: No results for input(s): CHOL, HDL, LDLCALC, TRIG, CHOLHDL, LDLDIRECT in the last 72 hours. Thyroid Function Tests: No results for input(s): TSH, T4TOTAL, FREET4, T3FREE, THYROIDAB in the last 72 hours. Anemia Panel: No results for input(s): VITAMINB12, FOLATE, FERRITIN, TIBC, IRON, RETICCTPCT in the last 72 hours. Sepsis Labs: No results for input(s): PROCALCITON, LATICACIDVEN in the last 168 hours.  Recent Results (from the past 240 hour(s))  SARS CORONAVIRUS 2 (TAT 6-24 HRS) Nasopharyngeal Nasopharyngeal Swab     Status: None   Collection Time: 08/11/20  2:31 AM   Specimen: Nasopharyngeal Swab  Result Value Ref Range Status   SARS Coronavirus 2 NEGATIVE NEGATIVE Final    Comment: (NOTE) SARS-CoV-2 target nucleic acids are NOT DETECTED.  The SARS-CoV-2 RNA is generally detectable in upper and lower respiratory specimens during the acute phase of infection. Negative results do not preclude SARS-CoV-2 infection, do not rule out  co-infections with other pathogens, and should not be used as the sole basis for treatment or other patient management decisions. Negative results must be combined with clinical observations, patient history, and epidemiological information. The expected result is Negative.  Fact Sheet for Patients: SugarRoll.be  Fact Sheet for Healthcare Providers: https://www.woods-mathews.com/  This test is not yet approved or cleared by the [place] FDA and  has been authorized for detection and/or diagnosis of SARS-CoV-2 by FDA under an Emergency Use Authorization (EUA). This EUA will remain  in effect (meaning this test can be used) for the duration of the COVID-19 declaration under Se ction 564(b)(1) of the Act, 21 U.S.C. section 360bbb-3(b)(1), unless the authorization is terminated or revoked sooner.  Performed at [facility], [place] [address], [place],  [place] [address]   SARS Coronavirus 2 by RT PCR (hospital order, performed in [facility] hospital lab) Nasopharyngeal Nasopharyngeal Swab     Status: None   Collection Time: 08/13/20  1:50 AM   Specimen: Nasopharyngeal Swab  Result Value Ref Range Status   SARS Coronavirus 2 NEGATIVE NEGATIVE Final    Comment: (NOTE) SARS-CoV-2 target nucleic acids are NOT DETECTED.  The SARS-CoV-2 RNA is generally detectable in upper and lower respiratory specimens during the acute phase of infection. The lowest concentration of SARS-CoV-2 viral copies this assay can detect is 250 copies / mL. A negative result does not preclude SARS-CoV-2 infection and should not be used as the sole basis for treatment or other patient management decisions.  A negative result may occur with improper specimen collection / handling, submission of specimen other than nasopharyngeal swab, presence of viral mutation(s) within the areas targeted by this assay, and inadequate number of viral copies (<250 copies / mL). A negative result must be combined with clinical observations, patient history, and epidemiological information.  Fact Sheet for Patients:   StrictlyIdeas.no  Fact Sheet for Healthcare Providers: BankingDealers.co.za  This test is not yet approved or  cleared by the [place] FDA and has been authorized for detection and/or diagnosis of SARS-CoV-2 by FDA under an Emergency Use Authorization (EUA).  This EUA will remain in effect (meaning this test can be used) for the duration of the COVID-19 declaration under Section 564(b)(1) of the Act, 21 U.S.C. section 360bbb-3(b)(1), unless the authorization is terminated or revoked sooner.  Performed at [facility], [address] [place], [place] [address]      Radiology Studies: No results found.  Scheduled Meds: . amLODipine  5 mg Oral Daily  . cyanocobalamin  1,000 mcg Subcutaneous Daily  .  [START ON 08/24/2020] cyanocobalamin  1,000 mcg Intramuscular Q7 days  . enoxaparin (LOVENOX) injection  40 mg Subcutaneous Q24H  . gabapentin  100 mg Oral TID  . heparin sodium (porcine)      . insulin aspart  0-6 Units Subcutaneous TID WC  . lidocaine      . rosuvastatin  5 mg Oral Daily   Continuous Infusions:   LOS: 5 days   Marylu Lund, MD [facility] Pager On Amion  If 7PM-7AM, please contact night-coverage 08/19/2020, 4:15 PM

## 2020-08-19 NOTE — Progress Notes (Signed)
Occupational Therapy Treatment Patient Details Name: Jacob Cameron MRN: [id number] DOB: 1956-03-07 Today's Date: 08/19/2020    History of present illness Pt is a 65 y.o. male presenting with R neck pain and radiculopathy down his R arm with bil leg pain, parasthesias, and weakness. Symptoms began in legs then arm then neck. MRI showing no acute change in head.   OT comments  Pt received in bed with his son present, pt agreeable to OT/PT session. Pt's son assisted with translating during session. Per son, pt understands about 24* of English, can speak a little and is able to read in [place]. Pt demonstrating progress toward established OT goals. Pt currently requires moderate assistance +2 for sit<>stand x3 for about 30-60seconds and stand-pivot transfers, requiring physical assistance from therapist to advance LLE while pivoting toward left. Pt requires moderate assistance for LB dressing and LB hygiene. Pt appeared to be unaware he was saturated in urine. Did not formally assess cognition this date. Pt will continue to benefit from skilled OT services to maximize safety and independence with ADL/IADL and functional mobility. Will continue to follow acutely and progress as tolerated.    Follow Up Recommendations  CIR    Equipment Recommendations  3 in 1 bedside commode    Recommendations for Other Services PT consult    Precautions / Restrictions Precautions Precautions: Fall Precaution Comments: Translator helpful Restrictions Weight Bearing Restrictions: No       Mobility Bed Mobility Overal bed mobility: Needs Assistance Bed Mobility: Supine to Sit     Supine to sit: Min assist Sit to supine: HOB elevated;Supervision   General bed mobility comments: supervision as pt progressed to EOB with HOB elevated, cues for sequencing  Transfers Overall transfer level: Needs assistance Equipment used: Rolling walker (2 wheeled) Transfers: Sit to/from [employer] Sit to  Stand: Mod assist;+2 safety/equipment Stand pivot transfers: Mod assist;+2 physical assistance       General transfer comment: modA to powerup into standing x3 with minA for standing on second trial. with 3rd trial in standing, pt with bilateral knees buckling, required return to sitting. Pt with decreased advancement of LLE with pivot toward left, required assist from therapist to advance LLE. cues to shift weight and for sequencing steps    Balance Overall balance assessment: Needs assistance Sitting-balance support: Feet supported;No upper extremity supported Sitting balance-Leahy Scale: Fair Sitting balance - Comments: able to maintain static sitting without UE support Postural control: Other (comment) (anterior lean in standing) Standing balance support: Bilateral upper extremity supported;During functional activity;Single extremity supported Standing balance-Leahy Scale: Poor Standing balance comment: Mod-Max A for maintaining standing balance. Use or Rw for UE support. pt with forward flexed posture, max cues for upright posture                           ADL either performed or assessed with clinical judgement   ADL Overall ADL's : Needs assistance/impaired Eating/Feeding: Set up;Sitting Eating/Feeding Details (indicate cue type and reason): provided pt with built up handles Grooming: Set up;Sitting   Upper Body Bathing: Minimal assistance;Sitting   Lower Body Bathing: Maximal assistance;Sit to/from stand   Upper Body Dressing : Minimal assistance;Sitting   Lower Body Dressing: Moderate assistance;Sit to/from stand Lower Body Dressing Details (indicate cue type and reason): pt able to don pants over Bilat feet, assist for stability in standing to pull over hips and fasten Toilet Transfer: RW;+2 for physical assistance;+2 for safety/equipment;Moderate assistance Toilet Transfer Details (indicate  cue type and reason): simulated via stand-pivot from EOB to  recliner Toileting- Clothing Manipulation and Hygiene: Moderate assistance;+2 for physical assistance;+2 for safety/equipment Toileting - Clothing Manipulation Details (indicate cue type and reason): modA+2 for stability while standing as pt completed pericare     Functional mobility during ADLs: Rolling walker;Moderate assistance;+2 for physical assistance;+2 for safety/equipment General ADL Comments: pt limited by BLE weakness, bilateral knees buckling at times,     Vision       Perception     Praxis      Cognition Arousal/Alertness: Awake/alert Behavior During Therapy: WFL for tasks assessed/performed Overall Cognitive Status: Within Functional Limits for tasks assessed                                 General Comments: appeared to be unaware he was saturated in urine        Exercises Exercises: Other exercises General Exercises - Lower Extremity Ankle Circles/Pumps: AROM;Both;10 reps;Supine Long Arc Quad: AROM;Both;5 reps;Seated (with light manualk resistance) Heel Slides: AROM;Both;10 reps;Supine Straight Leg Raises: AROM;Both;10 reps;Supine Other Exercises Other Exercises: educated pt on use of fine motor coordination activities/exercises HEP with provided handouts and soft theraputty and stress ball. Other Exercises: provided pt with red tubing for feeding   Shoulder Instructions       General Comments vss;pt's son present during session and assisting with translation. Per son, pt understands 68* of English and can speak a little bit.. Son is fluent    Pertinent Vitals/ Pain       Pain Assessment: 0-10 Pain Score: 5  Pain Location: bilateral feet Pain Descriptors / Indicators: Sore;Constant Pain Intervention(s): Limited activity within patient's tolerance;Monitored during session  Home Living                                          Prior Functioning/Environment              Frequency  Min 2X/week        Progress  Toward Goals  OT Goals(current goals can now be found in the care plan section)  Progress towards OT goals: Progressing toward goals  Acute Rehab OT Goals Patient Stated Goal: to improve and eventually go home OT Goal Formulation: With patient Time For Goal Achievement: 08/28/20 Potential to Achieve Goals: Good ADL Goals Pt Will Perform Grooming: with set-up;with supervision;standing Pt Will Perform Lower Body Dressing: sit to/from stand;with min guard assist Pt Will Transfer to Toilet: with min guard assist;ambulating;bedside commode Pt Will Perform Toileting - Clothing Manipulation and hygiene: with min guard assist;sit to/from stand;sitting/lateral leans  Plan Discharge plan remains appropriate    Co-evaluation    PT/OT/SLP Co-Evaluation/Treatment: Yes Reason for Co-Treatment: Complexity of the patient's impairments (multi-system involvement);For patient/therapist safety;To address functional/ADL transfers   OT goals addressed during session: ADL's and self-care      AM-PAC OT "6 Clicks" Daily Activity     Outcome Measure   Help from another person eating meals?: A Little Help from another person taking care of personal grooming?: A Little Help from another person toileting, which includes using toliet, bedpan, or urinal?: A Lot Help from another person bathing (including washing, rinsing, drying)?: A Lot Help from another person to put on and taking off regular upper body clothing?: A Little Help from another person to put on and taking  off regular lower body clothing?: A Lot 6 Click Score: 15    End of Session Equipment Utilized During Treatment: Gait belt;Rolling walker  OT Visit Diagnosis: Unsteadiness on feet (R26.81);Other abnormalities of gait and mobility (R26.89);Muscle weakness (generalized) (M62.81);Pain Pain - Right/Left:  (bilateral) Pain - part of body:  (feet)   Activity Tolerance Patient tolerated treatment well;Patient limited by fatigue   Patient  Left with call bell/phone within reach;in chair;with chair alarm set   Nurse Communication Mobility status        Time: 6060-0459 OT Time Calculation (min): 24 min  Charges: OT General Charges $OT Visit: 1 Visit OT Treatments $Self Care/Home Management : 8-22 mins  Rosey Bath OTR/L Acute Rehabilitation Services Office: [phone]    Rebeca Alert 08/19/2020, 1:41 PM

## 2020-08-19 NOTE — Plan of Care (Signed)

## 2020-08-19 NOTE — Procedures (Signed)
Interventional Radiology Procedure Note  Procedure: Non-tunneled dialysis catheter  Indication: Plasmapheresis for GBS  Findings: Please refer to procedural dictation for full description.  Complications: None  EBL: < 10 mL  Acquanetta Belling, MD [phone]

## 2020-08-19 NOTE — Consult Note (Signed)
   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 Plan for image-guided non-tunneled HD catheter placement in IR tentatively for today pending IR scheduling. Afebrile.  Tele-interpretor [phone] was used throughout today's interaction.  Risks and benefits discussed with the patient including, but not limited to bleeding, infection, vascular injury, pneumothorax which may require chest tube placement, air embolism or even death. All of the patient's questions were answered, patient is agreeable to proceed. Consent signed and in IR control room.  ______________________________________________________________________   History of Present Illness: Jacob Cameron is a 65 y.o. male with an unremarkable past medical history who has been admitted to [facility] since 08/12/2020 for management of paresthesias of right hand and feet (distal polyneuropathy). 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 He was started on IVIG with some improvement of symptoms, however symptoms still disabling. Neurology recommending plasma exchange for further management.  IR consulted by Dr. Rhona Leavens for possible image-guided non-tunneled HD catheter placement. Patient awake and alert sitting in chair. States he is tired today. Son at bedside. Denies chest pain, dyspnea, abdominal pain, or headache.   Allergies and medications reviewed.   Review of Systems: A 12 point ROS discussed and pertinent positives are indicated in the HPI above.  All other systems are negative.  Review of Systems  Constitutional: Positive for fatigue. Negative for fever.  Respiratory: Negative for shortness of breath.   Cardiovascular: Negative for chest pain.  Gastrointestinal: Negative for  abdominal pain.  Neurological: Negative for headaches.  Psychiatric/Behavioral: Negative for behavioral problems and confusion.    Vital Signs: BP 106/73 (BP Location: Right Arm)   Pulse 94   Temp 98.2 F (36.8 C) (Oral)   Resp 17   Ht 5\' 7"  (1.702 m)   Wt 177 lb (80.3 kg)   SpO2 96%   BMI 27.72 kg/m   Physical Exam Vitals and nursing note reviewed.  Constitutional:      General: He is not in acute distress.    Appearance: Normal appearance.  Cardiovascular:     Rate and Rhythm: Normal rate and regular rhythm.     Heart sounds: Normal heart sounds. No murmur heard.   Pulmonary:     Effort: Pulmonary effort is normal. No respiratory distress.     Breath sounds: Normal breath sounds. No wheezing.  Skin:    General: Skin is warm and dry.  Neurological:     Mental Status: He is alert and oriented to person, place, and time.      Imaging reviewed.   Labs:  COAGS: No results for input(s): INR, APTT in the last 8760 hours.  BMP: Recent Labs    08/10/20 2120 08/15/20 0245 08/18/20 0105 08/19/20 0359  NA 134* 130* 124* 125*  K 3.8 3.5 4.0 3.6  CL 99 98 92* 94*  CO2 23 22 22 22   GLUCOSE 113* 136* 134* 126*  BUN 20 21 17 19   CALCIUM 9.5 8.7* 8.4* 8.5*  CREATININE 1.14 1.08 0.92 0.94  GFRNONAA >60 >60 >60 >60       Electronically Signed: 10/17/20, PA-C 08/19/2020, 2:24 PM   I spent a total of 15 minutes in face to face in clinical consultation, greater than 50% of which was counseling/coordinating care for venous access.

## 2020-08-19 NOTE — Progress Notes (Signed)
Neurology Progress Note  S: Son in room helps as interpreter. Patient states his right arm numbness has improved and is now located mostly in the right hand and up to elbow, but not above elbow now. Patient feels he has gotten weaker in LEs, especially in RLE.  He is still unable to ambulate. Per RN, the central line has not yet been done because on her rounds this am, the patient and son changed their mind and did not want plasma exchange. However, after NP discussed treatment, son and patient agreed to procedure. In review of PT note, patient is now moderate assist x 2 to stand with walker and pivot to chair. He was max assist x 2 on NP's last review of PT notes. IVIG day 5/5 completed yesterday.   O: Current vital signs: BP 108/74 (BP Location: Right Arm)   Pulse 83   Temp 98.2 F (36.8 C) (Oral)   Resp 16   Ht 5\' 7"  (1.702 m)   Wt 80.3 kg   SpO2 97%   BMI 27.72 kg/m  Vital signs in last 24 hours: Temp:  [97.9 F (36.6 C)-99 F (37.2 C)] 98.2 F (36.8 C) (02/09 0827) Pulse Rate:  [79-88] 83 (02/09 0827) Resp:  [15-20] 16 (02/09 0827) BP: (89-119)/(68-90) 108/74 (02/09 0827) SpO2:  [95 %-100 %] 97 % (02/09 0827)  GENERAL: Awake in NAD PSYCH: sleepy after pain med but easily aroused. Affect appropriate to situation.  HEENT: Normocephalic and atraumatic, dry mm LUNGS: Normal respiratory effort.  CV: RRR to radial pulse.  ABDOMEN: Soft, nontender Ext: warm  NEURO:  Mental Status: AA&Ox3  Speech/Language: speech is fluent.  Naming, repetition, fluency, and comprehension intact. Cranial Nerves:  II: PERRL. Visual fields full.  III, IV, VI: EOMI. Eyelids elevate symmetrically.  V: Sensation is intact to light touch and symmetrical to face.  VII: Smile is symmetrical. Able to puff cheeks and raise eyebrows.  VIII: hearing intact to voice. IX, X: Palate elevates symmetrically. Phonation is normal.  XI: Shoulder shrug 5/5. XII: tongue is midline without fasciculations. Motor:  RUE 4/5 biceps, 4-/5 triceps, grip 4/5. LUE 5/5 throughout. RLE 4-/5 dorsal and plantar flexion. Abduction/adduction RLE 4-/5. Knee RLE 4+/5. LLE 5/5 abductor/adductor. 5/5 knee. 4+/5 plantar flexion and 4/5 dorsiflexion.  Tone: is normal and bulk is normal Sensation- Intact to light touch bilaterally in all extremities. RUE is decreased to elbow, but worse in stocking/glove presentation. LUE intact. RLE decreased above knee. Temperature is decreased foot to mid shin. LLE same as RLE.    Coordination: FTN and HKS intact bilaterally, No drift.  DTRs: RUE 1+ biceps and brachioradialis. 0 triceps. LUE 1+ throughout. RLE 0. LLE 0.  Plantars: Toes down going bilaterally  Gait- Deferred  Medications  Current Facility-Administered Medications:  .  acetaminophen (TYLENOL) tablet 650 mg, 650 mg, Oral, Q4H PRN, 650 mg at 08/17/20 0529 **OR** acetaminophen (TYLENOL) 160 MG/5ML solution 650 mg, 650 mg, Per Tube, Q4H PRN **OR** acetaminophen (TYLENOL) suppository 650 mg, 650 mg, Rectal, Q4H PRN, 10/15/20, MD .  amLODipine (NORVASC) tablet 5 mg, 5 mg, Oral, Daily, Jonah Blue, Pratik D, DO, 5 mg at 08/18/20 1006 .  cyanocobalamin ((VITAMIN B-12)) injection 1,000 mcg, 1,000 mcg, Subcutaneous, Daily, 10/16/20, MD, 1,000 mcg at 08/18/20 1006 .  [START ON 08/24/2020] cyanocobalamin ((VITAMIN B-12)) injection 1,000 mcg, 1,000 mcg, Intramuscular, Q7 days, 08/26/2020, MD .  enoxaparin (LOVENOX) injection 40 mg, 40 mg, Subcutaneous, Q24H, Caryl Pina, MD, 40 mg at 08/18/20 1637 .  gabapentin (NEURONTIN) capsule 100 mg, 100 mg, Oral, TID, Tyrone Nine, MD, 100 mg at 08/18/20 2154 .  hydrALAZINE (APRESOLINE) injection 5 mg, 5 mg, Intravenous, Q4H PRN, John Giovanni, MD .  HYDROmorphone (DILAUDID) injection 1 mg, 1 mg, Intravenous, Q3H PRN, Sherryll Burger, Pratik D, DO .  insulin aspart (novoLOG) injection 0-6 Units, 0-6 Units, Subcutaneous, TID WC, Tyrone Nine, MD, 1 Units at 08/15/20 1745 .  ondansetron  [facility]) injection 4 mg, 4 mg, Intravenous, Q6H PRN, Jonah Blue, MD, 4 mg at 08/14/20 2146 .  oxyCODONE (Oxy IR/ROXICODONE) immediate release tablet 5 mg, 5 mg, Oral, Q4H PRN, Tyrone Nine, MD, 5 mg at 08/18/20 1012 .  rosuvastatin (CRESTOR) tablet 5 mg, 5 mg, Oral, Daily, Hazeline Junker B, MD, 5 mg at 08/18/20 1006 .  senna-docusate (Senokot-S) tablet 1 tablet, 1 tablet, Oral, QHS PRN, Jonah Blue, MD, 1 tablet at 08/17/20 0529  Pertinent Labs Na  125 08/19/20.  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  - Symptoms/signs includeworsening progressive weakness since NPs last exam on 08/16/20. BLE areflexia, upper extremity asymmetric hyporeflexia and sensory loss in multiple modalities exhibiting a stocking glove distribution, but sparing the left arm.  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  - Hyponatremia noted on lab work with Na at 125 (was 124). 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 Patient euvolemic on fluid restriction today.  - There has beensome improvement with IVIG over the course of treatment; however, symptoms remain disabling .  - Long discussion again today with patient and son about the risks and benefits of plasma exchange therapy. Also, discussed the fact that patient's recovery and rehabilitation time may last for months, and that is why he is receiving PT and will need CIR which has been recommended by PT. Son stated patient has not received PT in days, but chart reviewed and patient last received PT yesterday; the son was informed. NP explained that the central line should be placed today and PLEX will begin, hopefully today.  -At this time, patient refusing LP for further supporting  diagnosticinformation regarding probableGBS.  -MRI brain without acute intracranial abnormality. MRI of cervical, thoracic and lumbar spine without evidence for a myelopathy.   Recommendations: 1. Placement of central line today and first PLEX. 2. Continue B12 supplementation.  3. Continue to correct metabolic derangements as you are doing.  4. Continue to educate the patient regarding GBS, which is the most likely etiology for his presentation. 5. Agree with CIR.  Pt seen by Jimmye Norman, MSN, APN-BC/Nurse Practitioner/Neuro    Pager: [phone]  Electronically signed: Dr. Caryl Pina

## 2020-08-20 DIAGNOSIS — G610 Guillain-Barre syndrome: Secondary | ICD-10-CM | POA: Diagnosis not present

## 2020-08-20 DIAGNOSIS — M792 Neuralgia and neuritis, unspecified: Secondary | ICD-10-CM | POA: Diagnosis not present

## 2020-08-20 DIAGNOSIS — R202 Paresthesia of skin: Secondary | ICD-10-CM | POA: Diagnosis not present

## 2020-08-20 LAB — COMPREHENSIVE METABOLIC PANEL
ALT: 64 U/L — ABNORMAL HIGH (ref 0–44)
AST: 45 U/L — ABNORMAL HIGH (ref 15–41)
Albumin: 2.3 g/dL — ABNORMAL LOW (ref 3.5–5.0)
Alkaline Phosphatase: 69 U/L (ref 38–126)
Anion gap: 9 (ref 5–15)
BUN: 24 mg/dL — ABNORMAL HIGH (ref 8–23)
CO2: 22 mmol/L (ref 22–32)
Calcium: 8.7 mg/dL — ABNORMAL LOW (ref 8.9–10.3)
Chloride: 95 mmol/L — ABNORMAL LOW (ref 98–111)
Creatinine, Ser: 0.99 mg/dL (ref 0.61–1.24)
GFR, Estimated: >60 mL/min (ref >60)
Glucose, Bld: 128 mg/dL — ABNORMAL HIGH (ref 70–99)
Potassium: 3.7 mmol/L (ref 3.5–5.1)
Sodium: 126 mmol/L — ABNORMAL LOW (ref 135–145)
Total Bilirubin: 0.7 mg/dL (ref 0.3–1.2)
Total Protein: 7.6 g/dL (ref 6.5–8.1)

## 2020-08-20 LAB — BASIC METABOLIC PANEL
Anion gap: 9 (ref 5–15)
BUN: 21 mg/dL (ref 8–23)
CO2: 23 mmol/L (ref 22–32)
Calcium: 8.6 mg/dL — ABNORMAL LOW (ref 8.9–10.3)
Chloride: 95 mmol/L — ABNORMAL LOW (ref 98–111)
Creatinine, Ser: 1.1 mg/dL (ref 0.61–1.24)
GFR, Estimated: >60 mL/min (ref >60)
Glucose, Bld: 132 mg/dL — ABNORMAL HIGH (ref 70–99)
Potassium: 4 mmol/L (ref 3.5–5.1)
Sodium: 127 mmol/L — ABNORMAL LOW (ref 135–145)

## 2020-08-20 LAB — GLUCOSE, CAPILLARY
Glucose-Capillary: 116 mg/dL — ABNORMAL HIGH (ref 70–99)
Glucose-Capillary: 131 mg/dL — ABNORMAL HIGH (ref 70–99)
Glucose-Capillary: 197 mg/dL — ABNORMAL HIGH (ref 70–99)
Glucose-Capillary: 106 mg/dL — ABNORMAL HIGH (ref 70–99)
Glucose-Capillary: 111 mg/dL — ABNORMAL HIGH (ref 70–99)

## 2020-08-20 LAB — CBC
HCT: 38.8 % — ABNORMAL LOW (ref 39.0–52.0)
Hemoglobin: 13.4 g/dL (ref 13.0–17.0)
MCH: 28.2 pg (ref 26.0–34.0)
MCHC: 34.5 g/dL (ref 30.0–36.0)
MCV: 81.7 fL (ref 80.0–100.0)
Platelets: 346 10*3/uL (ref 150–400)
RBC: 4.75 MIL/uL (ref 4.22–5.81)
RDW: 12.7 % (ref 11.5–15.5)
WBC: 10.4 10*3/uL (ref 4.0–10.5)
nRBC: 0 % (ref 0.0–0.2)

## 2020-08-20 MED ORDER — ZOLPIDEM TARTRATE 5 MG PO TABS: 5.0000 mg | ORAL_TABLET | Freq: Every evening | ORAL | Status: DC | PRN

## 2020-08-20 NOTE — Progress Notes (Signed)
PROGRESS NOTE    Jacob Cameron  ZJQ:[id number] DOB: March 10, 1956 DOA: 08/12/2020 PCP: Patient, No Pcp Per    Brief Narrative:  65 y.o.malewithout medical history, s/p J&J covid vaccine April 2021 who presented with paresthesias in the right hand and feet with associated pain that has progressed over the previous few days now causing him to be unable to walk. MRI brain was nonacute, so he was admitted to [facility] for neurology evaluation on the advice of teleneurology with MRI C/T/L spine pending. Neurology has initiated IVIG empirically. Symptoms are stable and severely disabling. PT and OT recommend CIR at this time.He has undergone MRI on 2/6 with no acute findings. Refusing LP.  -He will finish his IVIG on 2/8 and then will need placement.  Also noted to be hyponatremic on 2/8 for which IV fluid and fluid restriction started with lab work ordered and pending.  Ongoing need for pain management.  Assessment & Plan:   Principal Problem:   Paresthesias Active Problems:   Tobacco abuse   Polyneuropathy, idiopathic progressive   Distal polyneuropathy: Unclear etiology. Vitamin B12 level wnl at 187. CK wnl. ESR 20, CRP 1.6.  - Neurology directing management:Completed IVIGx5 daysfor empiric Tx(end 2/8) - B12 supplementation due to low-normal level. - Pt reports he's willing to try MRI. D/w RN, neuro, and reordered prn ativan to assist with anxiety/claustrophobia.  - Decision Re: LPrefused -Continuegabapentin low dose. - Therapy recommending CIR. Pt is currently severely debilitated by symptoms and would not be able to return home safely. PM&R consulted. 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 -Sodium is slightly improved this AM  T2DM: HbA1c 7%, new diagnosis. Given lack of significant symptoms, do not believe this is likely to have been present long enough or severe enough to precipitate acute polyneuropathy. - Diabetes coordinator and dietitian consulted - Start metformin after we know contrasted exams will no longer be neededand after GI symptoms have resolved.  - continued on SSI coverage as needed  Neutrophilic leukocytosis: Afebrile. No meningismus on exam.WBC coming down. - Continue monitoring off antimicrobial therapy.Remains afebrile.ESR, CRP not indicative of severe inflammatory state.  Hypertension -Started amlodipine 5mg  daily on 2/7, cont to titrate bp meds as needed  Dyslipidemia: LDL 192. LFTs wnl.  - Continued onrosuvastatin  Tobacco use:  - Cessation counseling  Elevated TSH: Mildly elevated at 5.311. T3, T4 wnl.  - Would repeat TSH in 4-6 weeks  DVT prophylaxis: Lovenox subq Code Status: Full Family Communication: Pt in room, family at bedside  Status is: Inpatient  Remains inpatient appropriate because:Unsafe d/c plan and IV treatments appropriate due to intensity of illness or inability to take PO   Dispo: The patient is from: Home              Anticipated d/c is to: CIR              Anticipated d/c date is: 3 days              Patient currently is not medically stable to d/c.   Difficult to place patient No    Consultants:   Neurology  IR  Procedures:  Antimicrobials: Anti-infectives (From admission, onward)   None      Subjective: States feeling slightly better today  Objective: Vitals:   08/20/20 0304 08/20/20 0739 08/20/20 1113 08/20/20 1549  BP: 97/72 112/82 100/70 112/78  Pulse: 83 81 85 83  Resp: $Remo'17 18 18 18  'UzcBY$ Temp: 98 F (36.7 C) 98 F (36.7 C) 98.2 F (36.8 C) 98 F (36.7 C)  TempSrc:   Oral   SpO2: 95% 96% 98% 95%  Weight:       Height:        Intake/Output Summary (Last 24 hours) at 08/20/2020 1725 Last data filed at 08/20/2020 1330 Gross per 24 hour  Intake 480 ml  Output 1000 ml  Net -520 ml   Filed Weights   08/12/20 2053  Weight: 80.3 kg    Examination: General exam: Awake, laying in bed, in nad Respiratory system: Normal respiratory effort, no wheezing Cardiovascular system: regular rate, s1, s2 Gastrointestinal system: Soft, nondistended, positive BS Central nervous system: CN2-12 grossly intact, strength intact Extremities: Perfused, no clubbing Skin: Normal skin turgor, no notable skin lesions seen Psychiatry: Mood normal // no visual hallucinations   Data Reviewed: I have personally reviewed following labs and imaging studies  CBC: Recent Labs  Lab 08/15/20 0245 08/18/20 0105 08/20/20 0449  WBC 14.5* 11.9* 10.4  NEUTROABS 12.0*  --   --   HGB 14.0 14.0 13.4  HCT 43.0 41.8 38.8*  MCV 84.8 82.0 81.7  PLT 352 362 456   Basic Metabolic Panel: Recent Labs  Lab 08/15/20 0245 08/18/20 0105 08/19/20 0359 08/20/20 0449  NA 130* 124* 125* 126*  K 3.5 4.0 3.6 3.7  CL 98 92* 94* 95*  CO2 $Re'22 22 22 22  'pDn$ GLUCOSE 136* 134* 126* 128*  BUN $Re'21 17 19 'LJJ$ 24*  CREATININE 1.08 0.92 0.94 0.99  CALCIUM 8.7* 8.4* 8.5* 8.7*  MG  --  2.0  --   --    GFR: Estimated Creatinine Clearance: 74.5 mL/min (by C-G formula based on SCr of 0.99 mg/dL). Liver Function Tests: Recent Labs  Lab 08/15/20 0245 08/20/20 0449  AST 16 45*  ALT 17 64*  ALKPHOS 67 69  BILITOT 1.1 0.7  PROT 7.4 7.6  ALBUMIN 3.1* 2.3*   No results for input(s): LIPASE, AMYLASE in the last 168 hours. No results for input(s): AMMONIA in the last 168 hours. Coagulation Profile: No results for input(s): INR, PROTIME in the last 168 hours. Cardiac Enzymes: No results for input(s): CKTOTAL, CKMB, CKMBINDEX, TROPONINI in the last 168 hours. BNP (last 3 results) No results for input(s): PROBNP in the last 8760 hours. HbA1C: No  results for input(s): HGBA1C in the last 72 hours. CBG: Recent Labs  Lab 08/19/20 2110 08/20/20 0603 08/20/20 0814 08/20/20 1117 08/20/20 1546  GLUCAP 157* 116* 131* 197* 106*   Lipid Profile: No results for input(s): CHOL, HDL, LDLCALC, TRIG, CHOLHDL, LDLDIRECT in the last 72 hours. Thyroid Function Tests: No results for input(s): TSH, T4TOTAL, FREET4, T3FREE, THYROIDAB in the last 72 hours. Anemia Panel: No results for input(s): VITAMINB12, FOLATE, FERRITIN, TIBC, IRON, RETICCTPCT in the last 72 hours. Sepsis Labs: No results for input(s): PROCALCITON, LATICACIDVEN in the last 168 hours.  Recent Results (from the past 240 hour(s))  SARS CORONAVIRUS 2 (TAT 6-24 HRS) Nasopharyngeal Nasopharyngeal Swab     Status: None   Collection Time: 08/11/20  2:31 AM   Specimen: Nasopharyngeal Swab  Result Value Ref Range Status   SARS Coronavirus 2 NEGATIVE NEGATIVE  Final    Comment: (NOTE) SARS-CoV-2 target nucleic acids are NOT DETECTED.  The SARS-CoV-2 RNA is generally detectable in upper and lower respiratory specimens during the acute phase of infection. Negative results do not preclude SARS-CoV-2 infection, do not rule out co-infections with other pathogens, and should not be used as the sole basis for treatment or other patient management decisions. Negative results must be combined with clinical observations, patient history, and epidemiological information. The expected result is Negative.  Fact Sheet for Patients: HairSlick.no  Fact Sheet for Healthcare Providers: quierodirigir.com  This test is not yet approved or cleared by the [place] FDA and  has been authorized for detection and/or diagnosis of SARS-CoV-2 by FDA under an Emergency Use Authorization (EUA). This EUA will remain  in effect (meaning this test can be used) for the duration of the COVID-19 declaration under Se ction 564(b)(1) of the Act, 21  U.S.C. section 360bbb-3(b)(1), unless the authorization is terminated or revoked sooner.  Performed at [facility], [address] [place], [place] [address]   SARS Coronavirus 2 by RT PCR (hospital order, performed in [facility] hospital lab) Nasopharyngeal Nasopharyngeal Swab     Status: None   Collection Time: 08/13/20  1:50 AM   Specimen: Nasopharyngeal Swab  Result Value Ref Range Status   SARS Coronavirus 2 NEGATIVE NEGATIVE Final    Comment: (NOTE) SARS-CoV-2 target nucleic acids are NOT DETECTED.  The SARS-CoV-2 RNA is generally detectable in upper and lower respiratory specimens during the acute phase of infection. The lowest concentration of SARS-CoV-2 viral copies this assay can detect is 250 copies / mL. A negative result does not preclude SARS-CoV-2 infection and should not be used as the sole basis for treatment or other patient management decisions.  A negative result may occur with improper specimen collection / handling, submission of specimen other than nasopharyngeal swab, presence of viral mutation(s) within the areas targeted by this assay, and inadequate number of viral copies (<250 copies / mL). A negative result must be combined with clinical observations, patient history, and epidemiological information.  Fact Sheet for Patients:   BoilerBrush.com.cy  Fact Sheet for Healthcare Providers: https://pope.com/  This test is not yet approved or  cleared by the [place] FDA and has been authorized for detection and/or diagnosis of SARS-CoV-2 by FDA under an Emergency Use Authorization (EUA).  This EUA will remain in effect (meaning this test can be used) for the duration of the COVID-19 declaration under Section 564(b)(1) of the Act, 21 U.S.C. section 360bbb-3(b)(1), unless the authorization is terminated or revoked sooner.  Performed at [facility], [address], [place],  [place] [address]      Radiology Studies: IR Fluoro Guide CV Line Right  Result Date: 08/19/2020 INDICATION: Karlene Lineman syndrome. Central venous access required for plasma exchange. EXAM: Non-tunneled dialysis catheter placement MEDICATIONS: None ANESTHESIA/SEDATION: None FLUOROSCOPY TIME:  Fluoroscopy Time: 24 seconds (2 mGy). COMPLICATIONS: None immediate. PROCEDURE: Informed written consent was obtained from the patient after a thorough discussion of the procedural risks, benefits and alternatives. All questions were addressed. Maximal Sterile Barrier Technique was utilized including caps, mask, sterile gowns, sterile gloves, sterile drape, hand hygiene and skin antiseptic. A timeout was performed prior to the initiation of the procedure. Patient positioned supine on the angiography table. Right neck prepped and draped in the usual sterile fashion. All elements of maximal sterile barrier were utilized including, cap, mask, sterile gown, sterile gloves, large sterile drape, hand  scrubbing and 2% Chlorhexidine for skin cleaning. The right internal jugular vein was evaluated with ultrasound and shown to be patent. A permanent ultrasound image was obtained and placed in the patient's medical record. Using sterile gel and a sterile probe cover, the right internal jugular vein was entered with a 21 ga needle during real time ultrasound guidance. 0.018 inch guidewire placed and 21 ga needle exchanged for transitional dilator set. Utilizing fluoroscopy, 0.035 inch guidewire advanced through the needle without difficulty. Serial dilation performed, and catheter inserted over the guidewire. The tip was positioned in the right atrium. All lumens of the catheter aspirated and flushed well. The dialysis lumens were locked with Heparin. The catheter was secured to the skin with suture. The insertion site was covered with a Biopatch and sterile dressing. IMPRESSION: Right IJ non tunneled hemodialysis catheter (16 cm) as above.  Electronically Signed   By: Miachel Roux M.D.   On: 08/19/2020 16:34   IR US Guide Vasc Access Right  Result Date: 08/19/2020 INDICATION: Guillian Barre syndrome. Central venous access required for plasma exchange. EXAM: Non-tunneled dialysis catheter placement MEDICATIONS: None ANESTHESIA/SEDATION: None FLUOROSCOPY TIME:  Fluoroscopy Time: 24 seconds (2 mGy). COMPLICATIONS: None immediate. PROCEDURE: Informed written consent was obtained from the patient after a thorough discussion of the procedural risks, benefits and alternatives. All questions were addressed. Maximal Sterile Barrier Technique was utilized including caps, mask, sterile gowns, sterile gloves, sterile drape, hand hygiene and skin antiseptic. A timeout was performed prior to the initiation of the procedure. Patient positioned supine on the angiography table. Right neck prepped and draped in the usual sterile fashion. All elements of maximal sterile barrier were utilized including, cap, mask, sterile gown, sterile gloves, large sterile drape, hand scrubbing and 2% Chlorhexidine for skin cleaning. The right internal jugular vein was evaluated with ultrasound and shown to be patent. A permanent ultrasound image was obtained and placed in the patient's medical record. Using sterile gel and a sterile probe cover, the right internal jugular vein was entered with a 21 ga needle during real time ultrasound guidance. 0.018 inch guidewire placed and 21 ga needle exchanged for transitional dilator set. Utilizing fluoroscopy, 0.035 inch guidewire advanced through the needle without difficulty. Serial dilation performed, and catheter inserted over the guidewire. The tip was positioned in the right atrium. All lumens of the catheter aspirated and flushed well. The dialysis lumens were locked with Heparin. The catheter was secured to the skin with suture. The insertion site was covered with a Biopatch and sterile dressing. IMPRESSION: Right IJ non tunneled  hemodialysis catheter (16 cm) as above. Electronically Signed   By: Miachel Roux M.D.   On: 08/19/2020 16:34    Scheduled Meds: . amLODipine  5 mg Oral Daily  . Chlorhexidine Gluconate Cloth  6 each Topical Daily  . cyanocobalamin  1,000 mcg Subcutaneous Daily  . [START ON 08/24/2020] cyanocobalamin  1,000 mcg Intramuscular Q7 days  . enoxaparin (LOVENOX) injection  40 mg Subcutaneous Q24H  . gabapentin  100 mg Oral TID  . insulin aspart  0-6 Units Subcutaneous TID WC  . rosuvastatin  5 mg Oral Daily   Continuous Infusions:   LOS: 6 days   Marylu Lund, MD [facility] Pager On Amion  If 7PM-7AM, please contact night-coverage 08/20/2020, 5:25 PM

## 2020-08-20 NOTE — Progress Notes (Addendum)
Neurology Progress Note  Subjective: No acute overnight events. Patient evaluated at bedside this morning, no family present in the room.  He denies any new complaints and states he feels better than yesterday.  Notes he is feeling sleepy this morning as was not able to have a good night sleep last night. Got his central line yesterday. Denies any difficulty breathing and still agreeable for plasma exchange today.  Objective: Current vital signs: BP 112/82 (BP Location: Right Arm)   Pulse 81   Temp 98 F (36.7 C)   Resp 18   Ht 5\' 7"  (1.702 m)   Wt 80.3 kg   SpO2 96%   BMI 27.72 kg/m  Vital signs in last 24 hours: Temp:  [98 F (36.7 C)-98.7 F (37.1 C)] 98 F (36.7 C) (02/10 0739) Pulse Rate:  [80-94] 81 (02/10 0739) Resp:  [16-18] 18 (02/10 0739) BP: (91-112)/(60-82) 112/82 (02/10 0739) SpO2:  [95 %-99 %] 96 % (02/10 0739)  Physical exam GENERAL: Awake, lying comfortably in bed,  in NAD PSYCH: Normal mood and affect. HEENT: Normocephalic and atraumatic, dry mm LUNGS: Normal respiratory effort.  CV: RRR to radial pulse.  ABDOMEN: Soft, nontender Ext: warm  NEURO:  Mental Status: Alert, awake, oriented to time place and person.Speech is fluent.  Naming, repetition, fluency, and comprehension intact. Cranial Nerves:  II: PERRL. Visual fields full.  III, IV, VI: EOMI. Eyelids elevate symmetrically.  V: Sensation is intact to light touch and symmetrical to face.  VII: Smile is symmetrical. Able to puff cheeks and raise eyebrows.  VIII: hearing intact to voice. IX, X: Palate elevates symmetrically. Phonation is normal.  XI: Shoulder shrug 5/5. XII: tongue is midline without fasciculations. Motor: RUE 4/5 biceps, 4-/5 triceps, grip 4/5. LUE 5/5 throughout. RLE 4-/5 dorsal and plantar flexion. Abduction/adduction RLE 4-/5. Knee RLE 4+/5. LLE 5/5 abductor/adductor. 5/5 knee. 4+/5 plantar flexion and 4/5 dorsiflexion.  Tone: is normal and bulk is normal Sensation- Intact to  light touch bilaterally in all extremities. RUE is decreased to elbow, but worse in stocking/glove presentation. LUE intact. RLE decreased above knee. Temperature is decreased foot to mid shin. LLE same as RLE.    Coordination: FTN and HKS intact bilaterally, No drift.  DTRs: RUE 1+ biceps and brachioradialis. 0 triceps. LUE 1+ throughout. RLE 0. LLE 0.  Plantars: Toes down going bilaterally  Gait- Deferred  Medications  Current Facility-Administered Medications:  .  acetaminophen (TYLENOL) tablet 650 mg, 650 mg, Oral, Q4H PRN, 650 mg at 08/17/20 0529 **OR** acetaminophen (TYLENOL) 160 MG/5ML solution 650 mg, 650 mg, Per Tube, Q4H PRN **OR** acetaminophen (TYLENOL) suppository 650 mg, 650 mg, Rectal, Q4H PRN, 10/15/20, MD .  amLODipine (NORVASC) tablet 5 mg, 5 mg, Oral, Daily, Jonah Blue, Pratik D, DO, 5 mg at 08/19/20 1047 .  Chlorhexidine Gluconate Cloth 2 % PADS 6 each, 6 each, Topical, Daily, 10/17/20, MD, 6 each at 08/19/20 1657 .  cyanocobalamin ((VITAMIN B-12)) injection 1,000 mcg, 1,000 mcg, Subcutaneous, Daily, 10/17/20, MD, 1,000 mcg at 08/19/20 1047 .  [START ON 08/24/2020] cyanocobalamin ((VITAMIN B-12)) injection 1,000 mcg, 1,000 mcg, Intramuscular, Q7 days, 08/26/2020, MD .  enoxaparin (LOVENOX) injection 40 mg, 40 mg, Subcutaneous, Q24H, Caryl Pina, MD, 40 mg at 08/19/20 1432 .  gabapentin (NEURONTIN) capsule 100 mg, 100 mg, Oral, TID, 10/17/20 B, MD, 100 mg at 08/19/20 2137 .  hydrALAZINE (APRESOLINE) injection 5 mg, 5 mg, Intravenous, Q4H PRN, 2138, MD .  HYDROmorphone (DILAUDID) injection  1 mg, 1 mg, Intravenous, Q3H PRN, Sherryll Burger, Pratik D, DO .  insulin aspart (novoLOG) injection 0-6 Units, 0-6 Units, Subcutaneous, TID WC, Tyrone Nine, MD, 1 Units at 08/15/20 1745 .  lidocaine (PF) (XYLOCAINE) 1 % injection, , , PRN, Mir, Al Corpus, MD, 5 mL at 08/19/20 1549 .  ondansetron (ZOFRAN) injection 4 mg, 4 mg, Intravenous, Q6H PRN, Jonah Blue,  MD, 4 mg at 08/19/20 1730 .  oxyCODONE (Oxy IR/ROXICODONE) immediate release tablet 5 mg, 5 mg, Oral, Q4H PRN, Tyrone Nine, MD, 5 mg at 08/19/20 2143 .  rosuvastatin (CRESTOR) tablet 5 mg, 5 mg, Oral, Daily, Hazeline Junker B, MD, 5 mg at 08/19/20 1047 .  senna-docusate (Senokot-S) tablet 1 tablet, 1 tablet, Oral, QHS PRN, Jonah Blue, MD, 1 tablet at 08/17/20 0529  Pertinent Labs Na  126  08/20/20.    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  -Symptoms/signs are similar as yesterday.  Diffuse weakness, BLE areflexia, upper extremity asymmetric hyporeflexia and sensory loss in multiple modalities exhibiting a stocking glove distribution (sensory loss sparing the left arm, however).  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  - Hyponatremia noted on lab work with Na at 126. 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 Patient euvolemic on fluid restriction today.  - There has beensome improvement with IVIG over the course of treatment; however, symptoms remain disabling .  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 - Negative inspiratory force (NIF), -30 with good effort. -Patient has refused LP for further supporting diagnosticinformation regarding probableGBS.  -MRI brain without acute intracranial abnormality. MRI of cervical, thoracic and lumbar spine without evidence for a myelopathy.   Recommendations:  -      Plasma exchange therapy, most likely first treatment today. Will continue QOD for a total of 5 treatments.  -      Continue B12 supplementation.  -      Continue to correct hyponatremia as per  primary team. -      Continue to educate the patient regarding GBS, which is the most likely etiology for his presentation. -      Agree with [facility] ([facility])  Arnoldo Lenis, MD PGY-1 Psychiatry Resident  Electronically signed: Dr. Caryl Pina

## 2020-08-20 NOTE — Progress Notes (Signed)
Physical Therapy Treatment Patient Details Name: Jacob Cameron MRN: [id number] DOB: [birth year] Today's Date: 08/20/2020    History of Present Illness Pt is a 64 y.o. male presenting with R neck pain and radiculopathy down his R arm with bil leg pain, parasthesias, and weakness. Symptoms began in legs then arm then neck. MRI showing no acute change in head.    PT Comments    Pt presented to PT with generalized weakness (LE>UE) but continues to progress with mobility as he was able to perform sit<>stand and stand pivot transfer with min assist. Pt able to weight shift onto either foot (using RW for UE support) and advance LE independently. Pt displays flexed trunk posturing in standing and fatigued quickly.  Once seated in the chair patient reported lightheadedness. BP was stable and reported symptoms resolved after being seated. Patient's son was present to provide translation services for this session. Patient can benefit from continued PT services to progress strengthening to minimize falls risk and return to previous level of independence. PT continues to recommend high-intensity inpatient rehab as pt was independent prior to admission and demonstrates the ability to return to a mod I or supervision level.   Follow Up Recommendations  CIR;Supervision for mobility/OOB     Equipment Recommendations  Rolling walker with 5" wheels;3in1 (PT);Wheelchair (measurements PT);Wheelchair cushion (measurements PT)    Recommendations for Other Services       Precautions / Restrictions Precautions Precautions: Fall Precaution Comments: Translator helpful (son providing interpretation services this session) Restrictions Weight Bearing Restrictions: No    Mobility  Bed Mobility Overal bed mobility: Modified Independent Bed Mobility: Supine to Sit     Supine to sit: HOB elevated;Supervision     General bed mobility comments:  (HOB elevated, utilizes rails to come to EOB with supervision, increased  time required)    Transfers Overall transfer level: Needs assistance Equipment used: Rolling walker (2 wheeled) Transfers: Sit to/from Stand Sit to Stand: Min assist Stand pivot transfers: Min assist       General transfer comment: pt attempted to pull up on RW, needing cues to correct hand placement  Ambulation/Gait Ambulation/Gait assistance: Min assist Gait Distance (Feet): 2 Feet Assistive device: Rolling walker (2 wheeled) Gait Pattern/deviations: Decreased step length - right;Decreased step length - left;Step-to pattern;Decreased weight shift to right;Decreased weight shift to left;Trunk flexed Gait velocity: reduced Gait velocity interpretation: <1.31 ft/sec, indicative of household ambulator General Gait Details: pt needed help to manage walker, needed help as trousers were falling down   Stairs             Wheelchair Mobility    Modified Rankin (Stroke Patients Only)       Balance Overall balance assessment: Needs assistance Sitting-balance support: Feet supported;No upper extremity supported Sitting balance-Leahy Scale: Fair Sitting balance - Comments: able to maintain static sitting without UE support Postural control: Other (comment) (anterior lean in standing) Standing balance support: Bilateral upper extremity supported;During functional activity;Single extremity supported Standing balance-Leahy Scale: Poor Standing balance comment: min assist, RW for UE support. pt with forward flexed posture                            Cognition Arousal/Alertness: Awake/alert Behavior During Therapy: WFL for tasks assessed/performed Overall Cognitive Status: Within Functional Limits for tasks assessed  Exercises General Exercises - Lower Extremity Ankle Circles/Pumps: AROM;Both;10 reps;Seated Gluteal Sets: AROM;Both Heel Slides: AROM;Both;5 reps Straight Leg Raises: AROM;Both;5 reps     General Comments General comments (skin integrity, edema, etc.): reported light headedness, BP stable, pt. denies symptoms after sitting for a brief period      Pertinent Vitals/Pain Pain Assessment: Faces Faces Pain Scale: No hurt    Home Living                      Prior Function            PT Goals (current goals can now be found in the care plan section) Acute Rehab PT Goals Patient Stated Goal: to improve and eventually go home PT Goal Formulation: With patient Time For Goal Achievement: 08/28/20 Potential to Achieve Goals: Good    Frequency    Min 3X/week      PT Plan Current plan remains appropriate    Co-evaluation              AM-PAC PT "6 Clicks" Mobility   Outcome Measure  Help needed turning from your back to your side while in a flat bed without using bedrails?: None Help needed moving from lying on your back to sitting on the side of a flat bed without using bedrails?: None Help needed moving to and from a bed to a chair (including a wheelchair)?: A Little Help needed standing up from a chair using your arms (e.g., wheelchair or bedside chair)?: A Little Help needed to walk in hospital room?: A Lot Help needed climbing 3-5 steps with a railing? : Total 6 Click Score: 17    End of Session Equipment Utilized During Treatment: Gait belt Activity Tolerance: Patient limited by fatigue Patient left: in chair;with call bell/phone within reach;with chair alarm set;with family/visitor present Nurse Communication: Need for lift equipment;Mobility status (stedy for transfers) PT Visit Diagnosis: Unsteadiness on feet (R26.81);Other abnormalities of gait and mobility (R26.89);Muscle weakness (generalized) (M62.81);Difficulty in walking, not elsewhere classified (R26.2);Other symptoms and signs involving the nervous system (R29.898)     Time: 4540-9811 PT Time Calculation (min) (ACUTE ONLY): 16 min  Charges:  $Therapeutic Activity: 8-22  mins                     Dyke Maes, SPT Pager: [phone]   Ilona Sorrel 08/20/2020, 1:18 PM

## 2020-08-20 NOTE — Plan of Care (Signed)

## 2020-08-20 NOTE — Progress Notes (Signed)
NIF -30 with good effort. VC unavailable at this time.

## 2020-08-21 DIAGNOSIS — G610 Guillain-Barre syndrome: Secondary | ICD-10-CM | POA: Diagnosis not present

## 2020-08-21 DIAGNOSIS — R202 Paresthesia of skin: Secondary | ICD-10-CM | POA: Diagnosis not present

## 2020-08-21 DIAGNOSIS — M792 Neuralgia and neuritis, unspecified: Secondary | ICD-10-CM | POA: Diagnosis not present

## 2020-08-21 LAB — GLUCOSE, CAPILLARY
Glucose-Capillary: 121 mg/dL — ABNORMAL HIGH (ref 70–99)
Glucose-Capillary: 101 mg/dL — ABNORMAL HIGH (ref 70–99)
Glucose-Capillary: 160 mg/dL — ABNORMAL HIGH (ref 70–99)
Glucose-Capillary: 119 mg/dL — ABNORMAL HIGH (ref 70–99)

## 2020-08-21 LAB — COMPREHENSIVE METABOLIC PANEL
ALT: 57 U/L — ABNORMAL HIGH (ref 0–44)
AST: 32 U/L (ref 15–41)
Albumin: 2.4 g/dL — ABNORMAL LOW (ref 3.5–5.0)
Alkaline Phosphatase: 77 U/L (ref 38–126)
Anion gap: 8 (ref 5–15)
BUN: 19 mg/dL (ref 8–23)
CO2: 23 mmol/L (ref 22–32)
Calcium: 8.6 mg/dL — ABNORMAL LOW (ref 8.9–10.3)
Chloride: 98 mmol/L (ref 98–111)
Creatinine, Ser: 0.96 mg/dL (ref 0.61–1.24)
GFR, Estimated: >60 mL/min (ref >60)
Glucose, Bld: 138 mg/dL — ABNORMAL HIGH (ref 70–99)
Potassium: 3.8 mmol/L (ref 3.5–5.1)
Sodium: 129 mmol/L — ABNORMAL LOW (ref 135–145)
Total Bilirubin: 0.7 mg/dL (ref 0.3–1.2)
Total Protein: 8.1 g/dL (ref 6.5–8.1)

## 2020-08-21 MED ORDER — HEPARIN SODIUM (PORCINE) 1000 UNIT/ML IJ SOLN: 1000.0000 [IU] | Freq: Once | INTRAMUSCULAR | Status: DC

## 2020-08-21 MED ORDER — ACD FORMULA A 0.73-2.45-2.2 GM/100ML VI SOLN: 1000.0000 mL | Status: DC

## 2020-08-21 MED ORDER — ACD FORMULA A 0.73-2.45-2.2 GM/100ML VI SOLN
Status: AC
  Administered 2020-08-21: 1000 mL
  Filled 2020-08-21: qty 500

## 2020-08-21 MED ORDER — CALCIUM CARBONATE ANTACID 500 MG PO CHEW
2.0000 | CHEWABLE_TABLET | ORAL | Status: AC
  Administered 2020-08-21: 400 mg via ORAL
  Filled 2020-08-21: qty 2

## 2020-08-21 MED ORDER — CALCIUM CARBONATE ANTACID 500 MG PO CHEW: 2.0000 | CHEWABLE_TABLET | ORAL | Status: DC

## 2020-08-21 MED ORDER — SODIUM CHLORIDE 0.9 % IV SOLN
INTRAVENOUS | Status: AC
  Filled 2020-08-21 (×3): qty 200

## 2020-08-21 MED ORDER — SODIUM CHLORIDE 0.9 % IV SOLN: Freq: Once | INTRAVENOUS | Status: DC

## 2020-08-21 MED ORDER — CALCIUM GLUCONATE-NACL 2-0.675 GM/100ML-% IV SOLN: 2.0000 g | Freq: Once | INTRAVENOUS | Status: DC

## 2020-08-21 MED ORDER — DIPHENHYDRAMINE HCL 25 MG PO CAPS: 25.0000 mg | ORAL_CAPSULE | Freq: Four times a day (QID) | ORAL | Status: DC | PRN

## 2020-08-21 MED ORDER — ACETAMINOPHEN 325 MG PO TABS: 650.0000 mg | ORAL_TABLET | ORAL | Status: DC | PRN

## 2020-08-21 MED ORDER — ACETAMINOPHEN 325 MG PO TABS
650.0000 mg | ORAL_TABLET | ORAL | Status: DC | PRN
Start: 2020-08-21 — End: 2020-08-22

## 2020-08-21 MED ORDER — CALCIUM CARBONATE ANTACID 500 MG PO CHEW
CHEWABLE_TABLET | ORAL | Status: AC
  Administered 2020-08-21: 400 mg via ORAL
  Filled 2020-08-21: qty 2

## 2020-08-21 MED ORDER — CALCIUM GLUCONATE-NACL 2-0.675 GM/100ML-% IV SOLN
2.0000 g | Freq: Once | INTRAVENOUS | Status: AC
  Administered 2020-08-21: 2000 mg via INTRAVENOUS
  Filled 2020-08-21: qty 100

## 2020-08-21 NOTE — Progress Notes (Addendum)
Neurology Progress Note  Subjective: No acute overnight events.  Patient evaluated at bedside this morning, no family present in the room.  Patient states he feels tired is not able to sleep at night but feels sleepy in the morning and afternoon.  He states he feels overall worse than yesterday but denies any new complaints denies any respiratory difficulty.  Of note: Patient had his central line placed on 08/19/2020.  He was supposed to get plasma exchange yesterday but did not receive it. HD contacted this morning and they confirm he is scheduled to get it today on 08/21/2020 and then every other day for 5 treatments.  Objective: Current vital signs: BP 105/77 (BP Location: Left Arm)   Pulse 86   Temp 98 F (36.7 C) (Oral)   Resp 20   Ht 5\' 7"  (1.702 m)   Wt 80 kg   SpO2 100%   BMI 27.63 kg/m  Vital signs in last 24 hours: Temp:  [98 F (36.7 C)-99.1 F (37.3 C)] 98 F (36.7 C) (02/11 0814) Pulse Rate:  [83-89] 86 (02/11 0814) Resp:  [18-20] 20 (02/11 0814) BP: (105-112)/(77-78) 105/77 (02/11 0814) SpO2:  [95 %-100 %] 100 % (02/11 0814) Weight:  [80 kg] 80 kg (02/11 0500)  Physical exam GENERAL: Awake, lying comfortably in bed,  in NAD PSYCH: Normal mood and affect. HEENT: Normocephalic and atraumatic, dry mm LUNGS: Normal respiratory effort.  CV: RRR to radial pulse.  ABDOMEN: Soft, nontender Ext: warm  NEURO:  Mental Status: Alert, awake, oriented to time place and person.Speech is fluent.  Naming, repetition, fluency, and comprehension intact. Cranial Nerves:  II: PERRL. Visual fields full.  III, IV, VI: EOMI. Eyelids elevate symmetrically.  V: Sensation is intact to light touch and symmetrical to face.  VII: Smile is symmetrical. Able to puff cheeks and raise eyebrows.  VIII: hearing intact to voice. IX, X: Palate elevates symmetrically. Phonation is normal.  XI: Shoulder shrug 5/5. XII: tongue is midline without fasciculations. Motor: RUE 4/5 biceps, 4-/5 triceps,  grip 4/5. LUE 5/5 throughout. RLE 4-/5 dorsal and plantar flexion. Abduction/adduction RLE 4-/5. Knee RLE 4+/5. LLE 4/5 abductor/adductor. 4/5 knee. 4/5 plantar flexion and 4/5 dorsiflexion.  Tone: is normal and bulk is normal Sensation- Intact to light touch bilaterally in all extremities. RUE is decreased to elbow, but worse in stocking/glove presentation. LUE intact. RLE decreased above knee. Temperature is decreased foot to mid shin. LLE same as RLE.    Coordination: FTN and HKS intact bilaterally, No drift.  DTRs: RUE 1+ biceps and brachioradialis. 0 triceps. LUE 1+ throughout. RLE 0. LLE 0.  Plantars: Toes down going bilaterally  Gait- Deferred  Medications  Current Facility-Administered Medications:  .  acetaminophen (TYLENOL) tablet 650 mg, 650 mg, Oral, Q4H PRN, 650 mg at 08/17/20 0529 **OR** acetaminophen (TYLENOL) 160 MG/5ML solution 650 mg, 650 mg, Per Tube, Q4H PRN **OR** acetaminophen (TYLENOL) suppository 650 mg, 650 mg, Rectal, Q4H PRN, 10/15/20, MD .  acetaminophen (TYLENOL) tablet 650 mg, 650 mg, Oral, Q4H PRN, Jonah Blue, MD .  acetaminophen (TYLENOL) tablet 650 mg, 650 mg, Oral, Q4H PRN, Caryl Pina, MD .  albumin human 25 % 50 g in sodium chloride 0.9 %, , Dialysis, Once in dialysis, Caryl Pina, MD .  albumin human 25 % 50 g in sodium chloride 0.9 %, , Dialysis, Once in dialysis, Caryl Pina, MD .  amLODipine (NORVASC) tablet 5 mg, 5 mg, Oral, Daily, Caryl Pina, Pratik D, DO, 5 mg at 08/21/20 1016 .  calcium carbonate (TUMS - dosed in mg elemental calcium) chewable tablet 400 mg of elemental calcium, 2 tablet, Oral, Q3H, Caryl Pina, MD .  calcium carbonate (TUMS - dosed in mg elemental calcium) chewable tablet 400 mg of elemental calcium, 2 tablet, Oral, Q3H, Caryl Pina, MD .  calcium gluconate 2 g/ 100 mL sodium chloride IVPB, 2 g, Intravenous, Once, Caryl Pina, MD .  calcium gluconate 2 g/ 100 mL sodium chloride IVPB, 2 g, Intravenous, Once, Caryl Pina, MD .  Chlorhexidine Gluconate Cloth 2 % PADS 6 each, 6 each, Topical, Daily, Jerald Kief, MD, 6 each at 08/21/20 1016 .  citrate dextrose (ACD-A anticoagulant) solution 1,000 mL, 1,000 mL, Other, Continuous, Caryl Pina, MD .  citrate dextrose (ACD-A anticoagulant) solution 1,000 mL, 1,000 mL, Other, Continuous, Yazaira Speas, MD .  cyanocobalamin ((VITAMIN B-12)) injection 1,000 mcg, 1,000 mcg, Subcutaneous, Daily, Caryl Pina, MD, 1,000 mcg at 08/21/20 1016 .  [START ON 08/24/2020] cyanocobalamin ((VITAMIN B-12)) injection 1,000 mcg, 1,000 mcg, Intramuscular, Q7 days, Caryl Pina, MD .  diphenhydrAMINE (BENADRYL) capsule 25 mg, 25 mg, Oral, Q6H PRN, Caryl Pina, MD .  diphenhydrAMINE (BENADRYL) capsule 25 mg, 25 mg, Oral, Q6H PRN, Caryl Pina, MD .  enoxaparin (LOVENOX) injection 40 mg, 40 mg, Subcutaneous, Q24H, Jonah Blue, MD, 40 mg at 08/20/20 1539 .  gabapentin (NEURONTIN) capsule 100 mg, 100 mg, Oral, TID, Hazeline Junker B, MD, 100 mg at 08/21/20 1016 .  heparin sodium (porcine) injection 1,000 Units, 1,000 Units, Intracatheter, Once, Caryl Pina, MD .  heparin sodium (porcine) injection 1,000 Units, 1,000 Units, Intracatheter, Once, Caryl Pina, MD .  hydrALAZINE (APRESOLINE) injection 5 mg, 5 mg, Intravenous, Q4H PRN, John Giovanni, MD .  HYDROmorphone (DILAUDID) injection 1 mg, 1 mg, Intravenous, Q3H PRN, Sherryll Burger, Pratik D, DO .  insulin aspart (novoLOG) injection 0-6 Units, 0-6 Units, Subcutaneous, TID WC, Tyrone Nine, MD, 1 Units at 08/20/20 1200 .  lidocaine (PF) (XYLOCAINE) 1 % injection, , , PRN, Mir, Al Corpus, MD, 5 mL at 08/19/20 1549 .  ondansetron (ZOFRAN) injection 4 mg, 4 mg, Intravenous, Q6H PRN, Jonah Blue, MD, 4 mg at 08/19/20 1730 .  oxyCODONE (Oxy IR/ROXICODONE) immediate release tablet 5 mg, 5 mg, Oral, Q4H PRN, Tyrone Nine, MD, 5 mg at 08/21/20 0358 .  rosuvastatin (CRESTOR) tablet 5 mg, 5 mg, Oral, Daily, Hazeline Junker B, MD, 5 mg at  08/21/20 1016 .  senna-docusate (Senokot-S) tablet 1 tablet, 1 tablet, Oral, QHS PRN, Jonah Blue, MD, 1 tablet at 08/17/20 0529 .  zolpidem (AMBIEN) tablet 5 mg, 5 mg, Oral, QHS PRN, Jerald Kief, MD  Pertinent Labs Na  129  08/20/20.    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  -Symptoms/signs are mildly worsened relative to yesterday.  Diffuse weakness, BLE areflexia, upper extremity asymmetric hyporeflexia and sensory loss in multiple modalities exhibiting a stocking glove distribution (sensory loss sparing the left arm, however).  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  - Hyponatremia noted on lab work with Na at 129. 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 Patient euvolemic on fluid restriction today.  - There has beensome improvement with IVIG over the course of treatment; however, symptoms remain disabling .  Placement of central line yesterday on 08/19/2020 and will be receiving  his first plasmapheresis today for a total of 5 treatments over the next 9 days.  - PT suggests [facility] (CIR). - Negative inspiratory force (NIF), -30 with good effort. -Patient has refused LP for further supporting diagnosticinformation regarding probableGBS.  -MRI brain without acute intracranial abnormality. MRI of cervical, thoracic and lumbar spine without evidence for a myelopathy.   Recommendations:  -      Plasma exchange therapy, most likely first treatment today. Will continue QOD for a total of 5 treatments.  -      Continue B12 supplementation.  -      Continue to correct hyponatremia as per primary team. -      Continue to educate the patient regarding GBS, which is the most likely etiology  for his presentation. -      Agree with [facility] (CIR)  Arnoldo Lenis, MD PGY-1 Resident  Electronically signed: Dr. Caryl Pina

## 2020-08-21 NOTE — Progress Notes (Signed)
PT Cancellation Note  Patient Details Name: Jacob Cameron MRN: [id number] DOB: Aug 30, 1955   Cancelled Treatment:    Reason Eval/Treat Not Completed: Patient at procedure or test/unavailable. Pt off floor, assuming getting plasma exchange. Will follow-up as able.  Raymond Gurney, PT, DPT Acute Rehabilitation Services  Pager: [phone] Office: [phone]    Jewel Baize 08/21/2020, 1:41 PM

## 2020-08-21 NOTE — Progress Notes (Signed)
Initial Nutrition Assessment  DOCUMENTATION CODES:   Not applicable  INTERVENTION:   - Attempted to provide DM diet education; however, pt out of room at time of RD visit. Handout left at bedside.  - Glucerna Shake po TID, each supplement provides 220 kcal and 10 grams of protein  NUTRITION DIAGNOSIS:   Increased nutrient needs related to acute illness as evidenced by estimated needs.  GOAL:   Patient will meet greater than or equal to 90% of their needs  MONITOR:   PO intake,Supplement acceptance,Labs,Weight trends  REASON FOR ASSESSMENT:   Consult Diet education  ASSESSMENT:   65 year old male who presented to the ED on 2/02 with paresthesias. No significant PMH. Pt started on IVIG for empiric treatment of GBS.   2/09 - s/p IR placement of non-tunneled dialysis catheter  Noted first plasmapheresis planned for today.  Attempted to speak with pt at bedside. However, pt was not in room at time of RD visit. Suspect pt is off of the unit for plasmapheresis. Unable to provide diet education at this time. Will re-attempt as schedule allows.  Noted pt experiencing N/V earlier this week. Multiple meal completions of 0% noted. However, PO intake has improved to 50-75% on 2/10. No meal completions for today noted.  RD to order oral nutrition supplements to aid pt in meeting kcal and protein needs.  Current weight is consistent with weight from 08/10/20. No prior weights available.  Meal Completion: 0-75%  Medications reviewed and include: vitamin B-12 1000 mcg daily, SSI  Labs reviewed: sodium 129 CBG's: 106-197 x 24 hours  UOP: 1200 x 24 hours  NUTRITION - FOCUSED PHYSICAL EXAM:  Unable to complete at this time. Pt out of room at time of RD attempted visit.  Diet Order:   Diet Order            Diet heart healthy/carb modified Room service appropriate? Yes; Fluid consistency: Thin; Fluid restriction: 1500 mL Fluid  Diet effective now                 EDUCATION  NEEDS:   Not appropriate for education at this time  Skin:  Skin Assessment: Reviewed RN Assessment  Last BM:  08/20/20  Height:   Ht Readings from Last 1 Encounters:  08/12/20 5\' 7"  (1.702 m)    Weight:   Wt Readings from Last 1 Encounters:  08/21/20 80 kg    BMI:  Body mass index is 27.63 kg/m.  Estimated Nutritional Needs:   Kcal:  2000-2200  Protein:  100-115 grams  Fluid:  >/= 2.0 L    10/19/20, MS, RD, LDN Inpatient Clinical Dietitian Please see [facility] for contact information.

## 2020-08-21 NOTE — Progress Notes (Signed)
Physical Therapy Treatment Patient Details Name: Jacob Cameron MRN: [id number] DOB: 04/21/1956 Today's Date: 08/21/2020    History of Present Illness Pt is a 65 y.o. male presenting with R neck pain and radiculopathy down his R arm with bil leg pain, parasthesias, and weakness. Symptoms began in legs then arm then neck. MRI showing no acute change in head.    PT Comments    Pt just returned from plasma exchange session with him feeling fatigues. RN reports his BP was dropping with session and they terminated it early and he will return to complete it tomorrow and repeat every other day for 5 treatment sessions. See General Comments below in regards to BP changes with position and signs/symptoms throughout. Pt continues to display quads weakness and leg coordination deficits resulting in knee buckling/intability with standing, placing pt at risk for falls. Will continue to follow acutely. Current recommendations remain appropriate.    Follow Up Recommendations  CIR;Supervision for mobility/OOB     Equipment Recommendations  Rolling walker with 5" wheels;3in1 (PT);Wheelchair (measurements PT);Wheelchair cushion (measurements PT)    Recommendations for Other Services       Precautions / Restrictions Precautions Precautions: Fall Precaution Comments: Translator helpful; monitor BP (orthostatic at times) (utilized video interpreter) Restrictions Weight Bearing Restrictions: No    Mobility  Bed Mobility Overal bed mobility: Modified Independent Bed Mobility: Supine to Sit;Sit to Supine     Supine to sit: HOB elevated;Supervision Sit to supine: Supervision   General bed mobility comments: Supervision for safety with extra time for all.    Transfers Overall transfer level: Needs assistance Equipment used: Rolling walker (2 wheeled) Transfers: Sit to/from Stand Sit to Stand: Min assist         General transfer comment: Pt pulling up on RW despite cues to push up from bed, minA  for steadying and extra time to power up to stand.  Ambulation/Gait             General Gait Details: deferred due to BP drop   Stairs             Wheelchair Mobility    Modified Rankin (Stroke Patients Only) Modified Rankin (Stroke Patients Only) Pre-Morbid Rankin Score: No symptoms Modified Rankin: Moderately severe disability     Balance Overall balance assessment: Needs assistance Sitting-balance support: Feet supported;No upper extremity supported Sitting balance-Leahy Scale: Fair Sitting balance - Comments: able to maintain static sitting without UE support   Standing balance support: Bilateral upper extremity supported Standing balance-Leahy Scale: Poor Standing balance comment: UE support and minA for static standing with RW, providing tactile cues at quads for knee extension to prevent knee buckling. BP dropped thus returned to supine.                            Cognition Arousal/Alertness: Awake/alert Behavior During Therapy: WFL for tasks assessed/performed Overall Cognitive Status: Within Functional Limits for tasks assessed                                        Exercises      General Comments General comments (skin integrity, edema, etc.): upon arrival pt's BP was 81/68 supine. He denied any nausea, lightheadedness, and dizziness throughout but did say he had a headache but no increase with changes in position. BP improved to 88/74 sitting EOB but decreased to 74/51  in standing., thus returned pt to supine. He looked like his eyes were rolling some but he still denied any signs/symptoms. After several minutes resting in supine BP 94/72. (RN and MD notified)      Pertinent Vitals/Pain Pain Assessment: 0-10 Pain Score: 8  Pain Location: bil legs Pain Descriptors / Indicators: Pins and needles;Grimacing;Guarding Pain Intervention(s): Limited activity within patient's tolerance;Monitored during  session;Repositioned;Patient requesting pain meds-RN notified    Home Living                      Prior Function            PT Goals (current goals can now be found in the care plan section) Acute Rehab PT Goals Patient Stated Goal: to improve and eventually go home PT Goal Formulation: With patient Time For Goal Achievement: 08/28/20 Potential to Achieve Goals: Good Progress towards PT goals: Not progressing toward goals - comment (limited by BP changes today)    Frequency    Min 3X/week      PT Plan Current plan remains appropriate    Co-evaluation              AM-PAC PT "6 Clicks" Mobility   Outcome Measure  Help needed turning from your back to your side while in a flat bed without using bedrails?: None Help needed moving from lying on your back to sitting on the side of a flat bed without using bedrails?: None Help needed moving to and from a bed to a chair (including a wheelchair)?: A Little Help needed standing up from a chair using your arms (e.g., wheelchair or bedside chair)?: A Little Help needed to walk in hospital room?: A Lot Help needed climbing 3-5 steps with a railing? : Total 6 Click Score: 17    End of Session Equipment Utilized During Treatment: Gait belt Activity Tolerance: Patient limited by fatigue;Treatment limited secondary to medical complications (Comment) (BP changes) Patient left: with call bell/phone within reach;with family/visitor present;in bed;with bed alarm set;with nursing/sitter in room Nurse Communication: Mobility status;Other (comment) (BP changes) PT Visit Diagnosis: Unsteadiness on feet (R26.81);Other abnormalities of gait and mobility (R26.89);Muscle weakness (generalized) (M62.81);Difficulty in walking, not elsewhere classified (R26.2);Other symptoms and signs involving the nervous system (R29.898)     Time: 9678-9381 PT Time Calculation (min) (ACUTE ONLY): 24 min  Charges:  $Therapeutic Activity: 23-37  mins                     Jacob Cameron, PT, DPT Acute Rehabilitation Services  Pager: [phone] Office: [phone]    Jacob Cameron 08/21/2020, 5:20 PM

## 2020-08-21 NOTE — Progress Notes (Signed)
PROGRESS NOTE    Jacob Cameron  KAJ:[id number] DOB: 1955/12/24 DOA: 08/12/2020 PCP: Patient, No Pcp Per    Brief Narrative:  65 y.o.malewithout medical history, s/p J&J covid vaccine April 2021 who presented with paresthesias in the right hand and feet with associated pain that has progressed over the previous few days now causing him to be unable to walk. MRI brain was nonacute, so he was admitted to [facility] for neurology evaluation on the advice of teleneurology with MRI C/T/L spine pending. Neurology has initiated IVIG empirically. Symptoms are stable and severely disabling. PT and OT recommend CIR at this time.He has undergone MRI on 2/6 with no acute findings. Refusing LP.  -He will finish his IVIG on 2/8 and then will need placement.  Also noted to be hyponatremic on 2/8 for which IV fluid and fluid restriction started with lab work ordered and pending.  Ongoing need for pain management.  Assessment & Plan:   Principal Problem:   Paresthesias Active Problems:   Tobacco abuse   Polyneuropathy, idiopathic progressive   Distal polyneuropathy: Unclear etiology. Vitamin B12 level wnl at 187. CK wnl. ESR 20, CRP 1.6.  - Neurology directing management:Completed IVIGx5 daysfor empiric Tx(end 2/8) - B12 supplementation due to low-normal level. - Pt reports he's willing to try MRI. D/w RN, neuro, and reordered prn ativan to assist with anxiety/claustrophobia.  - Decision Re: LPrefused -Continuegabapentin low dose. - Therapy recommending CIR. Pt is currently severely debilitated by symptoms and would not be able to return home safely. PM&R consulted. 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 -Sodium is improving with fluid restriction  T2DM: HbA1c 7%, new diagnosis. Given lack of significant symptoms, do not believe this is likely to have been present long enough or severe enough to precipitate acute polyneuropathy. - Diabetes coordinator and dietitian consulted - Start metformin after we know contrasted exams will no longer be neededand after GI symptoms have resolved.  - continued on SSI coverage as needed  Neutrophilic leukocytosis: Afebrile. No meningismus on exam.WBC coming down. - Continue monitoring off antimicrobial therapy.Remains afebrile.ESR, CRP not indicative of severe inflammatory state.  Hypertension -Started amlodipine 5mg  daily on 2/7, cont to titrate bp meds as needed  Dyslipidemia: LDL 192. LFTs wnl.  - Continued onrosuvastatin  Tobacco use:  - Cessation recommended  Elevated TSH: Mildly elevated at 5.311. T3, T4 wnl.  - Would repeat TSH in 4-6 weeks  DVT prophylaxis: Lovenox subq Code Status: Full Family Communication: Pt in room, family at bedside  Status is: Inpatient  Remains inpatient appropriate because:Unsafe d/c plan and IV treatments appropriate due to intensity of illness or inability to take PO   Dispo: The patient is from: Home              Anticipated d/c is to: CIR              Anticipated d/c date is: 3 days              Patient currently is not medically stable to d/c.   Difficult to place patient No    Consultants:   Neurology  IR  Procedures:     Antimicrobials: Anti-infectives (From admission,  onward)   None      Subjective: Without complaints this AM  Objective: Vitals:   08/21/20 1517 08/21/20 1533 08/21/20 1548 08/21/20 1615  BP: (!) $Remov'89/74 95/72 94/71 'aoRgkw$   Pulse: 95 96 95 99  Resp: 20 (!) 22 (!) 22 (!) 22  Temp:    97.9 F (36.6 C)  TempSrc:    Oral  SpO2:    95%  Weight:      Height:        Intake/Output Summary (Last 24 hours) at 08/21/2020 1629 Last  data filed at 08/21/2020 1253 Gross per 24 hour  Intake 832 ml  Output 500 ml  Net 332 ml   Filed Weights   08/12/20 2053 08/21/20 0500  Weight: 80.3 kg 80 kg    Examination: General exam: Conversant, in no acute distress Respiratory system: normal chest rise, clear, no audible wheezing Cardiovascular system: regular rhythm, s1-s2 Gastrointestinal system: Nondistended, nontender, pos BS Central nervous system: No seizures, no tremors Extremities: No cyanosis, no joint deformities Skin: No rashes, no pallor Psychiatry: Affect normal // no auditory hallucinations   Data Reviewed: I have personally reviewed following labs and imaging studies  CBC: Recent Labs  Lab 08/15/20 0245 08/18/20 0105 08/20/20 0449  WBC 14.5* 11.9* 10.4  NEUTROABS 12.0*  --   --   HGB 14.0 14.0 13.4  HCT 43.0 41.8 38.8*  MCV 84.8 82.0 81.7  PLT 352 362 902   Basic Metabolic Panel: Recent Labs  Lab 08/18/20 0105 08/19/20 0359 08/20/20 0449 08/20/20 2123 08/21/20 0305  NA 124* 125* 126* 127* 129*  K 4.0 3.6 3.7 4.0 3.8  CL 92* 94* 95* 95* 98  CO2 $Re'22 22 22 23 23  'XJy$ GLUCOSE 134* 126* 128* 132* 138*  BUN 17 19 24* 21 19  CREATININE 0.92 0.94 0.99 1.10 0.96  CALCIUM 8.4* 8.5* 8.7* 8.6* 8.6*  MG 2.0  --   --   --   --    GFR: Estimated Creatinine Clearance: 76.8 mL/min (by C-G formula based on SCr of 0.96 mg/dL). Liver Function Tests: Recent Labs  Lab 08/15/20 0245 08/20/20 0449 08/21/20 0305  AST 16 45* 32  ALT 17 64* 57*  ALKPHOS 67 69 77  BILITOT 1.1 0.7 0.7  PROT 7.4 7.6 8.1  ALBUMIN 3.1* 2.3* 2.4*   No results for input(s): LIPASE, AMYLASE in the last 168 hours. No results for input(s): AMMONIA in the last 168 hours. Coagulation Profile: No results for input(s): INR, PROTIME in the last 168 hours. Cardiac Enzymes: No results for input(s): CKTOTAL, CKMB, CKMBINDEX, TROPONINI in the last 168 hours. BNP (last 3 results) No results for input(s): PROBNP in the last 8760  hours. HbA1C: No results for input(s): HGBA1C in the last 72 hours. CBG: Recent Labs  Lab 08/20/20 1117 08/20/20 1546 08/20/20 2128 08/21/20 0609 08/21/20 1150  GLUCAP 197* 106* 111* 121* 101*   Lipid Profile: No results for input(s): CHOL, HDL, LDLCALC, TRIG, CHOLHDL, LDLDIRECT in the last 72 hours. Thyroid Function Tests: No results for input(s): TSH, T4TOTAL, FREET4, T3FREE, THYROIDAB in the last 72 hours. Anemia Panel: No results for input(s): VITAMINB12, FOLATE, FERRITIN, TIBC, IRON, RETICCTPCT in the last 72 hours. Sepsis Labs: No results for input(s): PROCALCITON, LATICACIDVEN in the last 168 hours.  Recent Results (from the past 240 hour(s))  SARS Coronavirus 2 by RT PCR (hospital order, performed in [facility] hospital lab) Nasopharyngeal Nasopharyngeal Swab     Status: None   Collection Time: 08/13/20  1:50 AM   Specimen: Nasopharyngeal Swab  Result Value Ref Range Status   SARS Coronavirus 2 NEGATIVE NEGATIVE Final    Comment: (NOTE) SARS-CoV-2 target nucleic acids are NOT DETECTED.  The SARS-CoV-2 RNA is generally detectable in upper and lower respiratory specimens during the acute phase of infection. The lowest concentration of SARS-CoV-2 viral copies this assay can detect is 250 copies / mL. A negative result does not preclude SARS-CoV-2 infection and should not be used as the sole basis for treatment or other patient management decisions.  A negative result may occur with improper specimen collection / handling, submission of specimen other than nasopharyngeal swab, presence of viral mutation(s) within the areas targeted by this assay, and inadequate number of viral copies (<250 copies / mL). A negative result must be combined with clinical observations, patient history, and epidemiological information.  Fact Sheet for Patients:   StrictlyIdeas.no  Fact Sheet for Healthcare  Providers: BankingDealers.co.za  This test is not yet approved or  cleared by the [place] FDA and has been authorized for detection and/or diagnosis of SARS-CoV-2 by FDA under an Emergency Use Authorization (EUA).  This EUA will remain in effect (meaning this test can be used) for the duration of the COVID-19 declaration under Section 564(b)(1) of the Act, 21 U.S.C. section 360bbb-3(b)(1), unless the authorization is terminated or revoked sooner.  Performed at [facility], [address], [place], [place] [address]      Radiology Studies: No results found.  Scheduled Meds: . amLODipine  5 mg Oral Daily  . calcium carbonate  2 tablet Oral Q3H  . Chlorhexidine Gluconate Cloth  6 each Topical Daily  . cyanocobalamin  1,000 mcg Subcutaneous Daily  . [START ON 08/24/2020] cyanocobalamin  1,000 mcg Intramuscular Q7 days  . enoxaparin (LOVENOX) injection  40 mg Subcutaneous Q24H  . gabapentin  100 mg Oral TID  . heparin sodium (porcine)  1,000 Units Intracatheter Once  . insulin aspart  0-6 Units Subcutaneous TID WC  . rosuvastatin  5 mg Oral Daily   Continuous Infusions: . citrate dextrose    . citrate dextrose       LOS: 7 days   Marylu Lund, MD [facility] Pager On Amion  If 7PM-7AM, please contact night-coverage 08/21/2020, 4:29 PM

## 2020-08-21 NOTE — Progress Notes (Signed)
NIF -30 with good effort. VC unavailable at this time.  

## 2020-08-22 DIAGNOSIS — M792 Neuralgia and neuritis, unspecified: Secondary | ICD-10-CM | POA: Diagnosis not present

## 2020-08-22 LAB — POCT I-STAT, CHEM 8
BUN: 20 mg/dL (ref 8–23)
Calcium, Ion: 1.33 mmol/L (ref 1.15–1.40)
Chloride: 95 mmol/L — ABNORMAL LOW (ref 98–111)
Creatinine, Ser: 0.9 mg/dL (ref 0.61–1.24)
Glucose, Bld: 215 mg/dL — ABNORMAL HIGH (ref 70–99)
HCT: 44 % (ref 39.0–52.0)
Hemoglobin: 15 g/dL (ref 13.0–17.0)
Potassium: 4.1 mmol/L (ref 3.5–5.1)
Sodium: 131 mmol/L — ABNORMAL LOW (ref 135–145)
TCO2: 23 mmol/L (ref 22–32)

## 2020-08-22 LAB — CBC
HCT: 41.2 % (ref 39.0–52.0)
Hemoglobin: 14 g/dL (ref 13.0–17.0)
MCH: 28.3 pg (ref 26.0–34.0)
MCHC: 34 g/dL (ref 30.0–36.0)
MCV: 83.4 fL (ref 80.0–100.0)
Platelets: 371 10*3/uL (ref 150–400)
RBC: 4.94 MIL/uL (ref 4.22–5.81)
RDW: 12.9 % (ref 11.5–15.5)
WBC: 13.7 10*3/uL — ABNORMAL HIGH (ref 4.0–10.5)
nRBC: 0 % (ref 0.0–0.2)

## 2020-08-22 LAB — COMPREHENSIVE METABOLIC PANEL
ALT: 32 U/L (ref 0–44)
AST: 20 U/L (ref 15–41)
Albumin: 3.5 g/dL (ref 3.5–5.0)
Alkaline Phosphatase: 43 U/L (ref 38–126)
Anion gap: 6 (ref 5–15)
BUN: 17 mg/dL (ref 8–23)
CO2: 24 mmol/L (ref 22–32)
Calcium: 8.8 mg/dL — ABNORMAL LOW (ref 8.9–10.3)
Chloride: 101 mmol/L (ref 98–111)
Creatinine, Ser: 1.06 mg/dL (ref 0.61–1.24)
GFR, Estimated: >60 mL/min (ref >60)
Glucose, Bld: 131 mg/dL — ABNORMAL HIGH (ref 70–99)
Potassium: 4.3 mmol/L (ref 3.5–5.1)
Sodium: 131 mmol/L — ABNORMAL LOW (ref 135–145)
Total Bilirubin: 0.7 mg/dL (ref 0.3–1.2)
Total Protein: 6.4 g/dL — ABNORMAL LOW (ref 6.5–8.1)

## 2020-08-22 LAB — GLUCOSE, CAPILLARY
Glucose-Capillary: 122 mg/dL — ABNORMAL HIGH (ref 70–99)
Glucose-Capillary: 145 mg/dL — ABNORMAL HIGH (ref 70–99)
Glucose-Capillary: 119 mg/dL — ABNORMAL HIGH (ref 70–99)
Glucose-Capillary: 127 mg/dL — ABNORMAL HIGH (ref 70–99)
Glucose-Capillary: 166 mg/dL — ABNORMAL HIGH (ref 70–99)

## 2020-08-22 MED ORDER — ACD FORMULA A 0.73-2.45-2.2 GM/100ML VI SOLN: 1000.0000 mL | Status: DC

## 2020-08-22 MED ORDER — CALCIUM GLUCONATE-NACL 2-0.675 GM/100ML-% IV SOLN
INTRAVENOUS | Status: AC
  Administered 2020-08-22: 2000 mg via INTRAVENOUS
  Filled 2020-08-22: qty 100

## 2020-08-22 MED ORDER — CALCIUM CARBONATE ANTACID 500 MG PO CHEW
2.0000 | CHEWABLE_TABLET | ORAL | Status: AC
  Administered 2020-08-22: 400 mg via ORAL

## 2020-08-22 MED ORDER — CALCIUM CARBONATE ANTACID 500 MG PO CHEW
CHEWABLE_TABLET | ORAL | Status: AC
  Administered 2020-08-22: 400 mg via ORAL
  Filled 2020-08-22: qty 4

## 2020-08-22 MED ORDER — ACD FORMULA A 0.73-2.45-2.2 GM/100ML VI SOLN
Status: AC
  Administered 2020-08-22: 1000 mL
  Filled 2020-08-22: qty 500

## 2020-08-22 MED ORDER — SODIUM CHLORIDE 0.9 % IV SOLN
INTRAVENOUS | Status: AC
  Filled 2020-08-22 (×3): qty 200

## 2020-08-22 MED ORDER — BENZONATATE 100 MG PO CAPS
100.0000 mg | ORAL_CAPSULE | Freq: Three times a day (TID) | ORAL | Status: DC | PRN
  Administered 2020-08-22: 100 mg via ORAL
  Filled 2020-08-22: qty 1

## 2020-08-22 MED ORDER — GABAPENTIN 100 MG PO CAPS
200.0000 mg | ORAL_CAPSULE | Freq: Three times a day (TID) | ORAL | Status: DC
  Administered 2020-08-22 – 2020-08-24 (×7): 200 mg via ORAL
  Filled 2020-08-22 (×7): qty 2

## 2020-08-22 MED ORDER — ACETAMINOPHEN 325 MG PO TABS: 650.0000 mg | ORAL_TABLET | ORAL | Status: DC | PRN

## 2020-08-22 MED ORDER — CALCIUM GLUCONATE-NACL 2-0.675 GM/100ML-% IV SOLN
2.0000 g | Freq: Once | INTRAVENOUS | Status: AC
  Filled 2020-08-22: qty 100

## 2020-08-22 MED ORDER — HEPARIN SODIUM (PORCINE) 1000 UNIT/ML IJ SOLN: 1000.0000 [IU] | Freq: Once | INTRAMUSCULAR | Status: DC

## 2020-08-22 MED ORDER — DIPHENHYDRAMINE HCL 25 MG PO CAPS: 25.0000 mg | ORAL_CAPSULE | Freq: Four times a day (QID) | ORAL | Status: DC | PRN

## 2020-08-22 MED ORDER — HEPARIN SODIUM (PORCINE) 1000 UNIT/ML IJ SOLN
INTRAMUSCULAR | Status: AC
  Filled 2020-08-22: qty 3

## 2020-08-22 NOTE — Progress Notes (Signed)
NIF -30, good patient effort.  VC unavailable at this time.

## 2020-08-22 NOTE — Progress Notes (Signed)
PROGRESS NOTE    Jacob Cameron  AYO:[id number] DOB: 05/07/1956 DOA: 08/12/2020 PCP: Patient, No Pcp Per    Brief Narrative:  65 y.o.malewithout medical history, s/p J&J covid vaccine April 2021 who presented with paresthesias in the right hand and feet with associated pain that has progressed over the previous few days now causing him to be unable to walk. MRI brain was nonacute, so he was admitted to [facility] for neurology evaluation on the advice of teleneurology with MRI C/T/L spine pending. Neurology has initiated IVIG empirically. Symptoms are stable and severely disabling. PT and OT recommend CIR at this time.He has undergone MRI on 2/6 with no acute findings. Refusing LP.  -He will finish his IVIG on 2/8 and then will need placement.  Also noted to be hyponatremic on 2/8 for which IV fluid and fluid restriction started with lab work ordered and pending.  Ongoing need for pain management.  Assessment & Plan:   Principal Problem:   Paresthesias Active Problems:   Tobacco abuse   Polyneuropathy, idiopathic progressive   Distal polyneuropathy: Unclear etiology. Vitamin B12 level wnl at 187. CK wnl. ESR 20, CRP 1.6.  - Neurology directing management:Completed IVIGx5 daysfor empiric Tx(end 2/8) - B12 supplementation due to low-normal level. - Pt reports he's willing to try MRI. D/w RN, neuro, and reordered prn ativan to assist with anxiety/claustrophobia.  - Decision Re: LPrefused -Continuegabapentin low dose. - Therapy recommending CIR. Pt is currently severely debilitated by symptoms and would not be able to return home safely. PM&R consulted. -Per Neurology, plan for total 5 days of plasma exchange -Complains of continued LE pain not improved with current dose of neurontin. 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 -Sodium trends are improving with fluid restriction  T2DM: HbA1c 7%, new diagnosis. Given lack of significant symptoms, do not believe this is likely to have been present long enough or severe enough to precipitate acute polyneuropathy. - Diabetes coordinator and dietitian consulted - Start metformin after we know contrasted exams will no longer be neededand after GI symptoms have resolved.  - continue with SSI coverage as needed  Neutrophilic leukocytosis: Afebrile. No meningismus on exam.WBC coming down. - Continue monitoring off antimicrobial therapy.Remains afebrile.ESR, CRP not indicative of severe inflammatory state.  Hypertension -Started amlodipine 556mdaily on 2/7 -Pt noted to be hypotensive on plasma exchange. Have held antihypertensive meds  Dyslipidemia: LDL 192. LFTs wnl.  - Continued onrosuvastatin  Tobacco use:  - Cessation recommended  Elevated TSH: Mildly elevated at 5.311. T3, T4 wnl.  - Would repeat TSH in 4-6 weeks  DVT prophylaxis: Lovenox subq Code Status: Full Family Communication: Pt in room, family currently not at bedside  Status is: Inpatient  Remains inpatient appropriate because:Unsafe d/c plan and IV treatments appropriate due to intensity of illness or inability to take PO   Dispo: The patient is from: Home              Anticipated d/c is to: CIR              Anticipated d/c date is: 3 days              Patient currently is not medically stable to d/c.  Difficult to place patient No   Consultants:   Neurology  IR  Procedures:     Antimicrobials: Anti-infectives (From admission, onward)   None      Subjective: Without complaints this AM  Objective: Vitals:   08/22/20 1147 08/22/20 1203 08/22/20 1230 08/22/20 1245  BP: (!) 77/58 (!) 85/56 (!) 70/61 (!) 81/58  Pulse: 92 85 94 88  Resp: 19  (!) 22 (!) 21 19  Temp:    98.5 F (36.9 C)  TempSrc:    Oral  SpO2: 94% 94% 93% 95%  Weight:      Height:        Intake/Output Summary (Last 24 hours) at 08/22/2020 1340 Last data filed at 08/22/2020 0900 Gross per 24 hour  Intake 460 ml  Output --  Net 460 ml   Filed Weights   08/12/20 2053 08/21/20 0500  Weight: 80.3 kg 80 kg    Examination: General exam: Awake, laying in bed, in nad Respiratory system: Normal respiratory effort, no wheezing Cardiovascular system: regular rate, s1, s2 Gastrointestinal system: Soft, nondistended, positive BS Central nervous system: CN2-12 grossly intact, strength intact Extremities: Perfused, no clubbing Skin: Normal skin turgor, no notable skin lesions seen Psychiatry: Mood normal // no visual hallucinations   Data Reviewed: I have personally reviewed following labs and imaging studies  CBC: Recent Labs  Lab 08/18/20 0105 08/20/20 0449 08/22/20 1023 08/22/20 1048  WBC 11.9* 10.4  --  13.7*  HGB 14.0 13.4 15.0 14.0  HCT 41.8 38.8* 44.0 41.2  MCV 82.0 81.7  --  83.4  PLT 362 346  --  144   Basic Metabolic Panel: Recent Labs  Lab 08/18/20 0105 08/19/20 0359 08/20/20 0449 08/20/20 2123 08/21/20 0305 08/22/20 0556 08/22/20 1023  NA 124* 125* 126* 127* 129* 131* 131*  K 4.0 3.6 3.7 4.0 3.8 4.3 4.1  CL 92* 94* 95* 95* 98 101 95*  CO2 _0 --   GLUCOSE 134* 126* 128* 132* 138* 131* 215*  BUN 17 19 24* _1 CREATININE 0.92 0.94 0.99 1.10 0.96 1.06 0.90  CALCIUM 8.4* 8.5* 8.7* 8.6* 8.6* 8.8*  --   MG 2.0  --   --   --   --   --   --    GFR: Estimated Creatinine Clearance: 81.9 mL/min (by C-G formula based on SCr of 0.9 mg/dL). Liver Function Tests: Recent Labs  Lab 08/20/20 0449 08/21/20 0305 08/22/20 0556  AST 45* 32 20  ALT 64* 57* 32  ALKPHOS 69 77 43  BILITOT 0.7 0.7 0.7  PROT 7.6 8.1 6.4*  ALBUMIN 2.3* 2.4* 3.5   No results for input(s): LIPASE, AMYLASE in the last 168 hours. No  results for input(s): AMMONIA in the last 168 hours. Coagulation Profile: No results for input(s): INR, PROTIME in the last 168 hours. Cardiac Enzymes: No results for input(s): CKTOTAL, CKMB, CKMBINDEX, TROPONINI in the last 168 hours. BNP (last 3 results) No results for input(s): PROBNP in the last 8760 hours. HbA1C: No results for input(s): HGBA1C in the last 72 hours. CBG: Recent Labs  Lab 08/21/20 1150 08/21/20 1816 08/21/20 2125 08/22/20 0623 08/22/20 0758  GLUCAP 101* 160* 119* 122* 145*   Lipid Profile: No results for input(s): CHOL, HDL, LDLCALC, TRIG, CHOLHDL, LDLDIRECT in the last 72 hours. Thyroid Function Tests: No results for input(s): TSH, T4TOTAL, FREET4, T3FREE, THYROIDAB in the last 72 hours. Anemia Panel: No results for input(s): VITAMINB12,  FOLATE, FERRITIN, TIBC, IRON, RETICCTPCT in the last 72 hours. Sepsis Labs: No results for input(s): PROCALCITON, LATICACIDVEN in the last 168 hours.  Recent Results (from the past 240 hour(s))  SARS Coronavirus 2 by RT PCR (hospital order, performed in [facility] hospital lab) Nasopharyngeal Nasopharyngeal Swab     Status: None   Collection Time: 08/13/20  1:50 AM   Specimen: Nasopharyngeal Swab  Result Value Ref Range Status   SARS Coronavirus 2 NEGATIVE NEGATIVE Final    Comment: (NOTE) SARS-CoV-2 target nucleic acids are NOT DETECTED.  The SARS-CoV-2 RNA is generally detectable in upper and lower respiratory specimens during the acute phase of infection. The lowest concentration of SARS-CoV-2 viral copies this assay can detect is 250 copies / mL. A negative result does not preclude SARS-CoV-2 infection and should not be used as the sole basis for treatment or other patient management decisions.  A negative result may occur with improper specimen collection / handling, submission of specimen other than nasopharyngeal swab, presence of viral mutation(s) within the areas targeted by this assay, and inadequate  number of viral copies (<250 copies / mL). A negative result must be combined with clinical observations, patient history, and epidemiological information.  Fact Sheet for Patients:   StrictlyIdeas.no  Fact Sheet for Healthcare Providers: BankingDealers.co.za  This test is not yet approved or  cleared by the [place] FDA and has been authorized for detection and/or diagnosis of SARS-CoV-2 by FDA under an Emergency Use Authorization (EUA).  This EUA will remain in effect (meaning this test can be used) for the duration of the COVID-19 declaration under Section 564(b)(1) of the Act, 21 U.S.C. section 360bbb-3(b)(1), unless the authorization is terminated or revoked sooner.  Performed at [facility], [address], [place], [place] [address]      Radiology Studies: No results found.  Scheduled Meds: . Chlorhexidine Gluconate Cloth  6 each Topical Daily  . cyanocobalamin  1,000 mcg Subcutaneous Daily  . [START ON 08/24/2020] cyanocobalamin  1,000 mcg Intramuscular Q7 days  . enoxaparin (LOVENOX) injection  40 mg Subcutaneous Q24H  . gabapentin  100 mg Oral TID  . heparin sodium (porcine)      . insulin aspart  0-6 Units Subcutaneous TID WC  . rosuvastatin  5 mg Oral Daily   Continuous Infusions:    LOS: 8 days   Marylu Lund, MD [facility] Pager On Amion  If 7PM-7AM, please contact night-coverage 08/22/2020, 1:40 PM

## 2020-08-22 NOTE — Progress Notes (Addendum)
Called to give report to Dialysis RN at [phone], told they will call back   Transporter arrived for pt to go to dialysis, called at [phone] and gave report to [facility] RN  report received at 1257, pt back on unit

## 2020-08-23 DIAGNOSIS — M792 Neuralgia and neuritis, unspecified: Secondary | ICD-10-CM | POA: Diagnosis not present

## 2020-08-23 LAB — CBC
HCT: 40.9 % (ref 39.0–52.0)
Hemoglobin: 14 g/dL (ref 13.0–17.0)
MCH: 28.2 pg (ref 26.0–34.0)
MCHC: 34.2 g/dL (ref 30.0–36.0)
MCV: 82.5 fL (ref 80.0–100.0)
Platelets: 353 10*3/uL (ref 150–400)
RBC: 4.96 MIL/uL (ref 4.22–5.81)
RDW: 13 % (ref 11.5–15.5)
WBC: 15.6 10*3/uL — ABNORMAL HIGH (ref 4.0–10.5)
nRBC: 0 % (ref 0.0–0.2)

## 2020-08-23 LAB — GLUCOSE, CAPILLARY
Glucose-Capillary: 124 mg/dL — ABNORMAL HIGH (ref 70–99)
Glucose-Capillary: 102 mg/dL — ABNORMAL HIGH (ref 70–99)
Glucose-Capillary: 120 mg/dL — ABNORMAL HIGH (ref 70–99)
Glucose-Capillary: 140 mg/dL — ABNORMAL HIGH (ref 70–99)

## 2020-08-23 LAB — COMPREHENSIVE METABOLIC PANEL
ALT: 20 U/L (ref 0–44)
AST: 15 U/L (ref 15–41)
Albumin: 4 g/dL (ref 3.5–5.0)
Alkaline Phosphatase: 29 U/L — ABNORMAL LOW (ref 38–126)
Anion gap: 8 (ref 5–15)
BUN: 16 mg/dL (ref 8–23)
CO2: 22 mmol/L (ref 22–32)
Calcium: 8.8 mg/dL — ABNORMAL LOW (ref 8.9–10.3)
Chloride: 102 mmol/L (ref 98–111)
Creatinine, Ser: 0.98 mg/dL (ref 0.61–1.24)
GFR, Estimated: >60 mL/min (ref >60)
Glucose, Bld: 135 mg/dL — ABNORMAL HIGH (ref 70–99)
Potassium: 4.3 mmol/L (ref 3.5–5.1)
Sodium: 132 mmol/L — ABNORMAL LOW (ref 135–145)
Total Bilirubin: 0.6 mg/dL (ref 0.3–1.2)
Total Protein: 5.8 g/dL — ABNORMAL LOW (ref 6.5–8.1)

## 2020-08-23 NOTE — Progress Notes (Signed)
PROGRESS NOTE    Jacob Cameron  AZM:[id number] DOB: Sep 13, 1955 DOA: 08/12/2020 PCP: Patient, No Pcp Per    Brief Narrative:  65 y.o.malewithout medical history, s/p J&J covid vaccine April 2021 who presented with paresthesias in the right hand and feet with associated pain that has progressed over the previous few days now causing him to be unable to walk. MRI brain was nonacute, so he was admitted to [facility] for neurology evaluation on the advice of teleneurology with MRI C/T/L spine pending. Neurology has initiated IVIG empirically. Symptoms are stable and severely disabling. PT and OT recommend CIR at this time.He has undergone MRI on 2/6 with no acute findings. Refusing LP.  -He will finish his IVIG on 2/8 and then will need placement.  Also noted to be hyponatremic on 2/8 for which IV fluid and fluid restriction started with lab work ordered and pending.  Ongoing need for pain management.  Assessment & Plan:   Principal Problem:   Paresthesias Active Problems:   Tobacco abuse   Polyneuropathy, idiopathic progressive   Distal polyneuropathy: Unclear etiology. Vitamin B12 level wnl at 187. CK wnl. ESR 20, CRP 1.6.  - Neurology directing management:Completed IVIGx5 daysfor empiric Tx(ended 2/8) - B12 supplementation due to low-normal level. - Pt reports he's willing to try MRI. D/w RN, neuro, and reordered prn ativan to assist with anxiety/claustrophobia.  - Decision Re: LPrefused -Continuegabapentin low dose. - Therapy recommending CIR. Pt is currently severely debilitated by symptoms and would not be able to return home safely. PM&R consulted. 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 -Sodium trends are improving  T2DM: HbA1c 7%, new diagnosis. Given lack of significant symptoms, do not believe this is likely to have been present long enough or severe enough to precipitate acute polyneuropathy. - Diabetes coordinator and dietitian consulted - Start metformin after we know contrasted exams will no longer be neededand after GI symptoms have resolved.  - continue with SSI coverage as pt needs  Neutrophilic leukocytosis: Afebrile. No meningismus on exam.WBC coming down. - Continue monitoring off antimicrobial therapy.Remains afebrile.ESR, CRP not indicative of severe inflammatory state.  Hypertension -Started amlodipine 5mg  daily on 2/7 -Pt noted to be hypotensive on plasma exchange. Have held antihypertensive meds  Dyslipidemia: LDL 192. LFTs wnl.  - Continued onrosuvastatin  Tobacco use:  - Recommend tobacco cessation  Elevated TSH: Mildly elevated at 5.311. T3, T4 wnl.  - Would repeat TSH in 4-6 weeks  DVT prophylaxis: Lovenox subq Code Status: Full Family Communication: Pt in room, family currently not at bedside  Status is: Inpatient  Remains inpatient appropriate because:Unsafe d/c plan and IV treatments appropriate due to intensity of illness or inability to take PO   Dispo: The patient is from: Home              Anticipated d/c is to: CIR              Anticipated d/c date is: 3 days              Patient currently is not medically stable to d/c.   Difficult to place patient No  Consultants:   Neurology  IR  Procedures:     Antimicrobials: Anti-infectives (From admission, onward)   None      Subjective: No complaints this AM. States LE pain seems somewhat improved  Objective: Vitals:   08/22/20 2354 08/23/20 0342 08/23/20 0822 08/23/20 1257  BP: 104/72 1$RemoveBef'04/61 98/71 91/67 'MZcNhexkam$  Pulse: 87 88 90 94  Resp: $Remo'18 18 18 16  'DUwvj$ Temp: 98.7 F  (37.1 C) 98 F (36.7 C) 98.4 F (36.9 C) 97.6 F (36.4 C)  TempSrc: Oral Oral Oral Oral  SpO2: 95% 93% 96% 98%  Weight:      Height:        Intake/Output Summary (Last 24 hours) at 08/23/2020 1431 Last data filed at 08/23/2020 0415 Gross per 24 hour  Intake 30 ml  Output 550 ml  Net -520 ml   Filed Weights   08/12/20 2053 08/21/20 0500  Weight: 80.3 kg 80 kg    Examination: General exam: Conversant, in no acute distress Respiratory system: normal chest rise, clear, no audible wheezing Cardiovascular system: regular rhythm, s1-s2 Gastrointestinal system: Nondistended, nontender, pos BS Central nervous system: No seizures, no tremors Extremities: No cyanosis, no joint deformities Skin: No rashes, no pallor Psychiatry: Affect normal // no auditory hallucinations   Data Reviewed: I have personally reviewed following labs and imaging studies  CBC: Recent Labs  Lab 08/18/20 0105 08/20/20 0449 08/22/20 1023 08/22/20 1048 08/23/20 0400  WBC 11.9* 10.4  --  13.7* 15.6*  HGB 14.0 13.4 15.0 14.0 14.0  HCT 41.8 38.8* 44.0 41.2 40.9  MCV 82.0 81.7  --  83.4 82.5  PLT 362 346  --  371 725   Basic Metabolic Panel: Recent Labs  Lab 08/18/20 0105 08/19/20 0359 08/20/20 0449 08/20/20 2123 08/21/20 0305 08/22/20 0556 08/22/20 1023 08/23/20 0400  NA 124*   < > 126* 127* 129* 131* 131* 132*  K 4.0   < > 3.7 4.0 3.8 4.3 4.1 4.3  CL 92*   < > 95* 95* 98 101 95* 102  CO2 22   < > $R'22 23 23 24  'Nr$ --  22  GLUCOSE 134*   < > 128* 132* 138* 131* 215* 135*  BUN 17   < > 24* $Rem'21 19 17 20 16  'eAcE$ CREATININE 0.92   < > 0.99 1.10 0.96 1.06 0.90 0.98  CALCIUM 8.4*   < > 8.7* 8.6* 8.6* 8.8*  --  8.8*  MG 2.0  --   --   --   --   --   --   --    < > = values in this interval not displayed.   GFR: Estimated Creatinine Clearance: 75.2 mL/min (by C-G formula based on SCr of 0.98 mg/dL). Liver Function Tests: Recent Labs  Lab 08/20/20 0449 08/21/20 0305 08/22/20 0556 08/23/20 0400  AST  45* 32 20 15  ALT 64* 57* 32 20  ALKPHOS 69 77 43 29*  BILITOT 0.7 0.7 0.7 0.6  PROT 7.6 8.1 6.4* 5.8*  ALBUMIN 2.3* 2.4* 3.5 4.0   No results for input(s): LIPASE, AMYLASE in the last 168 hours. No results for input(s): AMMONIA in the last 168 hours. Coagulation Profile: No results for input(s): INR, PROTIME in the last 168 hours. Cardiac Enzymes: No results for input(s): CKTOTAL, CKMB, CKMBINDEX, TROPONINI in the last 168 hours. BNP (last 3 results) No results for input(s): PROBNP in the last 8760 hours. HbA1C: No results for input(s): HGBA1C in the last 72 hours. CBG: Recent  Labs  Lab 08/22/20 1341 08/22/20 1643 08/22/20 2126 08/23/20 0633 08/23/20 1147  GLUCAP 119* 127* 166* 124* 102*   Lipid Profile: No results for input(s): CHOL, HDL, LDLCALC, TRIG, CHOLHDL, LDLDIRECT in the last 72 hours. Thyroid Function Tests: No results for input(s): TSH, T4TOTAL, FREET4, T3FREE, THYROIDAB in the last 72 hours. Anemia Panel: No results for input(s): VITAMINB12, FOLATE, FERRITIN, TIBC, IRON, RETICCTPCT in the last 72 hours. Sepsis Labs: No results for input(s): PROCALCITON, LATICACIDVEN in the last 168 hours.  No results found for this or any previous visit (from the past 240 hour(s)).   Radiology Studies: No results found.  Scheduled Meds: . Chlorhexidine Gluconate Cloth  6 each Topical Daily  . cyanocobalamin  1,000 mcg Subcutaneous Daily  . [START ON 08/24/2020] cyanocobalamin  1,000 mcg Intramuscular Q7 days  . enoxaparin (LOVENOX) injection  40 mg Subcutaneous Q24H  . gabapentin  200 mg Oral TID  . insulin aspart  0-6 Units Subcutaneous TID WC  . rosuvastatin  5 mg Oral Daily   Continuous Infusions:    LOS: 9 days   Marylu Lund, MD [facility] Pager On Amion  If 7PM-7AM, please contact night-coverage 08/23/2020, 2:31 PM

## 2020-08-23 NOTE — Plan of Care (Signed)

## 2020-08-23 NOTE — Progress Notes (Signed)
-  15 NIF,patient seemed more tired and had to be reminded to take a deep breath in.  VC is unavailable

## 2020-08-24 DIAGNOSIS — M792 Neuralgia and neuritis, unspecified: Secondary | ICD-10-CM | POA: Diagnosis not present

## 2020-08-24 DIAGNOSIS — G610 Guillain-Barre syndrome: Secondary | ICD-10-CM | POA: Diagnosis not present

## 2020-08-24 LAB — GLUCOSE, CAPILLARY
Glucose-Capillary: 173 mg/dL — ABNORMAL HIGH (ref 70–99)
Glucose-Capillary: 111 mg/dL — ABNORMAL HIGH (ref 70–99)
Glucose-Capillary: 225 mg/dL — ABNORMAL HIGH (ref 70–99)
Glucose-Capillary: 216 mg/dL — ABNORMAL HIGH (ref 70–99)

## 2020-08-24 LAB — COMPREHENSIVE METABOLIC PANEL
ALT: 24 U/L (ref 0–44)
AST: 19 U/L (ref 15–41)
Albumin: 3.4 g/dL — ABNORMAL LOW (ref 3.5–5.0)
Alkaline Phosphatase: 36 U/L — ABNORMAL LOW (ref 38–126)
Anion gap: 6 (ref 5–15)
BUN: 11 mg/dL (ref 8–23)
CO2: 24 mmol/L (ref 22–32)
Calcium: 8.6 mg/dL — ABNORMAL LOW (ref 8.9–10.3)
Chloride: 101 mmol/L (ref 98–111)
Creatinine, Ser: 0.9 mg/dL (ref 0.61–1.24)
GFR, Estimated: >60 mL/min (ref >60)
Glucose, Bld: 132 mg/dL — ABNORMAL HIGH (ref 70–99)
Potassium: 4 mmol/L (ref 3.5–5.1)
Sodium: 131 mmol/L — ABNORMAL LOW (ref 135–145)
Total Bilirubin: 0.5 mg/dL (ref 0.3–1.2)
Total Protein: 5.6 g/dL — ABNORMAL LOW (ref 6.5–8.1)

## 2020-08-24 MED ORDER — GABAPENTIN 100 MG PO CAPS
200.0000 mg | ORAL_CAPSULE | Freq: Three times a day (TID) | ORAL | Status: DC
  Administered 2020-08-24 – 2020-09-01 (×22): 200 mg via ORAL
  Filled 2020-08-24 (×22): qty 2

## 2020-08-24 MED ORDER — CALCIUM GLUCONATE-NACL 2-0.675 GM/100ML-% IV SOLN
INTRAVENOUS | Status: AC
  Filled 2020-08-24: qty 100

## 2020-08-24 MED ORDER — CALCIUM CARBONATE ANTACID 500 MG PO CHEW
2.0000 | CHEWABLE_TABLET | ORAL | Status: AC
  Administered 2020-08-24: 400 mg via ORAL

## 2020-08-24 MED ORDER — GABAPENTIN 300 MG PO CAPS: 300.0000 mg | ORAL_CAPSULE | Freq: Three times a day (TID) | ORAL | Status: DC

## 2020-08-24 MED ORDER — OXYCODONE HCL 5 MG PO TABS
ORAL_TABLET | ORAL | Status: AC
  Filled 2020-08-24: qty 1

## 2020-08-24 MED ORDER — ACD FORMULA A 0.73-2.45-2.2 GM/100ML VI SOLN
Status: AC
  Administered 2020-08-24: 1000 mL
  Filled 2020-08-24: qty 500

## 2020-08-24 MED ORDER — ACD FORMULA A 0.73-2.45-2.2 GM/100ML VI SOLN
1000.0000 mL | Status: DC
  Filled 2020-08-24: qty 1000

## 2020-08-24 MED ORDER — ACETAMINOPHEN 325 MG PO TABS: 650.0000 mg | ORAL_TABLET | ORAL | Status: DC | PRN

## 2020-08-24 MED ORDER — DIPHENHYDRAMINE HCL 25 MG PO CAPS: 25.0000 mg | ORAL_CAPSULE | Freq: Four times a day (QID) | ORAL | Status: DC | PRN

## 2020-08-24 MED ORDER — CALCIUM GLUCONATE-NACL 2-0.675 GM/100ML-% IV SOLN
2.0000 g | Freq: Once | INTRAVENOUS | Status: AC
  Administered 2020-08-24: 2000 mg via INTRAVENOUS
  Filled 2020-08-24 (×2): qty 100

## 2020-08-24 MED ORDER — HEPARIN SODIUM (PORCINE) 1000 UNIT/ML IJ SOLN
1000.0000 [IU] | Freq: Once | INTRAMUSCULAR | Status: AC
  Filled 2020-08-24: qty 1

## 2020-08-24 MED ORDER — SODIUM CHLORIDE 0.9 % IV SOLN
INTRAVENOUS | Status: AC
  Filled 2020-08-24 (×3): qty 200

## 2020-08-24 MED ORDER — CALCIUM CARBONATE ANTACID 500 MG PO CHEW
CHEWABLE_TABLET | ORAL | Status: AC
  Administered 2020-08-24: 400 mg via ORAL
  Filled 2020-08-24: qty 4

## 2020-08-24 MED ORDER — HEPARIN SODIUM (PORCINE) 1000 UNIT/ML IJ SOLN
INTRAMUSCULAR | Status: AC
  Administered 2020-08-24: 2600 [IU]
  Filled 2020-08-24: qty 4

## 2020-08-24 NOTE — Progress Notes (Signed)
NIF -30 with good patient effort. Pt resting comfortably sitting in bed.

## 2020-08-24 NOTE — Progress Notes (Signed)
PROGRESS NOTE    Jacob Cameron  YTW:[id number] DOB: [birth year] DOA: 08/12/2020 PCP: Patient, No Pcp Per    Brief Narrative:  65 y.o.malewithout medical history, s/p J&J covid vaccine April 2021 who presented with paresthesias in the right hand and feet with associated pain that has progressed over the previous few days now causing him to be unable to walk. MRI brain was nonacute, so he was admitted to [facility] for neurology evaluation on the advice of teleneurology with MRI C/T/L spine pending. Neurology has initiated IVIG empirically. Symptoms are stable and severely disabling. PT and OT recommend CIR at this time.He has undergone MRI on 2/6 with no acute findings. Refusing LP.  -He will finish his IVIG on 2/8 and then will need placement.  Also noted to be hyponatremic on 2/8 for which IV fluid and fluid restriction started with lab work ordered and pending.  Ongoing need for pain management.  Assessment & Plan:   Principal Problem:   Paresthesias Active Problems:   Tobacco abuse   Polyneuropathy, idiopathic progressive   Distal polyneuropathy: Unclear etiology. Vitamin B12 level wnl at 187. CK wnl. ESR 20, CRP 1.6.  - Neurology directing management:Completed IVIGx5 daysfor empiric Tx(ended 2/8) - B12 supplementation due to low-normal level. - Pt reports he's willing to try MRI. D/w RN, neuro, and reordered prn ativan to assist with anxiety/claustrophobia.  - Decision Re: LPrefused -Continuegabapentin low dose. - Therapy recommending CIR. Pt is currently severely debilitated by symptoms and would not be able to return home safely. PM&R consulted. -Per Neurology, pt is planned for 5 days of plasma exchange qOD dosing -Neurontin recently increased to 200mg  tid for better pain control. Pt reports improvement, but still has pain. 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 -Sodium trend had shown overall improvement  T2DM: HbA1c 7%, new diagnosis. Given lack of significant symptoms, do not believe this is likely to have been present long enough or severe enough to precipitate acute polyneuropathy. - Diabetes coordinator and dietitian consulted - Start metformin after we know contrasted exams will no longer be neededand after GI symptoms have resolved.  - continue with SSI coverage as needed  Neutrophilic leukocytosis: Afebrile. No meningismus on exam.WBC coming down. - Continue monitoring off antimicrobial therapy.Remains afebrile.ESR, CRP not indicative of severe inflammatory state.  Hypertension -Started amlodipine 5mg  daily on 2/7 -Pt noted to be hypotensive during plasma exchange. Cont to hold antihypertensive meds  Dyslipidemia: LDL 192. LFTs wnl.  - Continued onrosuvastatin  Tobacco use:  - Recommend tobacco cessation  Elevated TSH: Mildly elevated at 5.311. T3, T4 wnl.  - Would repeat TSH in 4-6 weeks  DVT prophylaxis: Lovenox subq Code Status: Full Family Communication: Pt in room, family currently not at bedside  Status is: Inpatient  Remains inpatient appropriate because:Unsafe d/c plan and IV treatments appropriate due to intensity of illness or inability to take PO   Dispo: The patient is from: Home              Anticipated d/c is to: CIR              Anticipated d/c date is: 3 days  Patient currently is not medically stable to d/c.   Difficult to place patient No   Consultants:   Neurology  IR  Procedures:     Antimicrobials: Anti-infectives (From admission, onward)   None      Subjective: Seen during plasma exchange. Reports feeling better. Still has some pain in LE  Objective: Vitals:    08/24/20 1343 08/24/20 1345 08/24/20 1352 08/24/20 1557  BP: 103/76  116/82 90/65  Pulse:  92 88 96  Resp:  (!) 24 (!) 24 18  Temp:   98.2 F (36.8 C) 98.3 F (36.8 C)  TempSrc:   Oral Oral  SpO2:  96% 98% 100%  Weight:      Height:        Intake/Output Summary (Last 24 hours) at 08/24/2020 1647 Last data filed at 08/23/2020 2122 Gross per 24 hour  Intake 120 ml  Output --  Net 120 ml   Filed Weights   08/12/20 2053 08/21/20 0500  Weight: 80.3 kg 80 kg    Examination: General exam: Awake, laying in bed, in nad Respiratory system: Normal respiratory effort, no wheezing Cardiovascular system: regular rate, s1, s2 Gastrointestinal system: Soft, nondistended, positive BS Central nervous system: CN2-12 grossly intact, strength intact Extremities: Perfused, no clubbing Skin: Normal skin turgor, no notable skin lesions seen Psychiatry: Mood normal // no visual hallucinations   Data Reviewed: I have personally reviewed following labs and imaging studies  CBC: Recent Labs  Lab 08/18/20 0105 08/20/20 0449 08/22/20 1023 08/22/20 1048 08/23/20 0400  WBC 11.9* 10.4  --  13.7* 15.6*  HGB 14.0 13.4 15.0 14.0 14.0  HCT 41.8 38.8* 44.0 41.2 40.9  MCV 82.0 81.7  --  83.4 82.5  PLT 362 346  --  371 614   Basic Metabolic Panel: Recent Labs  Lab 08/18/20 0105 08/19/20 0359 08/20/20 2123 08/21/20 0305 08/22/20 0556 08/22/20 1023 08/23/20 0400 08/24/20 0447  NA 124*   < > 127* 129* 131* 131* 132* 131*  K 4.0   < > 4.0 3.8 4.3 4.1 4.3 4.0  CL 92*   < > 95* 98 101 95* 102 101  CO2 22   < > $R'23 23 24  'eq$ --  22 24  GLUCOSE 134*   < > 132* 138* 131* 215* 135* 132*  BUN 17   < > $R'21 19 17 20 16 11  'Rw$ CREATININE 0.92   < > 1.10 0.96 1.06 0.90 0.98 0.90  CALCIUM 8.4*   < > 8.6* 8.6* 8.8*  --  8.8* 8.6*  MG 2.0  --   --   --   --   --   --   --    < > = values in this interval not displayed.   GFR: Estimated Creatinine Clearance: 81.9 mL/min (by C-G formula based on SCr of 0.9  mg/dL). Liver Function Tests: Recent Labs  Lab 08/20/20 0449 08/21/20 0305 08/22/20 0556 08/23/20 0400 08/24/20 0447  AST 45* 32 $Remo'20 15 19  'uNYjG$ ALT 64* 57* 32 20 24  ALKPHOS 69 77 43 29* 36*  BILITOT 0.7 0.7 0.7 0.6 0.5  PROT 7.6 8.1 6.4* 5.8* 5.6*  ALBUMIN 2.3* 2.4* 3.5 4.0 3.4*   No results for input(s): LIPASE, AMYLASE in the last 168 hours. No results for input(s): AMMONIA in the last 168 hours. Coagulation Profile: No results for input(s): INR, PROTIME in the last 168 hours. Cardiac Enzymes: No results for input(s): CKTOTAL, CKMB, CKMBINDEX, TROPONINI in the last 168 hours. BNP (  last 3 results) No results for input(s): PROBNP in the last 8760 hours. HbA1C: No results for input(s): HGBA1C in the last 72 hours. CBG: Recent Labs  Lab 08/23/20 1713 08/23/20 2136 08/24/20 0640 08/24/20 1135 08/24/20 1559  GLUCAP 120* 140* 173* 111* 225*   Lipid Profile: No results for input(s): CHOL, HDL, LDLCALC, TRIG, CHOLHDL, LDLDIRECT in the last 72 hours. Thyroid Function Tests: No results for input(s): TSH, T4TOTAL, FREET4, T3FREE, THYROIDAB in the last 72 hours. Anemia Panel: No results for input(s): VITAMINB12, FOLATE, FERRITIN, TIBC, IRON, RETICCTPCT in the last 72 hours. Sepsis Labs: No results for input(s): PROCALCITON, LATICACIDVEN in the last 168 hours.  No results found for this or any previous visit (from the past 240 hour(s)).   Radiology Studies: No results found.  Scheduled Meds: . Chlorhexidine Gluconate Cloth  6 each Topical Daily  . cyanocobalamin  1,000 mcg Subcutaneous Daily  . cyanocobalamin  1,000 mcg Intramuscular Q7 days  . enoxaparin (LOVENOX) injection  40 mg Subcutaneous Q24H  . gabapentin  300 mg Oral TID  . insulin aspart  0-6 Units Subcutaneous TID WC  . oxyCODONE      . rosuvastatin  5 mg Oral Daily   Continuous Infusions:    LOS: 10 days   Marylu Lund, MD [facility] Pager On Amion  If 7PM-7AM, please contact  night-coverage 08/24/2020, 4:47 PM

## 2020-08-24 NOTE — Progress Notes (Signed)
Inpatient Rehab Admissions Coordinator:   Plasma exchange scheduled every other day with the final day on 2/19.  Will follow up later this week to assess rehab needs.   Estill Dooms, PT, DPT Admissions Coordinator [phone] 08/24/20  11:50 AM

## 2020-08-24 NOTE — Progress Notes (Signed)
PT Cancellation Note  Patient Details Name: Jacob Cameron MRN: [id number] DOB: [birth year]   Cancelled Treatment:    Reason Eval/Treat Not Completed: Patient at procedure or test/unavailable.  Pt in HD at this time. Will follow up as time allows today vs another date. Acute PT to continue.  Sallyanne Kuster, PTA, CLT Acute Rehab Services [phone] 08/24/20, 12:36 PM   Sallyanne Kuster 08/24/2020, 12:36 PM

## 2020-08-24 NOTE — Progress Notes (Signed)
Neurology Progress Note Interval History: Has received PLEX x 3 thus far. No acute events in past 24 hours.   Subjective:  He reports feeling the same, no better or worse. He continues to complain of right UE and bilateral  LE tingling like pins and needles. He denies worsening of subjective weakness. We discussed plan of care for further PLEX treatments and likely CIR for further recovery. He states understanding of plan of care.   Objective: Current vital signs: BP 113/82   Pulse 92   Temp 98 F (36.7 C) (Oral)   Resp 18   Ht 5\' 7"  (1.702 m)   Wt 80 kg   SpO2 97%   BMI 27.63 kg/m  Vital signs in last 24 hours: Temp:  [97.6 F (36.4 C)-98.4 F (36.9 C)] 98 F (36.7 C) (02/14 1129) Pulse Rate:  [85-97] 92 (02/14 1129) Resp:  [16-20] 18 (02/14 1129) BP: (91-129)/(61-86) 113/82 (02/14 1129) SpO2:  [96 %-98 %] 97 % (02/14 1129)  Physical exam GENERAL: Awake, lying comfortably in bed,  in NAD PSYCH: Normal mood and affect. HEENT: Normocephalic and atraumatic, dry mm LUNGS: Normal respiratory effort.  CV: RRR to radial pulse.  Ext: Warm and dry   NEURO:  Mental Status: Alert, awake, oriented to time place and person.Speech is fluent.  Naming, repetition, fluency, and comprehension intact. Cranial Nerves:  II: PERRL. Visual fields full.  III, IV, VI: EOMI. Eyelids elevate symmetrically.  V: Sensation is intact to light touch and symmetrical to face.  VII: Smile is symmetrical. Able to puff cheeks and raise eyebrows.  VIII: Hearing intact to voice. IX, X: Palate elevates symmetrically. Phonation is normal.  XI: Shoulder shrug 5/5. XII: TML Motor: RUE 4/5 biceps, 4-/5 triceps, grip 4/5. LUE 4+/5 throughout. RLE 4-/5 dorsal and plantar flexion. Abduction/adduction RLE 4-/5. Knee RLE 4+/5. LLE 4/5 abductor/adductor. 4/5 knee. 4/5 plantar flexion and 4/5 dorsiflexion.  Tone: is normal and bulk is normal Sensation- Intact to light touch bilaterally in all extremities. RUE is  decreased to elbow, but worse in stocking/glove presentation. LUE intact. RLE decreased above knee. Temperature is decreased foot to mid shin. LLE same as RLE.    Coordination: FTN and HKS intact bilaterally, No drift.  DTRs: RUE 1+ biceps and brachioradialis. 0 triceps. LUE 1+ throughout. RLE 0. LLE 0.  Plantars: Toes down going bilaterally  Gait- Deferred  Medications  Current Facility-Administered Medications:  .  acetaminophen (TYLENOL) tablet 650 mg, 650 mg, Oral, Q4H PRN, 650 mg at 08/23/20 1755 **OR** acetaminophen (TYLENOL) 160 MG/5ML solution 650 mg, 650 mg, Per Tube, Q4H PRN **OR** acetaminophen (TYLENOL) suppository 650 mg, 650 mg, Rectal, Q4H PRN, 08/25/20, MD .  albumin human 25 % 50 g in sodium chloride 0.9 %, , Dialysis, Q1 Hr x 3, Lindzen, Eric, MD .  benzonatate (TESSALON) capsule 100 mg, 100 mg, Oral, TID PRN, Jonah Blue, MD, 100 mg at 08/22/20 1741 .  calcium carbonate (TUMS - dosed in mg elemental calcium) chewable tablet 400 mg of elemental calcium, 2 tablet, Oral, Q3H, 10/20/20, MD .  calcium gluconate 2 g/ 100 mL sodium chloride IVPB, 2 g, Intravenous, Once, Caryl Pina, MD .  calcium gluconate 2-0.675 GM/100ML-% IVPB, , , ,  .  Chlorhexidine Gluconate Cloth 2 % PADS 6 each, 6 each, Topical, Daily, 01-05-1990, MD, 6 each at 08/24/20 0809 .  citrate dextrose (ACD-A anticoagulant) 0.73-2.45-2.2 GM/100ML solution, , , ,  .  citrate dextrose (ACD-A anticoagulant) solution 1,000  mL, 1,000 mL, Other, Continuous, Caryl Pina, MD .  cyanocobalamin ((VITAMIN B-12)) injection 1,000 mcg, 1,000 mcg, Subcutaneous, Daily, Caryl Pina, MD, 1,000 mcg at 08/23/20 1024 .  cyanocobalamin ((VITAMIN B-12)) injection 1,000 mcg, 1,000 mcg, Intramuscular, Q7 days, Caryl Pina, MD, 1,000 mcg at 08/24/20 0809 .  diphenhydrAMINE (BENADRYL) capsule 25 mg, 25 mg, Oral, Q6H PRN, Caryl Pina, MD .  enoxaparin (LOVENOX) injection 40 mg, 40 mg, Subcutaneous, Q24H, Jonah Blue, MD, 40 mg at 08/23/20 1707 .  gabapentin (NEURONTIN) capsule 200 mg, 200 mg, Oral, TID, Jerald Kief, MD, 200 mg at 08/24/20 0809 .  heparin sodium (porcine) injection 1,000 Units, 1,000 Units, Intracatheter, Once, Caryl Pina, MD .  hydrALAZINE (APRESOLINE) injection 5 mg, 5 mg, Intravenous, Q4H PRN, Zhi Geier Giovanni, MD .  HYDROmorphone (DILAUDID) injection 1 mg, 1 mg, Intravenous, Q3H PRN, Sherryll Burger, Pratik D, DO .  insulin aspart (novoLOG) injection 0-6 Units, 0-6 Units, Subcutaneous, TID WC, Tyrone Nine, MD, 1 Units at 08/24/20 [phone] .  lidocaine (PF) (XYLOCAINE) 1 % injection, , , PRN, Mir, Al Corpus, MD, 5 mL at 08/19/20 1549 .  ondansetron (ZOFRAN) injection 4 mg, 4 mg, Intravenous, Q6H PRN, Jonah Blue, MD, 4 mg at 08/19/20 1730 .  oxyCODONE (Oxy IR/ROXICODONE) immediate release tablet 5 mg, 5 mg, Oral, Q4H PRN, Tyrone Nine, MD, 5 mg at 08/23/20 0429 .  rosuvastatin (CRESTOR) tablet 5 mg, 5 mg, Oral, Daily, Hazeline Junker B, MD, 5 mg at 08/24/20 0810 .  senna-docusate (Senokot-S) tablet 1 tablet, 1 tablet, Oral, QHS PRN, Jonah Blue, MD, 1 tablet at 08/17/20 0529 .  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  -Symptoms/signs are stable. Diffuse weakness, BLE areflexia, upper extremity asymmetric hyporeflexia and sensory loss in multiple modalities exhibiting a stocking glove distribution (sensory loss sparing the left arm, however).  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  - Hyponatremia noted on lab work with Na at 129. 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 Patient  euvolemic on fluid restriction today.  - PT suggests [place] [facility] ([facility]). - Negative inspiratory force (NIF), -30 today with good effort. -Patient has refused LP for further supporting diagnosticinformation regarding probableGBS.  -MRI brain without acute intracranial abnormality. MRI of cervical, thoracic and lumbar spine without evidence for a myelopathy.   Recommendations:  -      Plasma exchange therapy (completed 3/5 planned treatments.  -      Continue B12 supplementation.  -Neurontin per primary team for symptom management -      Na 131 today (126 on admission). Continue to correct hyponatremia as per primary team.  -      Continue to educate the patient regarding GBS, which is the most likely etiology for his presentation. -      Agree with [place] [facility] ([facility])  Delila A Bailey-Modzik, NP-C

## 2020-08-25 DIAGNOSIS — M792 Neuralgia and neuritis, unspecified: Secondary | ICD-10-CM | POA: Diagnosis not present

## 2020-08-25 DIAGNOSIS — G603 Idiopathic progressive neuropathy: Secondary | ICD-10-CM | POA: Diagnosis not present

## 2020-08-25 DIAGNOSIS — R202 Paresthesia of skin: Secondary | ICD-10-CM | POA: Diagnosis not present

## 2020-08-25 LAB — COMPREHENSIVE METABOLIC PANEL
ALT: 22 U/L (ref 0–44)
AST: 19 U/L (ref 15–41)
Albumin: 3.9 g/dL (ref 3.5–5.0)
Alkaline Phosphatase: 23 U/L — ABNORMAL LOW (ref 38–126)
Anion gap: 6 (ref 5–15)
BUN: 10 mg/dL (ref 8–23)
CO2: 23 mmol/L (ref 22–32)
Calcium: 8.9 mg/dL (ref 8.9–10.3)
Chloride: 103 mmol/L (ref 98–111)
Creatinine, Ser: 0.88 mg/dL (ref 0.61–1.24)
GFR, Estimated: >60 mL/min (ref >60)
Glucose, Bld: 126 mg/dL — ABNORMAL HIGH (ref 70–99)
Potassium: 4.2 mmol/L (ref 3.5–5.1)
Sodium: 132 mmol/L — ABNORMAL LOW (ref 135–145)
Total Bilirubin: 0.2 mg/dL — ABNORMAL LOW (ref 0.3–1.2)
Total Protein: 5.5 g/dL — ABNORMAL LOW (ref 6.5–8.1)

## 2020-08-25 LAB — CBC
HCT: 40.8 % (ref 39.0–52.0)
Hemoglobin: 13.4 g/dL (ref 13.0–17.0)
MCH: 27.7 pg (ref 26.0–34.0)
MCHC: 32.8 g/dL (ref 30.0–36.0)
MCV: 84.3 fL (ref 80.0–100.0)
Platelets: 324 10*3/uL (ref 150–400)
RBC: 4.84 MIL/uL (ref 4.22–5.81)
RDW: 13.1 % (ref 11.5–15.5)
WBC: 15 10*3/uL — ABNORMAL HIGH (ref 4.0–10.5)
nRBC: 0 % (ref 0.0–0.2)

## 2020-08-25 LAB — GLUCOSE, CAPILLARY
Glucose-Capillary: 130 mg/dL — ABNORMAL HIGH (ref 70–99)
Glucose-Capillary: 116 mg/dL — ABNORMAL HIGH (ref 70–99)
Glucose-Capillary: 207 mg/dL — ABNORMAL HIGH (ref 70–99)
Glucose-Capillary: 100 mg/dL — ABNORMAL HIGH (ref 70–99)

## 2020-08-25 NOTE — Progress Notes (Signed)
Neurology Progress Note  CC: GBS   Interval History: Jacob Cameron is a 65 y.o. male who is now day 12 of hospitalization. He reports about 3 weeks of progressive LE > UE weakness and paresthesias. There are pins and needle sensations up to about the knees and into the hands/forearms. He has examination findings suggestive of GBS, but confirmatory CSF has not been obtained (patient declined). Also, PNCV/EMG not available in-house. An MRI series through the entire neuraxis failed to reveal alternative etiologies. CRP and ESR minimally elevated and B12 slightly low (replaced). HIV, CK, TSH, and other tests unrevealing. He denies any substantial improvement after receiving full course of IVIG and now 3 PLEX treatments. Next PLEX planned for tomorrow.   ROS: A 14 point ROS was performed and is negative except as noted in the HPI. Denies dysphagia, diplopia, variable fatigue, ptosis, dysarthria, neck/shoulder weakness.  History reviewed. No pertinent past medical history.  History reviewed. No pertinent family history.  Social History:  reports that he has been smoking cigarettes. He has a 2.00 pack-year smoking history. He has never used smokeless tobacco. He reports that he does not drink alcohol and does not use drugs.  Exam: Current vital signs: BP 107/85 (BP Location: Left Arm)   Pulse 92   Temp 98.1 F (36.7 C) (Oral)   Resp 18   Ht 5' 7" (1.702 m)   Wt 80 kg   SpO2 98%   BMI 27.63 kg/m  Vital signs in last 24 hours: Temp:  [98 F (36.7 C)-98.7 F (37.1 C)] 98.1 F (36.7 C) (02/15 0802) Pulse Rate:  [87-96] 92 (02/15 0802) Resp:  [16-24] 18 (02/15 0802) BP: (90-143)/(62-91) 107/85 (02/15 0802) SpO2:  [94 %-100 %] 98 % (02/15 0802)   Physical Exam  Constitutional: Appears well-developed and well-nourished.  Psych: Affect appropriate to situation Eyes: No scleral injection HENT: No OP obstrucion MSK: no joint deformities.  Cardiovascular: Normal rate and regular rhythm.   Respiratory: Effort normal, non-labored breathing GI: Soft.  No distension. There is no tenderness.  Skin: WDI  Neuro: Mental Status: Patient is awake, alert, oriented to person, place, month, year, and situation Patient is able to give a clear and coherent history No signs of aphasia or neglect Cranial Nerves: II: Visual Fields not testedl. Pupils are equal, round, and reactive to light.  III,IV, VI: EOMI without ptosis or diploplia.  V: Facial sensation is symmetric to temperature VII: Facial movement is symmetric.  VIII: hearing is intact to voice X: deferred XI: Shoulder shrug is symmetric. XII: tongue is midline without atrophy or fasciculations.  Motor: Tone is normal. Bulk is normal.4/5 wrist flexion/extension, and hand grip strength ~50% expected. In lower extremities, I can easily overcome hip flexors, dorsiflexors. Knees bend when he elevates the legs against gravity several inches off the bed. (4-/5 diffusely, symmetrically in both legs). Sensory: Sensation is symmetrically reduced to above the knees to temperature and severely reduced throughout to vibratory stimuli. UEs are slightly less affected with deficits to mid-forearm. Deep Tendon Reflexes: 0/4 and symmetric in the biceps, brachioradialis, triceps, patellae, and Achilles. Plantar responses are mute. Cerebellar: deferred Gait: Not attempted  Labs: I have reviewed labs in epic and the results pertinent to this consultation are: As noted above. Low B12 has been replaced.  Imaging: I have reviewed the images obtained.  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 Unable to confirm diagnosis due to lack of CSF and PNCV/EMG.  Recommendations: - complete course of PLEX -  will need IPR to regain function/mobility - continue periodic monitoring of respiratory status, though stable at this time with NIF -40 and no respiratory complaints - I would recommend holding off on any further  treatment following completion of PLEX series. - will follow along loosely. Reach out for additional guidance prn.  [name]

## 2020-08-25 NOTE — Progress Notes (Signed)
Physical Therapy Treatment Patient Details Name: Jacob Cameron MRN: [id number] DOB: October 05, 1955 Today's Date: 08/25/2020    History of Present Illness Pt is a 65 y.o. male presenting with R neck pain and radiculopathy down his R arm with bil leg pain, parasthesias, and weakness. Symptoms began in legs then arm then neck. MRI showing no acute change in head. Symptoms/exam consistent with Alene Mires however unable to confirm diagnosis due to lack of CSF and PNCV/EMG.    PT Comments    Patient progressing slowly towards PT goals. Tolerated gait training today with Min A of 2 and use of RW for safety. Pt noted to have bil knee instability but no buckling today. Requires Mod A initially to stand progressing to Min A and min guard assist with cues for proper technique and hand placement. Pt with difficulty standing at sink performing ADLs requiring mod A for support due to posterior lean and requiring 1 UE for support on RW. Continues to report tingling/pins/needles in BLEs and hands. No evidence of dizziness today with mobility. Plan for another PLEX treatment 2/16. Will follow.   Follow Up Recommendations  CIR;Supervision for mobility/OOB     Equipment Recommendations  Rolling walker with 5" wheels;3in1 (PT);Wheelchair (measurements PT);Wheelchair cushion (measurements PT)    Recommendations for Other Services       Precautions / Restrictions Precautions Precautions: Fall Precaution Comments: Translator helpful but pt speaks pretty good English Restrictions Weight Bearing Restrictions: No    Mobility  Bed Mobility Overal bed mobility: Needs Assistance Bed Mobility: Supine to Sit     Supine to sit: HOB elevated;Supervision     General bed mobility comments: Supervision for safety with extra time for all and use of rail.    Transfers Overall transfer level: Needs assistance Equipment used: Rolling walker (2 wheeled) Transfers: Sit to/from Stand Sit to Stand: Mod assist;Min  assist;+2 safety/equipment;Min guard         General transfer comment: Assist to power to standing with cues for hand placement as pt wanting to pull up on RW. Difficulty in mid range due to quad weakness. Mod A progressing to Min guard assist after consecutive repetitions and proper hand placement. Stood from [employer], from chair x8.  Ambulation/Gait Ambulation/Gait assistance: Min assist;+2 safety/equipment Gait Distance (Feet): 8 Feet (+ 12') Assistive device: Rolling walker (2 wheeled) Gait Pattern/deviations: Decreased step length - right;Decreased weight shift to right;Decreased weight shift to left;Trunk flexed;Step-through pattern;Step-to pattern Gait velocity: reduced Gait velocity interpretation: <1.8 ft/sec, indicate of risk for recurrent falls General Gait Details: Slow, mildly unsteady gait with bil knee instability and increased WB through BUEs. Fatigues needing seated rest break.   Stairs             Wheelchair Mobility    Modified Rankin (Stroke Patients Only) Modified Rankin (Stroke Patients Only) Pre-Morbid Rankin Score: No symptoms Modified Rankin: Moderately severe disability     Balance Overall balance assessment: Needs assistance Sitting-balance support: Feet supported;No upper extremity supported Sitting balance-Leahy Scale: Fair Sitting balance - Comments: able to maintain static sitting without UE support   Standing balance support: During functional activity Standing balance-Leahy Scale: Poor Standing balance comment: Requires Mod A at sink when performing ADL task due to posterior lean and to the right. performed marching in standing with Min A and BUE support on RW.                            Cognition Arousal/Alertness:  Awake/alert Behavior During Therapy: WFL for tasks assessed/performed Overall Cognitive Status: Within Functional Limits for tasks assessed                                 General Comments: appears  [facility] for basic mobility tasks. Interpreter used [employer] [phone]      Exercises Other Exercises Other Exercises: Serial sit to stands from chair with Min A progressing to Min guard assist with cues for hand placement/technique and emphasis on slow descent.    General Comments General comments (skin integrity, edema, etc.): Worked on manipulating objects and fine motor skills- opening/closing items, writing, etc.      Pertinent Vitals/Pain Pain Assessment: Faces Faces Pain Scale: Hurts little more Pain Location: bil legs Pain Descriptors / Indicators: Pins and needles Pain Intervention(s): Monitored during session    Home Living                      Prior Function            PT Goals (current goals can now be found in the care plan section) Progress towards PT goals: Progressing toward goals    Frequency    Min 3X/week      PT Plan Current plan remains appropriate    Co-evaluation PT/OT/SLP Co-Evaluation/Treatment: Yes Reason for Co-Treatment: Necessary to address cognition/behavior during functional activity;For patient/therapist safety;To address functional/ADL transfers PT goals addressed during session: Mobility/safety with mobility;Balance;Proper use of DME;Strengthening/ROM        AM-PAC PT "6 Clicks" Mobility   Outcome Measure  Help needed turning from your back to your side while in a flat bed without using bedrails?: None Help needed moving from lying on your back to sitting on the side of a flat bed without using bedrails?: None Help needed moving to and from a bed to a chair (including a wheelchair)?: A Little Help needed standing up from a chair using your arms (e.g., wheelchair or bedside chair)?: A Little Help needed to walk in hospital room?: A Lot Help needed climbing 3-5 steps with a railing? : A Lot 6 Click Score: 18    End of Session Equipment Utilized During Treatment: Gait belt Activity Tolerance: Patient tolerated treatment  well Patient left: in chair;with call bell/phone within reach;Other (comment) (with OT present in room) Nurse Communication: Mobility status PT Visit Diagnosis: Unsteadiness on feet (R26.81);Other abnormalities of gait and mobility (R26.89);Muscle weakness (generalized) (M62.81);Difficulty in walking, not elsewhere classified (R26.2);Other symptoms and signs involving the nervous system (R29.898)     Time: 1305-1330 PT Time Calculation (min) (ACUTE ONLY): 25 min  Charges:  $Gait Training: 8-22 mins                     Vale Haven, PT, DPT Acute Rehabilitation Services Pager [phone] Office [phone]       Blake Divine A Lanier Ensign 08/25/2020, 2:50 PM

## 2020-08-25 NOTE — Progress Notes (Signed)
NIF > -40 With great effort.

## 2020-08-25 NOTE — Plan of Care (Signed)
?  Problem: Clinical Measurements: ?Goal: Ability to maintain clinical measurements within normal limits will improve ?Outcome: Progressing ?Goal: Will remain free from infection ?Outcome: Progressing ?Goal: Diagnostic test results will improve ?Outcome: Progressing ?Goal: Respiratory complications will improve ?Outcome: Progressing ?Goal: Cardiovascular complication will be avoided ?Outcome: Progressing ?  ?Problem: Nutrition: ?Goal: Adequate nutrition will be maintained ?Outcome: Progressing ?  ?Problem: Activity: ?Goal: Risk for activity intolerance will decrease ?Outcome: Progressing ?  ?Problem: Coping: ?Goal: Level of anxiety will decrease ?Outcome: Progressing ?  ?Problem: Elimination: ?Goal: Will not experience complications related to bowel motility ?Outcome: Progressing ?Goal: Will not experience complications related to urinary retention ?Outcome: Progressing ?  ?Problem: Pain Managment: ?Goal: General experience of comfort will improve ?Outcome: Progressing ?  ?Problem: Safety: ?Goal: Ability to remain free from injury will improve ?Outcome: Progressing ?  ?Problem: Skin Integrity: ?Goal: Risk for impaired skin integrity will decrease ?Outcome: Progressing ?  ?

## 2020-08-25 NOTE — Progress Notes (Signed)
PROGRESS NOTE    Jacob Cameron  MRN:[id number] DOB: 11/18/1955 DOA: 08/12/2020 PCP: Patient, No Pcp Per    Brief Narrative:  64 y.o.malewithout medical history, s/p J&J covid vaccine April 2021 who presented with paresthesias in the right hand and feet with associated pain that has progressed over the previous few days now causing him to be unable to walk. MRI brain was nonacute, so he was admitted to [facility] for neurology evaluation on the advice of teleneurology with MRI C/T/L spine pending. Neurology has initiated IVIG empirically. Symptoms are stable and severely disabling. PT and OT recommend CIR at this time.He has undergone MRI on 2/6 with no acute findings. Refusing LP.  -He will finish his IVIG on 2/8 and then will need placement.  Also noted to be hyponatremic on 2/8 for which IV fluid and fluid restriction started with lab work ordered and pending.  Ongoing need for pain management.  Assessment & Plan:   Principal Problem:   Paresthesias Active Problems:   Tobacco abuse   Polyneuropathy, idiopathic progressive   Distal polyneuropathy: Unclear etiology. Vitamin B12 level wnl at 187. CK wnl. ESR 20, CRP 1.6.  - Neurology directing management:Completed IVIGx5 daysfor empiric Tx(ended 2/8) - B12 supplementation due to low-normal level. - Pt reports he's willing to try MRI. D/w RN, neuro, and reordered prn ativan to assist with anxiety/claustrophobia.  - Decision Re: LPrefused - Therapy recommending CIR. Pt is currently severely debilitated by symptoms and would not be able to return home safely. PM&R consulted. -Per Neurology, pt is planned for total 5 days of plasma exchange qOD dosing, thus far has received 3 treatments, last treatment was 2/14 -Neurontin recently increased to 200mg tid for better pain control.   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 Given lack of significant symptoms, do not believe this is likely to have been present long enough or severe enough to precipitate acute polyneuropathy. - Diabetes coordinator and dietitian consulted - Start metformin after we know contrasted exams will no longer be neededand after GI symptoms have resolved.  - continue with SSI coverage as needed  Neutrophilic leukocytosis: Afebrile. No meningismus on exam.WBC coming down. - Continue monitoring off antimicrobial therapy.Remains afebrile.ESR, CRP not indicative of severe inflammatory state.  Hypertension -Started amlodipine 5mg daily on 2/7 -Pt noted to be hypotensive during plasma exchange.  -Will cont to hold antihypertensive for now  Dyslipidemia: LDL 192. LFTs wnl.  - Continued onrosuvastatin as tolerated  Tobacco use:  - Recommend tobacco cessation  Elevated TSH: Mildly elevated at 5.311. T3, T4 wnl.  - Would repeat TSH in 4-6 weeks  DVT prophylaxis: Lovenox subq Code Status: Full Family Communication: Pt in room, family currently not at bedside  Status is: Inpatient  Remains inpatient appropriate because:Unsafe d/c plan and IV treatments appropriate due to intensity of illness or inability to take PO   Dispo: The patient is from: Home              Anticipated d/c is to: CIR              Anticipated d/c date is: 3 days                Patient currently is not medically stable to d/c.   Difficult to place patient No   Consultants:   Neurology  IR  Procedures:     Antimicrobials: Anti-infectives (From admission, onward)   None      Subjective: States feeling better today. States LE strength seems somewhat improved  Objective: Vitals:   08/24/20 2308 08/25/20 0351 08/25/20 0802  08/25/20 1153  BP: 104/76 106/71 107/85 124/84  Pulse: 92 89 92 95  Resp: _0 Temp: 98.6 F (37 C) 98.7 F (37.1 C) 98.1 F (36.7 C) 98 F (36.7 C)  TempSrc:  Axillary Oral Oral  SpO2: 96% 96% 98% 99%  Weight:      Height:        Intake/Output Summary (Last 24 hours) at 08/25/2020 1417 Last data filed at 08/25/2020 0555 Gross per 24 hour  Intake 360 ml  Output 425 ml  Net -65 ml   Filed Weights   08/12/20 2053 08/21/20 0500  Weight: 80.3 kg 80 kg    Examination: General exam: Conversant, in no acute distress Respiratory system: normal chest rise, clear, no audible wheezing Cardiovascular system: regular rhythm, s1-s2 Gastrointestinal system: Nondistended, nontender, pos BS Central nervous system: No seizures, no tremors Extremities: No cyanosis, no joint deformities Skin: No rashes, no pallor Psychiatry: Affect normal // no auditory hallucinations   Data Reviewed: I have personally reviewed following labs and imaging studies  CBC: Recent Labs  Lab 08/20/20 0449 08/22/20 1023 08/22/20 1048 08/23/20 0400 08/25/20 0408  WBC 10.4  --  13.7* 15.6* 15.0*  HGB 13.4 15.0 14.0 14.0 13.4  HCT 38.8* 44.0 41.2 40.9 40.8  MCV 81.7  --  83.4 82.5 84.3  PLT 346  --  371 353 638   Basic Metabolic Panel: Recent Labs  Lab 08/21/20 0305 08/22/20 0556 08/22/20 1023 08/23/20 0400 08/24/20 0447 08/25/20 0408  NA 129* 131* 131* 132* 131* 132*  K 3.8 4.3 4.1 4.3 4.0 4.2  CL 98 101 95* 102 101 103  CO2 23 24  --  _1 GLUCOSE 138* 131* 215* 135* 132* 126*  BUN _2 CREATININE 0.96 1.06 0.90 0.98 0.90 0.88  CALCIUM 8.6* 8.8*  --  8.8* 8.6* 8.9   GFR: Estimated Creatinine Clearance: 83.7 mL/min (by C-G formula based on SCr of 0.88 mg/dL). Liver Function Tests: Recent Labs  Lab 08/21/20 0305 08/22/20 0556 08/23/20 0400 08/24/20 0447 08/25/20 0408  AST 32 _3 ALT 57* 32 _4 ALKPHOS 77 43 29* 36* 23*  BILITOT 0.7 0.7 0.6  0.5 0.2*  PROT 8.1 6.4* 5.8* 5.6* 5.5*  ALBUMIN 2.4* 3.5 4.0 3.4* 3.9   No results for input(s): LIPASE, AMYLASE in the last 168 hours. No results for input(s): AMMONIA in the last 168 hours. Coagulation Profile: No results for input(s): INR, PROTIME in the last 168 hours. Cardiac Enzymes: No results for input(s): CKTOTAL, CKMB, CKMBINDEX, TROPONINI in the last 168 hours. BNP (last 3 results) No results for input(s): PROBNP in the last 8760 hours. HbA1C: No results for input(s): HGBA1C in the last 72 hours. CBG: Recent Labs  Lab 08/24/20 1135 08/24/20 1559 08/24/20 2028 08/25/20 0626 08/25/20 1155  GLUCAP 111* 225* 216* 130* 116*   Lipid Profile: No results for input(s): CHOL, HDL, LDLCALC, TRIG, CHOLHDL, LDLDIRECT in the last 72 hours. Thyroid Function Tests: No results for input(s): TSH, T4TOTAL, FREET4, T3FREE, THYROIDAB in the last  72 hours. Anemia Panel: No results for input(s): VITAMINB12, FOLATE, FERRITIN, TIBC, IRON, RETICCTPCT in the last 72 hours. Sepsis Labs: No results for input(s): PROCALCITON, LATICACIDVEN in the last 168 hours.  No results found for this or any previous visit (from the past 240 hour(s)).   Radiology Studies: No results found.  Scheduled Meds: . Chlorhexidine Gluconate Cloth  6 each Topical Daily  . cyanocobalamin  1,000 mcg Intramuscular Q7 days  . enoxaparin (LOVENOX) injection  40 mg Subcutaneous Q24H  . gabapentin  200 mg Oral TID  . insulin aspart  0-6 Units Subcutaneous TID WC  . rosuvastatin  5 mg Oral Daily   Continuous Infusions:    LOS: 11 days   Marylu Lund, MD [facility] Pager On Amion  If 7PM-7AM, please contact night-coverage 08/25/2020, 2:17 PM

## 2020-08-25 NOTE — Progress Notes (Signed)
Occupational Therapy Treatment Patient Details Name: Jacob Cameron MRN: [id number] DOB: 30-Jul-1955 Today's Date: 08/25/2020    History of present illness Pt is a 65 y.o. male presenting with R neck pain and radiculopathy down his R arm with bil leg pain, parasthesias, and weakness. Symptoms began in legs then arm then neck. MRI showing no acute change in head. Symptoms/exam consistent with Alene Mires however unable to confirm diagnosis due to lack of CSF and PNCV/EMG.   OT comments  Pt seen in conjunction with PT to maximize pts activity tolerance and optimize pts participation. Pt continues to present with impaired motor planning, BUE/ BLE weakness, and decreased FMC. Pt able to complete household distance functional mobility with RW and min A +2. Pt completed standing grooming tasks at sink with up to [facility] for standing balance, pt required at least one UE supported on sink to complete ADLs therefore bilateral integration tasks at sink required increased assist. Issued pt written HEP for level 1 theraputty with pt able to return demonstrate all directions with good carryover, recommend pt completed 3x daily to increase strength and FMC in bilateral hands. Pt would continue to benefit from skilled occupational therapy while admitted and after d/c to address the below listed limitations in order to improve overall functional mobility and facilitate independence with BADL participation. DC plan remains appropriate, will follow acutely per POC.    Follow Up Recommendations  CIR    Equipment Recommendations  3 in 1 bedside commode    Recommendations for Other Services      Precautions / Restrictions Precautions Precautions: Fall Precaution Comments: Translator helpful but pt speaks pretty good English Restrictions Weight Bearing Restrictions: No       Mobility Bed Mobility Overal bed mobility: Needs Assistance Bed Mobility: Supine to Sit     Supine to sit: HOB elevated;Supervision      General bed mobility comments: Supervision for safety with extra time for all and use of rail.  Transfers Overall transfer level: Needs assistance Equipment used: Rolling walker (2 wheeled) Transfers: Sit to/from Stand Sit to Stand: Mod assist;Min assist;+2 safety/equipment;Min guard         General transfer comment: Assist to power to standing with cues for hand placement as pt wanting to pull up on RW. Difficulty in mid range due to quad weakness. Mod A progressing to Min guard assist after consecutive repetitions and proper hand placement. Stood from [employer], from chair x8.    Balance Overall balance assessment: Needs assistance Sitting-balance support: Feet supported;No upper extremity supported Sitting balance-Leahy Scale: Fair Sitting balance - Comments: able to maintain static sitting without UE support   Standing balance support: During functional activity Standing balance-Leahy Scale: Poor Standing balance comment: Requires Mod A at sink when performing ADL task due to posterior lean and to the right. performed marching in standing with Min A and BUE support on RW.                           ADL either performed or assessed with clinical judgement   ADL Overall ADL's : Needs assistance/impaired     Grooming: Wash/dry hands;Standing Grooming Details (indicate cue type and reason): pt able to stand at sink with MOD A for balance with pt needing at least one UE supported during standing task, needing to sit down in between in order to wash both hands             Lower Body Dressing:  Maximal assistance;Sit to/from stand Lower Body Dressing Details (indicate cue type and reason): pts pants kept falling down during ADLs, MAX A needing to keep pants at waist line as pt requried BUE support during mobility Toilet Transfer: RW;Ambulation;Minimal assistance;+2 for physical assistance Toilet Transfer Details (indicate cue type and reason): simulated via functional  mobility with RW         Functional mobility during ADLs: Minimal assistance;+2 for physical assistance;Rolling walker General ADL Comments: pt continues to present with impaired motor planning, and BUE/ BLE weakness     Vision       Perception     Praxis      Cognition Arousal/Alertness: Awake/alert Behavior During Therapy: WFL for tasks assessed/performed Overall Cognitive Status: Within Functional Limits for tasks assessed                                 General Comments: appears [facility] for basic mobility tasks. Interpreter used [employer] [id number]        Exercises Other Exercises Other Exercises: written HEP provided with level 1 theraputty   Shoulder Instructions       General Comments Worked on manipulating objects and fine motor skills- opening/closing items, writing, etc. provided written HEP for level 1 theraputty, additionally issued pt built up foam to assist with functional grasp    Pertinent Vitals/ Pain       Pain Assessment: Faces Faces Pain Scale: Hurts little more Pain Location: bil legs Pain Descriptors / Indicators: Pins and needles Pain Intervention(s): Monitored during session  Home Living                                          Prior Functioning/Environment              Frequency  Min 2X/week        Progress Toward Goals  OT Goals(current goals can now be found in the care plan section)  Progress towards OT goals: Progressing toward goals  Acute Rehab OT Goals Patient Stated Goal: to improve and eventually go home OT Goal Formulation: With patient Time For Goal Achievement: 08/28/20 Potential to Achieve Goals: Good  Plan Discharge plan remains appropriate;Frequency remains appropriate    Co-evaluation      Reason for Co-Treatment: Necessary to address cognition/behavior during functional activity;For patient/therapist safety;To address functional/ADL transfers PT goals addressed during session:  Mobility/safety with mobility;Balance;Proper use of DME;Strengthening/ROM OT goals addressed during session: ADL's and self-care      AM-PAC OT "6 Clicks" Daily Activity     Outcome Measure   Help from another person eating meals?: A Little Help from another person taking care of personal grooming?: A Little Help from another person toileting, which includes using toliet, bedpan, or urinal?: A Lot Help from another person bathing (including washing, rinsing, drying)?: A Lot Help from another person to put on and taking off regular upper body clothing?: A Little Help from another person to put on and taking off regular lower body clothing?: A Lot 6 Click Score: 15    End of Session Equipment Utilized During Treatment: Gait belt;Rolling walker  OT Visit Diagnosis: Unsteadiness on feet (R26.81);Other abnormalities of gait and mobility (R26.89);Muscle weakness (generalized) (M62.81);Pain Pain - Right/Left:  (bilateral) Pain - part of body: Leg   Activity Tolerance Patient tolerated treatment well   Patient  Left in bed;with call bell/phone within reach;with bed alarm set   Nurse Communication Mobility status        Time: 5329-9242 OT Time Calculation (min): 29 min  Charges: OT General Charges $OT Visit: 1 Visit OT Treatments $Self Care/Home Management : 8-22 mins  Lenor Derrick., COTA/L Acute Rehabilitation Services [phone]    Barron Schmid 08/25/2020, 3:47 PM

## 2020-08-26 DIAGNOSIS — M792 Neuralgia and neuritis, unspecified: Secondary | ICD-10-CM | POA: Diagnosis not present

## 2020-08-26 LAB — CBC
HCT: 37.8 % — ABNORMAL LOW (ref 39.0–52.0)
Hemoglobin: 13 g/dL (ref 13.0–17.0)
MCH: 28.7 pg (ref 26.0–34.0)
MCHC: 34.4 g/dL (ref 30.0–36.0)
MCV: 83.4 fL (ref 80.0–100.0)
Platelets: 350 10*3/uL (ref 150–400)
RBC: 4.53 MIL/uL (ref 4.22–5.81)
RDW: 13.4 % (ref 11.5–15.5)
WBC: 15 10*3/uL — ABNORMAL HIGH (ref 4.0–10.5)
nRBC: 0 % (ref 0.0–0.2)

## 2020-08-26 LAB — COMPREHENSIVE METABOLIC PANEL
ALT: 39 U/L (ref 0–44)
AST: 32 U/L (ref 15–41)
Albumin: 3.7 g/dL (ref 3.5–5.0)
Alkaline Phosphatase: 34 U/L — ABNORMAL LOW (ref 38–126)
Anion gap: 9 (ref 5–15)
BUN: 11 mg/dL (ref 8–23)
CO2: 24 mmol/L (ref 22–32)
Calcium: 9.1 mg/dL (ref 8.9–10.3)
Chloride: 100 mmol/L (ref 98–111)
Creatinine, Ser: 0.98 mg/dL (ref 0.61–1.24)
GFR, Estimated: >60 mL/min (ref >60)
Glucose, Bld: 127 mg/dL — ABNORMAL HIGH (ref 70–99)
Potassium: 4.2 mmol/L (ref 3.5–5.1)
Sodium: 133 mmol/L — ABNORMAL LOW (ref 135–145)
Total Bilirubin: 0.5 mg/dL (ref 0.3–1.2)
Total Protein: 5.8 g/dL — ABNORMAL LOW (ref 6.5–8.1)

## 2020-08-26 LAB — GLUCOSE, CAPILLARY
Glucose-Capillary: 131 mg/dL — ABNORMAL HIGH (ref 70–99)
Glucose-Capillary: 137 mg/dL — ABNORMAL HIGH (ref 70–99)
Glucose-Capillary: 120 mg/dL — ABNORMAL HIGH (ref 70–99)
Glucose-Capillary: 152 mg/dL — ABNORMAL HIGH (ref 70–99)

## 2020-08-26 MED ORDER — CALCIUM CARBONATE ANTACID 500 MG PO CHEW: 2.0000 | CHEWABLE_TABLET | ORAL | Status: DC

## 2020-08-26 MED ORDER — ACD FORMULA A 0.73-2.45-2.2 GM/100ML VI SOLN: 1000.0000 mL | Status: DC

## 2020-08-26 MED ORDER — ACD FORMULA A 0.73-2.45-2.2 GM/100ML VI SOLN
Status: AC
  Filled 2020-08-26: qty 500

## 2020-08-26 MED ORDER — HEPARIN SODIUM (PORCINE) 1000 UNIT/ML IJ SOLN: 1000.0000 [IU] | Freq: Once | INTRAMUSCULAR | Status: DC

## 2020-08-26 MED ORDER — ACETAMINOPHEN 325 MG PO TABS: 650.0000 mg | ORAL_TABLET | ORAL | Status: DC | PRN

## 2020-08-26 MED ORDER — SODIUM CHLORIDE 0.9 % IV SOLN
INTRAVENOUS | Status: AC
  Filled 2020-08-26 (×2): qty 200

## 2020-08-26 MED ORDER — CALCIUM GLUCONATE-NACL 2-0.675 GM/100ML-% IV SOLN: 2.0000 g | Freq: Once | INTRAVENOUS | Status: DC

## 2020-08-26 MED ORDER — SODIUM CHLORIDE 0.9 % IV SOLN
Freq: Once | INTRAVENOUS | Status: AC
  Filled 2020-08-26: qty 200

## 2020-08-26 MED ORDER — DIPHENHYDRAMINE HCL 25 MG PO CAPS: 25.0000 mg | ORAL_CAPSULE | Freq: Four times a day (QID) | ORAL | Status: DC | PRN

## 2020-08-26 MED ORDER — CALCIUM CARBONATE ANTACID 500 MG PO CHEW
CHEWABLE_TABLET | ORAL | Status: AC
  Administered 2020-08-26: 400 mg via ORAL
  Filled 2020-08-26: qty 2

## 2020-08-26 MED ORDER — CALCIUM GLUCONATE-NACL 2-0.675 GM/100ML-% IV SOLN
2.0000 g | Freq: Once | INTRAVENOUS | Status: AC
  Filled 2020-08-26: qty 100

## 2020-08-26 MED ORDER — DIPHENHYDRAMINE HCL 25 MG PO CAPS
25.0000 mg | ORAL_CAPSULE | Freq: Four times a day (QID) | ORAL | Status: DC | PRN
Start: 2020-08-26 — End: 2020-09-01

## 2020-08-26 MED ORDER — CALCIUM CARBONATE ANTACID 500 MG PO CHEW
2.0000 | CHEWABLE_TABLET | ORAL | Status: AC
  Administered 2020-08-26: 400 mg via ORAL
  Filled 2020-08-26: qty 2

## 2020-08-26 MED ORDER — ACD FORMULA A 0.73-2.45-2.2 GM/100ML VI SOLN
1000.0000 mL | Status: DC
  Filled 2020-08-26 (×2): qty 1000

## 2020-08-26 MED ORDER — CALCIUM GLUCONATE-NACL 2-0.675 GM/100ML-% IV SOLN
INTRAVENOUS | Status: AC
  Administered 2020-08-26: 2000 mg via INTRAVENOUS
  Filled 2020-08-26: qty 100

## 2020-08-26 MED ORDER — HEPARIN SODIUM (PORCINE) 1000 UNIT/ML IJ SOLN
1000.0000 [IU] | Freq: Once | INTRAMUSCULAR | Status: DC
  Filled 2020-08-26: qty 1

## 2020-08-26 MED ORDER — POLYETHYLENE GLYCOL 3350 17 G PO PACK
17.0000 g | PACK | Freq: Every day | ORAL | Status: DC
  Administered 2020-08-26 – 2020-09-01 (×6): 17 g via ORAL
  Filled 2020-08-26 (×7): qty 1

## 2020-08-26 NOTE — Progress Notes (Signed)
NIF = -40 cmH20 x 2 with excellent effort by the patient.

## 2020-08-26 NOTE — Plan of Care (Signed)

## 2020-08-26 NOTE — Progress Notes (Signed)
PROGRESS NOTE    Jacob Cameron  QAS:[id number] DOB: July 24, 1955 DOA: 08/12/2020 PCP: Patient, No Pcp Per   Brief Narrative:  65 y.o.malewithout medical history, s/p J&J covid vaccine April 2021 who presented with paresthesias in the right hand and feet with associated pain that has progressed over the previous few days now causing him to be unable to walk. MRI brain was nonacute, so he was admitted to [facility] for neurology evaluation on the advice of teleneurology with MRI C/T/L spine pending. Neurology has initiated IVIG empirically. Symptoms are stable and severely disabling. PT and OT recommend CIR at this time.He has undergone MRI on 2/6 with no acute findings. Refusing LP.  Assessment & Plan:  Distal polyneuropathy:  -Unclear etiology. Vitamin B12 level wnl at 187. CK wnl. ESR 20, CRP 1.6.  - B12 supplementation due to low-normal level. -MRI cervical, thoracic and lumbar spine: Negative for acute findings.  Mild spondylosis of cervical, thoracic and lumbar spine noted. -Patient refusing LP. -Neurology is on board-completed IVIGx5 daysfor empiric Tx(ended 2/8) -Per Neurology, pt is planned for total 5 days of plasma exchange qOD dosing, thus far has received 3 treatments, last treatment was 2/14.  NIF: -40 with excellent effort by the patient. -Continue Neurontin 200mg  tid for better pain control.  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 Given lack of significant symptoms, do not believe this is likely to have been present long enough or severe enough to precipitate acute polyneuropathy. - Diabetes coordinator and dietitian consulted - Start metformin after we know contrasted exams will no  longer be neededand after GI symptoms have resolved.  - continue with SSI coverage as needed  Neutrophilic leukocytosis: Afebrile.,  CRP: Sed rate: WNL.  Leukocytosis is improving.  Continue to monitor.  No indication of antibiotics at this time.  Hypertension: Stable -Continue amlodipine  Dyslipidemia: LDL 192. LFTs wnl.  - Continued onrosuvastatin as tolerated  Tobacco use:  - Recommend tobacco cessation  Elevated TSH: Mildly elevated at 5.311. T3, T4 wnl.  - Would repeat TSH in 4-6 weeks  DVT prophylaxis: Lovenox Code Status: Full code Family Communication:  None present at bedside.  Plan of care discussed with patient in length and he verbalized understanding and agreed with it. Disposition Plan: CIR  Consultants:   Neurology  Procedures:   *None  Antimicrobials:   None  Status is: Inpatient   Dispo: The patient is from: Home              Neuro allergy anticipated d/c is to: CIR              Anticipated d/c date is: 3 days              Patient currently is not medically stable to d/c.   Difficult to place patient No      Subjective: Patient seen and examined.  Continues to have needlelike sensation and pain in both lower extremities, tells me that increased dose of Neurontin did not help him and he could not sleep well last night.  Remained afebrile.  No acute events overnight.  Denies chest pain, shortness of breath, palpitation, leg swelling, nausea or vomiting.   Objective: Vitals:   08/25/20 2331  08/26/20 0512 08/26/20 0755 08/26/20 1135  BP: 110/76 122/85 120/79 114/87  Pulse: 89 90 98 93  Resp: $Remo'18 18 18 18  'TpOni$ Temp: 98.3 F (36.8 C) 98.7 F (37.1 C) 98.3 F (36.8 C) 98.1 F (36.7 C)  TempSrc: Oral Oral Oral Oral  SpO2: 100% 97% 97% 100%  Weight:      Height:        Intake/Output Summary (Last 24 hours) at 08/26/2020 1446 Last data filed at 08/26/2020 1228 Gross per 24 hour  Intake 300 ml  Output 450 ml  Net -150 ml   Filed Weights    08/12/20 2053 08/21/20 0500  Weight: 80.3 kg 80 kg    Examination:  General exam: Appears calm and comfortable, on room air, communicating well respiratory system: Clear to auscultation. Respiratory effort normal. Cardiovascular system: S1 & S2 heard, RRR. No JVD, murmurs, rubs, gallops or clicks. No pedal edema. Gastrointestinal system: Abdomen is nondistended, soft and nontender. No organomegaly or masses felt. Normal bowel sounds heard. Central nervous system: Alert and oriented. No focal neurological deficits. Extremities: Symmetric 5 x 5 power. Skin: No rashes, lesions or ulcers Psychiatry: Judgement and insight appear normal. Mood & affect appropriate.    Data Reviewed: I have personally reviewed following labs and imaging studies  CBC: Recent Labs  Lab 08/20/20 0449 08/22/20 1023 08/22/20 1048 08/23/20 0400 08/25/20 0408 08/26/20 0339  WBC 10.4  --  13.7* 15.6* 15.0* 15.0*  HGB 13.4 15.0 14.0 14.0 13.4 13.0  HCT 38.8* 44.0 41.2 40.9 40.8 37.8*  MCV 81.7  --  83.4 82.5 84.3 83.4  PLT 346  --  371 353 324 355   Basic Metabolic Panel: Recent Labs  Lab 08/22/20 0556 08/22/20 1023 08/23/20 0400 08/24/20 0447 08/25/20 0408 08/26/20 0339  NA 131* 131* 132* 131* 132* 133*  K 4.3 4.1 4.3 4.0 4.2 4.2  CL 101 95* 102 101 103 100  CO2 24  --  $R'22 24 23 24  'Mq$ GLUCOSE 131* 215* 135* 132* 126* 127*  BUN $Re'17 20 16 11 10 11  'LrE$ CREATININE 1.06 0.90 0.98 0.90 0.88 0.98  CALCIUM 8.8*  --  8.8* 8.6* 8.9 9.1   GFR: Estimated Creatinine Clearance: 75.2 mL/min (by C-G formula based on SCr of 0.98 mg/dL). Liver Function Tests: Recent Labs  Lab 08/22/20 0556 08/23/20 0400 08/24/20 0447 08/25/20 0408 08/26/20 0339  AST $Re'20 15 19 19 'rFS$ 32  ALT 32 $Remo'20 24 22 'YYAuV$ 39  ALKPHOS 43 29* 36* 23* 34*  BILITOT 0.7 0.6 0.5 0.2* 0.5  PROT 6.4* 5.8* 5.6* 5.5* 5.8*  ALBUMIN 3.5 4.0 3.4* 3.9 3.7   No results for input(s): LIPASE, AMYLASE in the last 168 hours. No results for input(s): AMMONIA in the  last 168 hours. Coagulation Profile: No results for input(s): INR, PROTIME in the last 168 hours. Cardiac Enzymes: No results for input(s): CKTOTAL, CKMB, CKMBINDEX, TROPONINI in the last 168 hours. BNP (last 3 results) No results for input(s): PROBNP in the last 8760 hours. HbA1C: No results for input(s): HGBA1C in the last 72 hours. CBG: Recent Labs  Lab 08/25/20 1155 08/25/20 1607 08/25/20 2221 08/26/20 0648 08/26/20 1150  GLUCAP 116* 207* 100* 131* 137*   Lipid Profile: No results for input(s): CHOL, HDL, LDLCALC, TRIG, CHOLHDL, LDLDIRECT in the last 72 hours. Thyroid Function Tests: No results for input(s): TSH, T4TOTAL, FREET4, T3FREE, THYROIDAB in the last 72 hours. Anemia Panel: No results for input(s): VITAMINB12, FOLATE, FERRITIN, TIBC, IRON, RETICCTPCT in the last 72  hours. Sepsis Labs: No results for input(s): PROCALCITON, LATICACIDVEN in the last 168 hours.  No results found for this or any previous visit (from the past 240 hour(s)).    Radiology Studies: No results found.  Scheduled Meds: . calcium carbonate  2 tablet Oral Q3H  . Chlorhexidine Gluconate Cloth  6 each Topical Daily  . cyanocobalamin  1,000 mcg Intramuscular Q7 days  . enoxaparin (LOVENOX) injection  40 mg Subcutaneous Q24H  . gabapentin  200 mg Oral TID  . heparin sodium (porcine)  1,000 Units Intracatheter Once  . insulin aspart  0-6 Units Subcutaneous TID WC  . polyethylene glycol  17 g Oral Daily  . rosuvastatin  5 mg Oral Daily   Continuous Infusions: . therapeutic plasma exchange solution    . calcium gluconate    . citrate dextrose       LOS: 12 days   Time spent: 35  minutes   Ishan Sanroman Loann Quill, MD [facility]  If 7PM-7AM, please contact night-coverage [email or web address] 08/26/2020, 2:46 PM

## 2020-08-26 NOTE — Progress Notes (Signed)
Physical Therapy Treatment Patient Details Name: Jacob Cameron MRN: [id number] DOB: 10/15/1955 Today's Date: 08/26/2020    History of Present Illness Pt is a 65 y.o. male presenting with R neck pain and radiculopathy down his R arm with bil leg pain, parasthesias, and weakness. Symptoms began in legs then arm then neck. MRI showing no acute change in head. Symptoms/exam consistent with Alene Mires however unable to confirm diagnosis due to lack of CSF and PNCV/EMG.    PT Comments    Pt continues to make steady progress towards his goals, ambulating a slightly increased distance this date. However, due to his lower extremity weakness, impaired coordination, and balance deficits he continues to require min-modAx1 for transfers and short bedroom mobility with a RW. Focused remainder of session on increasing legs strength for improved stability, leg advancement, and knee stability with gait. Will continue to follow acutely. Current recommendations remain appropriate.   Follow Up Recommendations  CIR;Supervision for mobility/OOB     Equipment Recommendations  Rolling walker with 5" wheels;3in1 (PT);Wheelchair (measurements PT);Wheelchair cushion (measurements PT)    Recommendations for Other Services       Precautions / Restrictions Precautions Precautions: Fall Precaution Comments: Translator helpful but pt speaks pretty good English Restrictions Weight Bearing Restrictions: No    Mobility  Bed Mobility Overal bed mobility: Needs Assistance Bed Mobility: Supine to Sit;Sit to Supine     Supine to sit: HOB elevated;Supervision Sit to supine: Supervision   General bed mobility comments: Supervision for safety with extra time for all and use of rail.    Transfers Overall transfer level: Needs assistance Equipment used: Rolling walker (2 wheeled) Transfers: Sit to/from Stand Sit to Stand: Min assist;Mod assist         General transfer comment: Provided visual demonstration of  pushing up from current sitting surface with bil hands then transitioning to RW but pt continues to place and pull up with 1 hand on RW, min-modA and extra time to power up to stand. Noticied feet clip anteriorly on occasion, needing blocking for safety.  Ambulation/Gait Ambulation/Gait assistance: Min assist;Mod assist Gait Distance (Feet): 16 Feet (x3 bouts of ~10 ft > ~10 ft > ~16 ft) Assistive device: Rolling walker (2 wheeled) Gait Pattern/deviations: Decreased step length - right;Decreased weight shift to right;Decreased weight shift to left;Trunk flexed;Step-through pattern;Decreased step length - left;Decreased stride length;Scissoring Gait velocity: reduced Gait velocity interpretation: <1.31 ft/sec, indicative of household ambulator General Gait Details: Ambulates at slow pace with knee instability but no appreciated knee buckling this date. Min-modA for stability as fatigue increases. Tactile, verbal. and visual cues for weight shifting and knee extension with stance. Intermittent scissoring and cues to reposition feet, success.   Stairs             Wheelchair Mobility    Modified Rankin (Stroke Patients Only) Modified Rankin (Stroke Patients Only) Pre-Morbid Rankin Score: No symptoms Modified Rankin: Moderately severe disability     Balance Overall balance assessment: Needs assistance Sitting-balance support: Feet supported;No upper extremity supported Sitting balance-Leahy Scale: Fair Sitting balance - Comments: able to maintain static sitting without UE support   Standing balance support: Bilateral upper extremity supported Standing balance-Leahy Scale: Poor Standing balance comment: Reliant on UE support on RW and external assistance.                            Cognition Arousal/Alertness: Awake/alert Behavior During Therapy: WFL for tasks assessed/performed Overall Cognitive Status: Within Functional  Limits for tasks assessed                                         Exercises General Exercises - Lower Extremity Long Arc Quad: Strengthening;Both;5 reps;Seated (x2 sets, focusing on eccentric control) Hip ABduction/ADduction: Strengthening;Both;10 reps;Seated (against resistance, x2 sets each) Hip Flexion/Marching: Strengthening;Both;10 reps;Seated (x2 sets) Mini-Sqauts: Strengthening;Both;5 reps (partial sit to stand from EOB with use of bil UEs to push up, 2x5 reps)    General Comments        Pertinent Vitals/Pain Pain Assessment: 0-10 Pain Score: 7  Pain Location: bil legs Pain Descriptors / Indicators: Pins and needles Pain Intervention(s): Limited activity within patient's tolerance;Monitored during session;Repositioned    Home Living                      Prior Function            PT Goals (current goals can now be found in the care plan section) Acute Rehab PT Goals Patient Stated Goal: to improve and eventually go home PT Goal Formulation: With patient Time For Goal Achievement: 08/28/20 Potential to Achieve Goals: Good Progress towards PT goals: Progressing toward goals    Frequency    Min 3X/week      PT Plan Current plan remains appropriate    Co-evaluation              AM-PAC PT "6 Clicks" Mobility   Outcome Measure  Help needed turning from your back to your side while in a flat bed without using bedrails?: None Help needed moving from lying on your back to sitting on the side of a flat bed without using bedrails?: None Help needed moving to and from a bed to a chair (including a wheelchair)?: A Lot Help needed standing up from a chair using your arms (e.g., wheelchair or bedside chair)?: A Lot Help needed to walk in hospital room?: A Lot Help needed climbing 3-5 steps with a railing? : A Lot 6 Click Score: 16    End of Session Equipment Utilized During Treatment: Gait belt Activity Tolerance: Patient tolerated treatment well Patient left: in bed;with call  bell/phone within reach;with bed alarm set   PT Visit Diagnosis: Unsteadiness on feet (R26.81);Other abnormalities of gait and mobility (R26.89);Muscle weakness (generalized) (M62.81);Difficulty in walking, not elsewhere classified (R26.2);Other symptoms and signs involving the nervous system (R29.898)     Time: 4008-6761 PT Time Calculation (min) (ACUTE ONLY): 23 min  Charges:  $Gait Training: 8-22 mins $Therapeutic Exercise: 8-22 mins                     Raymond Gurney, PT, DPT Acute Rehabilitation Services  Pager: [phone] Office: [phone]    Jewel Baize 08/26/2020, 1:27 PM

## 2020-08-27 DIAGNOSIS — M792 Neuralgia and neuritis, unspecified: Secondary | ICD-10-CM | POA: Diagnosis not present

## 2020-08-27 LAB — GLUCOSE, CAPILLARY
Glucose-Capillary: 129 mg/dL — ABNORMAL HIGH (ref 70–99)
Glucose-Capillary: 106 mg/dL — ABNORMAL HIGH (ref 70–99)
Glucose-Capillary: 283 mg/dL — ABNORMAL HIGH (ref 70–99)

## 2020-08-27 LAB — CBC
HCT: 41.5 % (ref 39.0–52.0)
Hemoglobin: 13.7 g/dL (ref 13.0–17.0)
MCH: 28 pg (ref 26.0–34.0)
MCHC: 33 g/dL (ref 30.0–36.0)
MCV: 84.7 fL (ref 80.0–100.0)
Platelets: 352 10*3/uL (ref 150–400)
RBC: 4.9 MIL/uL (ref 4.22–5.81)
RDW: 13.4 % (ref 11.5–15.5)
WBC: 18.5 10*3/uL — ABNORMAL HIGH (ref 4.0–10.5)
nRBC: 0 % (ref 0.0–0.2)

## 2020-08-27 LAB — FOLATE: Folate: 15.3 ng/mL (ref >5.9)

## 2020-08-27 LAB — BASIC METABOLIC PANEL
Anion gap: 9 (ref 5–15)
BUN: 10 mg/dL (ref 8–23)
CO2: 20 mmol/L — ABNORMAL LOW (ref 22–32)
Calcium: 9.3 mg/dL (ref 8.9–10.3)
Chloride: 102 mmol/L (ref 98–111)
Creatinine, Ser: 1.02 mg/dL (ref 0.61–1.24)
GFR, Estimated: >60 mL/min (ref >60)
Glucose, Bld: 126 mg/dL — ABNORMAL HIGH (ref 70–99)
Potassium: 4.6 mmol/L (ref 3.5–5.1)
Sodium: 131 mmol/L — ABNORMAL LOW (ref 135–145)

## 2020-08-27 MED ORDER — SODIUM CHLORIDE 0.9 % IV SOLN
500.0000 mg | Freq: Two times a day (BID) | INTRAVENOUS | Status: AC
  Administered 2020-08-27 – 2020-08-31 (×10): 500 mg via INTRAVENOUS
  Filled 2020-08-27 (×10): qty 4

## 2020-08-27 MED ORDER — INSULIN ASPART 100 UNIT/ML ~~LOC~~ SOLN
6.0000 [IU] | Freq: Once | SUBCUTANEOUS | Status: AC
  Administered 2020-08-27: 6 [IU] via SUBCUTANEOUS

## 2020-08-27 NOTE — Progress Notes (Signed)
Occupational Therapy Treatment Patient Details Name: Jacob Cameron MRN: [id number] DOB: Jun 25, 1956 Today's Date: 08/27/2020    History of present illness Pt is a 65 y.o. male presenting with R neck pain and radiculopathy down his R arm with bil leg pain, parasthesias, and weakness. Symptoms began in legs then arm then neck. MRI showing no acute change in head. Symptoms/exam consistent with Alene Mires however unable to confirm diagnosis due to lack of CSF and PNCV/EMG.   OT comments  Pt progressing towards established OT goals. Pt continues to present with decreased grasp/pinch strength at BUE, balance, strength, and activity tolerance. Despite pain and fatigue, pt very motivated to participate in therapy and return to PLOF. Pt requiring Min A and RW for functional transfers. Pt performing oral care sitting at sink with Min A and built up handles. Pt participate in theraputty exercises for bilateral hands with level one theraputty; would benefit to transition to level two putty. Continue to recommend dc to CIR for intensive OT and will continue to follow acutely as admitted. Updated goals.   Follow Up Recommendations  CIR    Equipment Recommendations  3 in 1 bedside commode    Recommendations for Other Services PT consult    Precautions / Restrictions Precautions Precautions: Fall       Mobility Bed Mobility Overal bed mobility: Needs Assistance Bed Mobility: Supine to Sit     Supine to sit: Supervision;HOB elevated     General bed mobility comments: Supervision for safety  Transfers Overall transfer level: Needs assistance Equipment used: Rolling walker (2 wheeled) Transfers: Sit to/from Stand Sit to Stand: Min assist         General transfer comment: Min A to power up into standing. Cues for hand placement    Balance Overall balance assessment: Needs assistance Sitting-balance support: No upper extremity supported;Feet supported Sitting balance-Leahy Scale: Good      Standing balance support: Bilateral upper extremity supported;During functional activity Standing balance-Leahy Scale: Poor Standing balance comment: Reliant on UE support and Min A for maintaining balance                           ADL either performed or assessed with clinical judgement   ADL Overall ADL's : Needs assistance/impaired     Grooming: Oral care;Sitting;Minimal assistance Grooming Details (indicate cue type and reason): built up handle utilized. Using BUE to switch position of toothbrush due to poor in hand manipulation. Requiring Min A for initating opening tooth paste due to pinch weakness.             Lower Body Dressing: Sit to/from stand;Moderate assistance Lower Body Dressing Details (indicate cue type and reason): Mod A for managing socks and position hands. Poor grasp and frequently slipping away from sock Toilet Transfer: Minimal assistance;Stand-pivot;RW (simulated to recliner) Toilet Transfer Details (indicate cue type and reason): Min A for power up and maintaining balance in standing         Functional mobility during ADLs: Minimal assistance;Rolling walker General ADL Comments: Pt continues to present with decreased grasp strength, coorindation, balance, and acitviyt tolerance. Despite fatigue, pt very motivated to partipate in therapy     Vision       Perception     Praxis      Cognition Arousal/Alertness: Awake/alert Behavior During Therapy: WFL for tasks assessed/performed Overall Cognitive Status: Within Functional Limits for tasks assessed  General Comments: Very motivated despite pain and fatigue        Exercises Exercises: Other exercises Other Exercises Other Exercises: Facilitating theraputty exercises. BUE to roll into ball. flattening with LUE, rolling into "worm" and then pinching between thumb and index finger; repeat with LUE. Assist to maintaing positioning of  digits 3-5. Level one theraputty used. Other Exercises: Hiding four small objects into level one theraputty. Pt having to pull them out of putty with BUEs.   Shoulder Instructions       General Comments VSS    Pertinent Vitals/ Pain       Pain Assessment: Faces Faces Pain Scale: Hurts little more Pain Location: bil legs Pain Descriptors / Indicators: Pins and needles Pain Intervention(s): Limited activity within patient's tolerance;Monitored during session;Repositioned  Home Living                                          Prior Functioning/Environment              Frequency  Min 2X/week        Progress Toward Goals  OT Goals(current goals can now be found in the care plan section)  Progress towards OT goals: Progressing toward goals  Acute Rehab OT Goals Patient Stated Goal: to improve and eventually go home OT Goal Formulation: With patient Time For Goal Achievement: 08/28/20 Potential to Achieve Goals: Good ADL Goals Pt Will Perform Grooming: with set-up;with supervision;standing Pt Will Perform Lower Body Dressing: sit to/from stand;with min guard assist Pt Will Transfer to Toilet: with min guard assist;ambulating;bedside commode Pt Will Perform Toileting - Clothing Manipulation and hygiene: with min guard assist;sit to/from stand;sitting/lateral leans  Plan Discharge plan remains appropriate;Frequency remains appropriate    Co-evaluation                 AM-PAC OT "6 Clicks" Daily Activity     Outcome Measure   Help from another person eating meals?: A Little Help from another person taking care of personal grooming?: A Little Help from another person toileting, which includes using toliet, bedpan, or urinal?: A Lot Help from another person bathing (including washing, rinsing, drying)?: A Lot Help from another person to put on and taking off regular upper body clothing?: A Little Help from another person to put on and taking off  regular lower body clothing?: A Lot 6 Click Score: 15    End of Session Equipment Utilized During Treatment: Gait belt;Rolling walker  OT Visit Diagnosis: Unsteadiness on feet (R26.81);Other abnormalities of gait and mobility (R26.89);Muscle weakness (generalized) (M62.81);Pain Pain - Right/Left:  (bilateral) Pain - part of body: Leg   Activity Tolerance Patient tolerated treatment well   Patient Left with call bell/phone within reach;in chair;with chair alarm set   Nurse Communication Mobility status        Time: 6812-7517 OT Time Calculation (min): 41 min  Charges: OT General Charges $OT Visit: 1 Visit OT Treatments $Self Care/Home Management : 23-37 mins $Therapeutic Exercise: 8-22 mins  Khilee Hendricksen MSOT, OTR/L Acute Rehab Pager: [phone] Office: [phone]   Theodoro Grist Zackery Brine 08/27/2020, 1:10 PM

## 2020-08-27 NOTE — Progress Notes (Signed)
Pt achieved NIF -40 cmH20 with excellent effort x 2.

## 2020-08-27 NOTE — Progress Notes (Signed)
Neurology Progress Note  CC: GBS   Interval History: Jacob Cameron is a 65 y.o. male who is now day 12 of hospitalization. He reports about 3 weeks of progressive LE > UE weakness and paresthesias. There are pins and needle sensations up to about the knees and into the hands/forearms. He has examination findings suggestive of GBS, but confirmatory CSF has not been obtained (patient declined). Also, PNCV/EMG not available in-house. An MRI series through the entire neuraxis failed to reveal alternative etiologies. CRP and ESR minimally elevated and B12 slightly low (replaced). HIV, CK, TSH, and other tests unrevealing. He denies any substantial improvement after receiving full course of IVIG and now 3 PLEX treatments.   Subjective: No acute overnight events.  Patient evaluated at bedside this morning, no family present in the room.  Patient denies any new complaints but states his ongoing problems are not improving or worsening.   Objective: Current vital signs: BP 98/71 (BP Location: Right Arm)   Pulse 89   Temp 97.6 F (36.4 C) (Oral)   Resp 18   Ht $R'5\' 7"'jm$  (1.702 m)   Wt 71.9 kg   SpO2 97%   BMI 24.83 kg/m  Vital signs in last 24 hours: Temp:  [97.6 F (36.4 C)-98.9 F (37.2 C)] 97.6 F (36.4 C) (02/17 0733) Pulse Rate:  [88-102] 89 (02/17 0733) Resp:  [16-20] 18 (02/17 0733) BP: (96-124)/(64-83) 98/71 (02/17 0733) SpO2:  [96 %-99 %] 97 % (02/17 0733) Weight:  [71.9 kg-72 kg] 71.9 kg (02/17 0500)   Social History:  reports that he has been smoking cigarettes. He has a 2.00 pack-year smoking history. He has never used smokeless tobacco. He reports that he does not drink alcohol and does not use drugs.  Physical Exam  Constitutional: Appears well-developed and well-nourished.  Psych: Affect appropriate to situation Eyes: No scleral injection HENT: No OP obstrucion MSK: no joint deformities.  Cardiovascular: Normal rate and regular rhythm.  Respiratory: Effort normal, non-labored  breathing GI: Soft.  No distension. There is no tenderness.  Skin: WDI  Neuro: Mental Status: Patient is awake, alert, oriented to person, place, month, year, and situation Patient is able to give a clear and coherent history No signs of aphasia or neglect Cranial Nerves: II: Visual Fields not testedl. Pupils are equal, round, and reactive to light.  III,IV, VI: EOMI without ptosis or diploplia.  V: Facial sensation is symmetric to temperature VII: Facial movement is symmetric.  VIII: hearing is intact to voice X: deferred XI: Shoulder shrug is symmetric. XII: tongue is midline without atrophy or fasciculations.  Motor: Tone is normal. Bulk is normal.4/5 wrist flexion/extension, and hand grip strength ~50% expected. In lower extremities, I can easily overcome hip flexors, dorsiflexors. Knees bend when he elevates the legs against gravity several inches off the bed. (4-/5 diffusely, symmetrically in both legs). Sensory: Sensation is symmetrically reduced to above the knees to temperature and severely reduced throughout to vibratory stimuli. UEs are slightly less affected with deficits to mid-forearm. Mid-chest and abdomen decreased sensation is noted as well. Deep Tendon Reflexes: 0/4 and symmetric in the biceps, brachioradialis, triceps, patellae, and Achilles. Plantar responses are mute. Cerebellar: deferred Gait: Not attempted  Labs: I have reviewed labs in epic and the results pertinent to this consultation are: As noted above. Low B12 has been replaced.  Imaging: I have reviewed the images obtained.  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 -Symptoms/signs remains same after treatment with IVIG and  3 rounds of PLEX. -Overall clinical presentation consistent with GBS.  Vitamin B12 level 187 which is being replaced. -Hyponatremia noted on left work with Na at 131 today, it is improving.  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 -There has been minimal improvement with IVIG and 3 treatments of PLEX.  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  Although IV steroids are contraindicated in treatment of GBS but looking at overall clinical picture and suspecting an variant of GBS it should be prudent to consider adding IV steroids.  Most likely patient would require outpatient IVIG regular treatments as well. -PT suggests [place] inpatient rehabilitation -Negative inspiratory force NIF, -40 with good effort. -Patient has refused LP for further supporting diagnostic information regarding GBS -MRI brain without acute intracranial abnormality.  MRI of cervical, thoracic and lumbar spine without evidence for myelopathy.   Recommendations: -IV steroids, Solu Medrol 500 mg twice daily. -Follow-up Sjogren's syndrome antibody panel, folate, vitamin B1, serum protein electrophoresis, urine heavy metals profile, plasma porphyrins, RF, ANA. - complete course of PLEX - will need IPR to regain function/mobility - continue periodic monitoring of respiratory status, though stable at this time with NIF -40 and no respiratory complaints -Neurology will continue to follow  Honor Junes, MD PGY-1, Resident

## 2020-08-27 NOTE — Progress Notes (Signed)
PROGRESS NOTE    Jacob Cameron  QMG:[id number] DOB: [birth year] DOA: 08/12/2020 PCP: Patient, No Pcp Per   Brief Narrative:  65 y.o.malewithout medical history, s/p J[employer] covid vaccine April 2021 who presented with paresthesias in the right hand and feet with associated pain that has progressed over the previous few days now causing him to be unable to walk. MRI brain was nonacute, so he was admitted to [facility] for neurology evaluation on the advice of teleneurology with MRI C/T/L spine pending. Neurology has initiated IVIG empirically. Symptoms are stable and severely disabling. PT and OT recommend CIR at this time.He has undergone MRI on 2/6 with no acute findings. Refusing LP.  Assessment & Plan:  Sensory and motor polyneuropathy:  -Unclear etiology.  High suspicion of variant of GBS - Vitamin B12 level at 187. CK wnl. ESR 20, CRP 1.6.  -MRI cervical, thoracic and lumbar spine: Negative for acute findings.  Mild spondylosis of cervical, thoracic and lumbar spine noted. -Patient refusing LP. -completed IVIGx5 daysfor empiric Tx(ended 2/8) -Per Neurology, pt is planned for total 5 days of plasma exchange qOD dosing, thus far has received 4 treatments, last treatment was 2/16.  NIF: -40 with excellent effort by the patient. -On 2/17: Patient started on high dose of IV steroids 500 mg twice daily.  Patient may require outpatient IVIG treatments on monthly basis -Sjogren syndrome antibody panel, folate, B1, serum protein electrophoresis, urine heavy metal profile, plasma porphyrin, RF, ANA-ordered by neurology -Continue Neurontin 200mg  tid for better pain control.  -Continue B12 supplementation due to low-normal level. 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 Given lack of significant symptoms, do not believe this is likely to have been present long enough or severe enough to precipitate acute polyneuropathy. - Diabetes coordinator and dietitian consulted - Start metformin after we know contrasted exams will no longer be neededand after GI symptoms have resolved.  - continue with SSI coverage as needed  Neutrophilic leukocytosis: Afebrile.,  CRP: Sed rate: WNL.  Leukocytosis: Trended up from 15,000-18.5.  Expected rise in leukocyte count in the setting of high-dose steroids.  - Continue to monitor.  No indication of antibiotics at this time.  Hypertension: Stable -Continue amlodipine  Dyslipidemia: LDL 192. LFTs wnl.  - Continued onrosuvastatin as tolerated  Tobacco use:  - Recommend tobacco cessation  Elevated TSH: Mildly elevated at 5.311. T3, T4 wnl.  - Would repeat TSH in 4-6 weeks  DVT prophylaxis: Lovenox Code Status: Full code Family Communication:  None present at bedside.  Plan of care discussed with patient in length and he verbalized understanding and agreed with it. Disposition Plan: CIR  Consultants:   Neurology  Procedures:   *None  Antimicrobials:   None  Status is: Inpatient   Dispo: The patient is from: Home              Neuro allergy anticipated d/c is to: CIR              Anticipated d/c date is: 3 days              Patient currently is not medically stable to d/c.   Difficult to place patient No      Subjective: Patient seen and  examined.  Continues to have needlelike sensation and pain in both lower extremities with no improvement with the treatment.  No acute events overnight.  Remained afebrile.  Objective: Vitals:   08/27/20 0500 08/27/20 0538 08/27/20 0733 08/27/20 1213  BP:  96/64 98/71 103/77  Pulse:  91 89 95  Resp:   18 18  Temp:  98.3 F (36.8 C) 97.6 F (36.4 C) 97.7 F (36.5 C)  TempSrc:  Oral Oral Oral  SpO2:   97% 97% 99%  Weight: 71.9 kg     Height:        Intake/Output Summary (Last 24 hours) at 08/27/2020 1402 Last data filed at 08/27/2020 0917 Gross per 24 hour  Intake 320 ml  Output 600 ml  Net -280 ml   Filed Weights   08/21/20 0500 08/26/20 1615 08/27/20 0500  Weight: 80 kg 72 kg 71.9 kg    Examination:  General exam: Appears calm and comfortable, on room air, communicating well respiratory system: Clear to auscultation. Respiratory effort normal. Cardiovascular system: S1 & S2 heard, RRR. No JVD, murmurs, rubs, gallops or clicks. No pedal edema. Gastrointestinal system: Abdomen is nondistended, soft and nontender. No organomegaly or masses felt. Normal bowel sounds heard. Central nervous system: Alert and oriented.  Following commands.  Power 4 out of 5 in bilateral upper and lower extremities. Skin: No rashes, lesions or ulcers Psychiatry: Judgement and insight appear normal. Mood & affect appropriate.    Data Reviewed: I have personally reviewed following labs and imaging studies  CBC: Recent Labs  Lab 08/22/20 1048 08/23/20 0400 08/25/20 0408 08/26/20 0339 08/27/20 0338  WBC 13.7* 15.6* 15.0* 15.0* 18.5*  HGB 14.0 14.0 13.4 13.0 13.7  HCT 41.2 40.9 40.8 37.8* 41.5  MCV 83.4 82.5 84.3 83.4 84.7  PLT 371 353 324 350 098   Basic Metabolic Panel: Recent Labs  Lab 08/23/20 0400 08/24/20 0447 08/25/20 0408 08/26/20 0339 08/27/20 0338  NA 132* 131* 132* 133* 131*  K 4.3 4.0 4.2 4.2 4.6  CL 102 101 103 100 102  CO2 $Re'22 24 23 24 'jto$ 20*  GLUCOSE 135* 132* 126* 127* 126*  BUN $Re'16 11 10 11 10  'kve$ CREATININE 0.98 0.90 0.88 0.98 1.02  CALCIUM 8.8* 8.6* 8.9 9.1 9.3   GFR: Estimated Creatinine Clearance: 66.6 mL/min (by C-G formula based on SCr of 1.02 mg/dL). Liver Function Tests: Recent Labs  Lab 08/22/20 0556 08/23/20 0400 08/24/20 0447 08/25/20 0408 08/26/20 0339  AST $Re'20 15 19 19 'kwG$ 32  ALT 32 $Remo'20 24 22 'vZqld$ 39  ALKPHOS 43 29* 36* 23* 34*  BILITOT 0.7 0.6 0.5 0.2* 0.5   PROT 6.4* 5.8* 5.6* 5.5* 5.8*  ALBUMIN 3.5 4.0 3.4* 3.9 3.7   No results for input(s): LIPASE, AMYLASE in the last 168 hours. No results for input(s): AMMONIA in the last 168 hours. Coagulation Profile: No results for input(s): INR, PROTIME in the last 168 hours. Cardiac Enzymes: No results for input(s): CKTOTAL, CKMB, CKMBINDEX, TROPONINI in the last 168 hours. BNP (last 3 results) No results for input(s): PROBNP in the last 8760 hours. HbA1C: No results for input(s): HGBA1C in the last 72 hours. CBG: Recent Labs  Lab 08/26/20 1150 08/26/20 1459 08/26/20 2151 08/27/20 0627 08/27/20 1215  GLUCAP 137* 120* 152* 129* 106*   Lipid Profile: No results for input(s): CHOL, HDL, LDLCALC, TRIG, CHOLHDL, LDLDIRECT in the last 72 hours. Thyroid Function Tests: No results for input(s): TSH, T4TOTAL, FREET4, T3FREE, THYROIDAB in the last 72 hours. Anemia Panel:  Recent Labs    08/27/20 1029  FOLATE 15.3   Sepsis Labs: No results for input(s): PROCALCITON, LATICACIDVEN in the last 168 hours.  No results found for this or any previous visit (from the past 240 hour(s)).    Radiology Studies: No results found.  Scheduled Meds: . Chlorhexidine Gluconate Cloth  6 each Topical Daily  . cyanocobalamin  1,000 mcg Intramuscular Q7 days  . enoxaparin (LOVENOX) injection  40 mg Subcutaneous Q24H  . gabapentin  200 mg Oral TID  . heparin sodium (porcine)  1,000 Units Intracatheter Once  . insulin aspart  0-6 Units Subcutaneous TID WC  . polyethylene glycol  17 g Oral Daily  . rosuvastatin  5 mg Oral Daily   Continuous Infusions: . citrate dextrose    . methylPREDNISolone (SOLU-MEDROL) injection 500 mg (08/27/20 1142)     LOS: 13 days   Time spent: 35  minutes   Nancye Grumbine Loann Quill, MD [facility]  If 7PM-7AM, please contact night-coverage [email or web address] 08/27/2020, 2:02 PM

## 2020-08-28 DIAGNOSIS — M792 Neuralgia and neuritis, unspecified: Secondary | ICD-10-CM | POA: Diagnosis not present

## 2020-08-28 DIAGNOSIS — G610 Guillain-Barre syndrome: Secondary | ICD-10-CM | POA: Diagnosis not present

## 2020-08-28 LAB — BASIC METABOLIC PANEL
Anion gap: 8 (ref 5–15)
BUN: 19 mg/dL (ref 8–23)
CO2: 21 mmol/L — ABNORMAL LOW (ref 22–32)
Calcium: 9.2 mg/dL (ref 8.9–10.3)
Chloride: 100 mmol/L (ref 98–111)
Creatinine, Ser: 1.13 mg/dL (ref 0.61–1.24)
GFR, Estimated: >60 mL/min (ref >60)
Glucose, Bld: 205 mg/dL — ABNORMAL HIGH (ref 70–99)
Potassium: 4.1 mmol/L (ref 3.5–5.1)
Sodium: 129 mmol/L — ABNORMAL LOW (ref 135–145)

## 2020-08-28 LAB — GLUCOSE, CAPILLARY
Glucose-Capillary: 146 mg/dL — ABNORMAL HIGH (ref 70–99)
Glucose-Capillary: 196 mg/dL — ABNORMAL HIGH (ref 70–99)
Glucose-Capillary: 229 mg/dL — ABNORMAL HIGH (ref 70–99)
Glucose-Capillary: 173 mg/dL — ABNORMAL HIGH (ref 70–99)
Glucose-Capillary: 292 mg/dL — ABNORMAL HIGH (ref 70–99)

## 2020-08-28 LAB — CBC
HCT: 37.5 % — ABNORMAL LOW (ref 39.0–52.0)
Hemoglobin: 12.3 g/dL — ABNORMAL LOW (ref 13.0–17.0)
MCH: 27.4 pg (ref 26.0–34.0)
MCHC: 32.8 g/dL (ref 30.0–36.0)
MCV: 83.5 fL (ref 80.0–100.0)
Platelets: 343 10*3/uL (ref 150–400)
RBC: 4.49 MIL/uL (ref 4.22–5.81)
RDW: 13.2 % (ref 11.5–15.5)
WBC: 20.9 10*3/uL — ABNORMAL HIGH (ref 4.0–10.5)
nRBC: 0 % (ref 0.0–0.2)

## 2020-08-28 LAB — SJOGRENS SYNDROME-A EXTRACTABLE NUCLEAR ANTIBODY: SSA (Ro) (ENA) Antibody, IgG: <0.2 AI (ref 0.0–0.9)

## 2020-08-28 LAB — MISC LABCORP TEST (SEND OUT): Labcorp test code: 505240

## 2020-08-28 LAB — SJOGRENS SYNDROME-B EXTRACTABLE NUCLEAR ANTIBODY: SSB (La) (ENA) Antibody, IgG: <0.2 AI (ref 0.0–0.9)

## 2020-08-28 LAB — ANA W/REFLEX IF POSITIVE: Anti Nuclear Antibody (ANA): NEGATIVE

## 2020-08-28 LAB — RHEUMATOID FACTOR: Rheumatoid fact SerPl-aCnc: <10 IU/mL (ref <14.0)

## 2020-08-28 NOTE — Progress Notes (Signed)
Neurology Progress Note  Subjective: No acute overnight events.  Patient evaluated at bedside this morning, no family present in the room.  Patient states he is feeling miraculously better today for the very 1st time.  He states he gets with 100% pins-and-needles sensation in his lower extremities which is reduced to 25 to 30% today.  He gets for the 1st time yesterday was able to sleep well excited to follow-up with physical therapy today.  He complains of some rigidity/stiffness in his feet.  Objective: Current vital signs: BP 95/69 (BP Location: Left Arm)   Pulse 88   Temp 98.2 F (36.8 C) (Oral)   Resp 16   Ht 5\' 7"  (1.702 m)   Wt 71 kg   SpO2 100%   BMI 24.52 kg/m  Vital signs in last 24 hours: Temp:  [97.7 F (36.5 C)-98.9 F (37.2 C)] 98.2 F (36.8 C) (02/18 0727) Pulse Rate:  [88-100] 88 (02/18 0727) Resp:  [16-18] 16 (02/18 0727) BP: (95-108)/(69-81) 95/69 (02/18 0727) SpO2:  [95 %-100 %] 100 % (02/18 0727) Weight:  [71 kg] 71 kg (02/18 0500)   Social History:  reports that he has been smoking cigarettes. He has a 2.00 pack-year smoking history. He has never used smokeless tobacco. He reports that he does not drink alcohol and does not use drugs.  Physical Exam  Constitutional: Appears well-developed and well-nourished.  Psych: Affect appropriate to situation Eyes: No scleral injection HENT: No OP obstrucion MSK: no joint deformities.  Cardiovascular: Normal rate and regular rhythm.  Respiratory: Effort normal, non-labored breathing GI: Soft.  No distension. There is no tenderness.  Skin: WDI  Neuro: Mental Status: Patient is awake, alert, oriented to person, place, month, year, and situation Patient is able to give a clear and coherent history No signs of aphasia or neglect Cranial Nerves: II: Visual Fields not testedl. Pupils are equal, round, and reactive to light.  III,IV, VI: EOMI without ptosis or diploplia.  V: Facial sensation is symmetric to  temperature VII: Facial movement is symmetric.  VIII: hearing is intact to voice X: deferred XI: Shoulder shrug is symmetric. XII: tongue is midline without atrophy or fasciculations.  Motor: Tone is normal. Bulk is normal.4/5 wrist flexion/extension, and hand grip strength ~50% expected. In lower extremities, I can easily overcome hip flexors, dorsiflexors. Knees bend when he elevates the legs against gravity several inches off the bed. (4-/5 diffusely, symmetrically in both legs). Sensory: Sensation is symmetrically reduced to above the knees to temperature and  reduced throughout to vibratory stimuli. UEs are slightly less affected with deficits to mid-forearm. Mid-chest and abdomen decreased sensation is noted as well.  Paresthesia has significantly improved today reduced from 100% to 25- 30%.  Deep Tendon Reflexes: 0/4 and symmetric in the biceps, brachioradialis, triceps, patellae, and Achilles. Plantar responses are mute. Cerebellar: deferred Gait: Not attempted  Labs: I have reviewed labs in epic and the results pertinent to this consultation are: As noted above. Low B12 has been replaced.  Imaging: I have reviewed the images obtained.  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 -Symptoms/signs remains same after treatment with IVIG and 3 rounds of PLEX. -Overall clinical presentation consistent with GBS.  Vitamin B12 level 187 which is being replaced. -Hyponatremia noted on left work with Na at 129 today.  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 -There was  minimal improvement with IVIG and 3 treatments of PLEX.  High suspicion of variant of GBS at this point and planning to added IV steroids yesterday on 2/17 to the treatment.  Significant improvement in paresthesia in lower extremities today.  Although  IV steroids are contraindicated in treatment of GBS but looking at overall clinical picture and suspecting an variant of GBS it should be prudent to consider adding IV steroids.  Most likely patient would require outpatient IVIG regular treatments as well. -PT suggests [place] inpatient rehabilitation -Negative inspiratory force NIF, -44 with good effort. -Patient has refused LP for further supporting diagnostic information regarding GBS -MRI brain without acute intracranial abnormality.  MRI of cervical, thoracic and lumbar spine without evidence for myelopathy.   Recommendations: -Continue IV steroids, Solu Medrol 500 mg twice daily. -Continue correcting hyponatremia.  -Follow-up Sjogren's syndrome antibody panel, folate, vitamin B1, serum protein electrophoresis, urine heavy metals profile, plasma porphyrins, RF, ANA. - Complete course of PLEX - Will need IPR to regain function/mobility - Continue periodic monitoring of respiratory status, though stable at this time with NIF -44 and no respiratory complaints -Neurology will continue to follow  Arnoldo Lenis, MD PGY-1, Resident

## 2020-08-28 NOTE — Progress Notes (Signed)
Physical Therapy Treatment Patient Details Name: Jacob Cameron MRN: [id number] DOB: [birth year] Today's Date: 08/28/2020    History of Present Illness Pt is a 65 y.o. male presenting with R neck pain and radiculopathy down his R arm with bil leg pain, parasthesias, and weakness. Symptoms began in legs then arm then neck. MRI showing no acute change in head. Symptoms/exam consistent with Jacob Cameron however unable to confirm diagnosis due to lack of CSF and PNCV/EMG.    PT Comments    Pt making good progress with mobility, ambulating x3 bouts of ~15 ft each bout with a RW and minA majority of time for stability. However, as pt fatigues his coordination and knee stability decreases resulting in him intermittently needing modA. While he reports he no longer has pins/needles sensations he still has poor proprioception, relying on his vision for feet placement with gait. In addition, his L foot tends to slide anteriorly during transfers from sit to stand, needing blocking or redirecting. Focused remainder of session on increasing his strength for improved mobility safety and independence. Will continue to follow acutely. Current recommendations remain appropriate.    Follow Up Recommendations  CIR;Supervision for mobility/OOB     Equipment Recommendations  Rolling walker with 5" wheels;3in1 (PT);Wheelchair (measurements PT);Wheelchair cushion (measurements PT)    Recommendations for Other Services       Precautions / Restrictions Precautions Precautions: Fall Precaution Comments: Translator helpful but pt speaks pretty good English Restrictions Weight Bearing Restrictions: No    Mobility  Bed Mobility Overal bed mobility: Needs Assistance Bed Mobility: Supine to Sit     Supine to sit: Supervision     General bed mobility comments: Supervision for safety with extra time and use of rail. Bed flat.    Transfers Overall transfer level: Needs assistance Equipment used: Rolling walker  (2 wheeled) Transfers: Sit to/from Stand Sit to Stand: Min assist;Mod assist         General transfer comment: Cued pt to push up from current surface and then transition hands to RW, improved carryover with subsequent reps. However, when trying to stand several times his L foot would slip anteriorly and he would need modA to come to stand. Otherwise, only minA when feet were in proper positions.  Ambulation/Gait Ambulation/Gait assistance: Min assist;Mod assist Gait Distance (Feet): 15 Feet (x3 bouts) Assistive device: Rolling walker (2 wheeled) Gait Pattern/deviations: Decreased step length - right;Decreased weight shift to right;Decreased weight shift to left;Trunk flexed;Step-through pattern;Decreased step length - left;Decreased stride length Gait velocity: reduced Gait velocity interpretation: <1.31 ft/sec, indicative of household ambulator General Gait Details: Ambulates at slow pace with knee instability but no appreciated knee buckling this date. Min-modA for stability as fatigue increases. Tactile, verbal, and visual cues for weight shifting and knee extension with stance. Increased tactile cues at L quad than R. Poor judgement with distances and placement of feet at times, needing cues to correct for safety. Chair follow.   Stairs             Wheelchair Mobility    Modified Rankin (Stroke Patients Only) Modified Rankin (Stroke Patients Only) Pre-Morbid Rankin Score: No symptoms Modified Rankin: Moderately severe disability     Balance Overall balance assessment: Needs assistance Sitting-balance support: Feet supported;No upper extremity supported Sitting balance-Leahy Scale: Good Sitting balance - Comments: Reaches off BOS to donn socks, supervision for safety.   Standing balance support: Bilateral upper extremity supported Standing balance-Leahy Scale: Poor Standing balance comment: Reliant on UE support on RW and external  assistance.                             Cognition Arousal/Alertness: Awake/alert Behavior During Therapy: WFL for tasks assessed/performed Overall Cognitive Status: Within Functional Limits for tasks assessed                                        Exercises General Exercises - Lower Extremity Long Arc Quad: Strengthening;Both;Seated;10 reps (focusing on eccentric control) Hip ABduction/ADduction: Strengthening;Both;10 reps;Seated (against resistance hip abduction and adduction) Hip Flexion/Marching: Strengthening;Both;10 reps;Seated Mini-Sqauts: Strengthening;Both;10 reps (partial sit to stand from recliner with use of bil UEs to push up, blocking L foot from sliding)    General Comments        Pertinent Vitals/Pain Pain Assessment: No/denies pain Pain Intervention(s): Monitored during session    Home Living                      Prior Function            PT Goals (current goals can now be found in the care plan section) Acute Rehab PT Goals Patient Stated Goal: to improve and eventually go home PT Goal Formulation: With patient Time For Goal Achievement: 09/11/20 Potential to Achieve Goals: Good Progress towards PT goals: Progressing toward goals    Frequency    Min 3X/week      PT Plan Current plan remains appropriate    Co-evaluation              AM-PAC PT "6 Clicks" Mobility   Outcome Measure  Help needed turning from your back to your side while in a flat bed without using bedrails?: None Help needed moving from lying on your back to sitting on the side of a flat bed without using bedrails?: None Help needed moving to and from a bed to a chair (including a wheelchair)?: A Lot Help needed standing up from a chair using your arms (e.g., wheelchair or bedside chair)?: A Lot Help needed to walk in hospital room?: A Lot Help needed climbing 3-5 steps with a railing? : A Lot 6 Click Score: 16    End of Session Equipment Utilized During Treatment: Gait  belt Activity Tolerance: Patient tolerated treatment well Patient left: with call bell/phone within reach;in chair;with chair alarm set Nurse Communication: Mobility status PT Visit Diagnosis: Unsteadiness on feet (R26.81);Other abnormalities of gait and mobility (R26.89);Muscle weakness (generalized) (M62.81);Difficulty in walking, not elsewhere classified (R26.2);Other symptoms and signs involving the nervous system (R29.898)     Time: 1324-4010 PT Time Calculation (min) (ACUTE ONLY): 25 min  Charges:  $Gait Training: 8-22 mins $Therapeutic Exercise: 8-22 mins                     Raymond Gurney, PT, DPT Acute Rehabilitation Services  Pager: [phone] Office: [phone]    Jewel Baize 08/28/2020, 2:44 PM

## 2020-08-28 NOTE — Progress Notes (Signed)
PROGRESS NOTE    Jacob Cameron  WCH:[id number] DOB: [birth year] DOA: 08/12/2020 PCP: Patient, No Pcp Per   Brief Narrative:  65 y.o.malewithout medical history, s/p J&J covid vaccine April 2021 who presented with paresthesias in the right hand and feet with associated pain that has progressed over the previous few days now causing him to be unable to walk. MRI brain was nonacute, so he was admitted to [facility] for neurology evaluation on the advice of teleneurology with MRI C/T/L spine pending. Neurology has initiated IVIG empirically. Symptoms are stable and severely disabling. PT and OT recommend CIR at this time.He has undergone MRI on 2/6 with no acute findings. Refusing LP.  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  Mild spondylosis of cervical, thoracic and lumbar spine noted. -completed IVIGx5 days(ended 2/8) -On 2/17: Patient started on high dose of IV steroids 500 mg twice daily for 5 days-with improvement in symptoms.  NIF: -44 with good effort -Follow-up on Sjogren syndrome antibody panel, folate, B1, serum protein electrophoresis, urine heavy metal profile, plasma porphyrin, RF, ANA-ordered by neurology -Continue Neurontin 200mg  tid for better pain control.  Complete course of Plavix -Continue B12 supplementation due to low-normal level. 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 Given lack of significant symptoms, do not believe this is likely to have been present long enough or severe enough to precipitate acute polyneuropathy. - Diabetes coordinator and  dietitian consulted - Start metformin-at the time of discharge - continue with SSI coverage as needed  Neutrophilic leukocytosis: Afebrile.,  CRP: Sed rate: WNL.  Leukocytosis: Trended up from 15,000-18.5-20.9.  Expected rise in leukocyte count in the setting of high-dose steroids.  - Continue to monitor.  No indication of antibiotics at this time.  Hypertension: Stable -Continue amlodipine  Dyslipidemia: LDL 192. LFTs wnl.  - Continued onrosuvastatin as tolerated  Tobacco use:  - Recommend tobacco cessation  Elevated TSH: Mildly elevated at 5.311. T3, T4 wnl.  - Would repeat TSH in 4-6 weeks  DVT prophylaxis: Lovenox Code Status: Full code Family Communication:  None present at bedside.  Plan of care discussed with patient in length and he verbalized understanding and agreed with it. Disposition Plan: CIR  Consultants:   Neurology  Procedures:   *None  Antimicrobials:   None  Status is: Inpatient   Dispo: The patient is from: Home              Neuro allergy anticipated d/c is to: CIR              Anticipated d/c date is: More than 3 days              Patient currently is not medically stable to d/c.   Difficult to place patient No     Subjective: Patient seen and examined.  Tells me that he feels much better after starting steroids and his needlelike sensation/pain in his lower extremities has improved.  Slept well last night.  No new complaints.  No acute events overnight.  Denies shortness of breath.  Objective: Vitals:   08/28/20 0402 08/28/20 0500 08/28/20 0727 08/28/20 1232  BP:  106/75  95/69 105/70  Pulse: 88  88 87  Resp: 18  16 17   Temp: 98.1 F (36.7 C)  98.2 F (36.8 C) 98 F (36.7 C)  TempSrc: Oral  Oral Oral  SpO2: 95%  100% 97%  Weight:  71 kg    Height:       No intake or output data in the 24 hours ending 08/28/20 1246 Filed Weights   08/26/20 1615 08/27/20 0500 08/28/20 0500  Weight: 72 kg 71.9 kg 71 kg     Examination:  General exam: Appears calm and comfortable, on room air, communicating well respiratory system: Clear to auscultation. Respiratory effort normal. Cardiovascular system: S1 & S2 heard, RRR. No JVD, murmurs, rubs, gallops or clicks. No pedal edema. Gastrointestinal system: Abdomen is nondistended, soft and nontender. No organomegaly or masses felt. Normal bowel sounds heard. Central nervous system: Alert and oriented.  Following commands.  Power 4 out of 5 in bilateral upper and lower extremities. Skin: No rashes, lesions or ulcers Psychiatry: Judgement and insight appear normal. Mood & affect appropriate.    Data Reviewed: I have personally reviewed following labs and imaging studies  CBC: Recent Labs  Lab 08/23/20 0400 08/25/20 0408 08/26/20 0339 08/27/20 0338 08/28/20 0624  WBC 15.6* 15.0* 15.0* 18.5* 20.9*  HGB 14.0 13.4 13.0 13.7 12.3*  HCT 40.9 40.8 37.8* 41.5 37.5*  MCV 82.5 84.3 83.4 84.7 83.5  PLT 353 324 350 352 343   Basic Metabolic Panel: Recent Labs  Lab 08/24/20 0447 08/25/20 0408 08/26/20 0339 08/27/20 0338 08/28/20 0624  NA 131* 132* 133* 131* 129*  K 4.0 4.2 4.2 4.6 4.1  CL 101 103 100 102 100  CO2 24 23 24  20* 21*  GLUCOSE 132* 126* 127* 126* 205*  BUN 11 10 11 10 19   CREATININE 0.90 0.88 0.98 1.02 1.13  CALCIUM 8.6* 8.9 9.1 9.3 9.2   GFR: Estimated Creatinine Clearance: 60.1 mL/min (by C-G formula based on SCr of 1.13 mg/dL). Liver Function Tests: Recent Labs  Lab 08/22/20 0556 08/23/20 0400 08/24/20 0447 08/25/20 0408 08/26/20 0339  AST 20 15 19 19  32  ALT 32 20 24 22  39  ALKPHOS 43 29* 36* 23* 34*  BILITOT 0.7 0.6 0.5 0.2* 0.5  PROT 6.4* 5.8* 5.6* 5.5* 5.8*  ALBUMIN 3.5 4.0 3.4* 3.9 3.7   No results for input(s): LIPASE, AMYLASE in the last 168 hours. No results for input(s): AMMONIA in the last 168 hours. Coagulation Profile: No results for input(s): INR, PROTIME in the last 168 hours. Cardiac Enzymes: No results  for input(s): CKTOTAL, CKMB, CKMBINDEX, TROPONINI in the last 168 hours. BNP (last 3 results) No results for input(s): PROBNP in the last 8760 hours. HbA1C: No results for input(s): HGBA1C in the last 72 hours. CBG: Recent Labs  Lab 08/27/20 1215 08/27/20 2140 08/28/20 0115 08/28/20 0628 08/28/20 1152  GLUCAP 106* 283* 146* 196* 229*   Lipid Profile: No results for input(s): CHOL, HDL, LDLCALC, TRIG, CHOLHDL, LDLDIRECT in the last 72 hours. Thyroid Function Tests: No results for input(s): TSH, T4TOTAL, FREET4, T3FREE, THYROIDAB in the last 72 hours. Anemia Panel: Recent Labs    08/27/20 1029  FOLATE 15.3   Sepsis Labs: No results for input(s): PROCALCITON, LATICACIDVEN in the last 168 hours.  No results found for this or any previous visit (from the past 240 hour(s)).    Radiology Studies: No results found.  Scheduled Meds: . Chlorhexidine Gluconate Cloth  6 each Topical Daily  . cyanocobalamin  1,000 mcg Intramuscular Q7 days  . enoxaparin (LOVENOX) injection  40 mg Subcutaneous Q24H  . gabapentin  200 mg Oral TID  . heparin sodium (porcine)  1,000 Units Intracatheter Once  . insulin aspart  0-6 Units Subcutaneous TID WC  . polyethylene glycol  17 g Oral Daily  . rosuvastatin  5 mg Oral Daily   Continuous Infusions: . citrate dextrose    . methylPREDNISolone (SOLU-MEDROL) injection 500 mg (08/28/20 1053)     LOS: 14 days   Time spent: 35  minutes   Jen Eppinger Estill Cotta, MD [facility]  If 7PM-7AM, please contact night-coverage [email or web address] 08/28/2020, 12:46 PM

## 2020-08-28 NOTE — Progress Notes (Signed)
-  44 NIF, great effort.

## 2020-08-29 DIAGNOSIS — M792 Neuralgia and neuritis, unspecified: Secondary | ICD-10-CM | POA: Diagnosis not present

## 2020-08-29 LAB — GLUCOSE, CAPILLARY
Glucose-Capillary: 179 mg/dL — ABNORMAL HIGH (ref 70–99)
Glucose-Capillary: 184 mg/dL — ABNORMAL HIGH (ref 70–99)
Glucose-Capillary: 163 mg/dL — ABNORMAL HIGH (ref 70–99)
Glucose-Capillary: 251 mg/dL — ABNORMAL HIGH (ref 70–99)

## 2020-08-29 NOTE — Progress Notes (Signed)
PROGRESS NOTE    Jacob Cameron  FIE:[id number] DOB: [birth year] DOA: 08/12/2020 PCP: Patient, No Pcp Per   Brief Narrative:  65 y.o.malewithout medical history, s/p J&J covid vaccine April 2021 who presented with paresthesias in the right hand and feet with associated pain that has progressed over the previous few days now causing him to be unable to walk. MRI brain was nonacute, so he was admitted to [facility] for neurology evaluation on the advice of teleneurology with MRI C/T/L spine pending. Neurology has initiated IVIG empirically. Symptoms are stable and severely disabling. PT and OT recommend CIR at this time.He has undergone MRI on 2/6 with no acute findings. Refusing LP.  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  Mild spondylosis of cervical, thoracic and lumbar spine noted. -completed IVIGx5 days(ended 2/8), continue course of PLEX-Last day would be today 2/19. -On 2/17: Patient started on high dose of IV steroids 500 mg twice daily for 5 days-with improvement in symptoms.  NIF: -44 with good effort -Sjogren's syndrome antibody panel: Negative, folate: WNL, ANA, rheumatoid factor: Negative, follow-up on pending B1, serum protein electrophoresis, urine heavy metal profile, plasma porphyrin -Continue Neurontin 200mg  tid for better pain control.   -Continue B12 supplementation due to low-normal level. 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 Given lack of significant symptoms, do not believe this is likely to have been present long enough or severe enough to  precipitate acute polyneuropathy. - Diabetes coordinator and dietitian consulted - Start metformin-at the time of discharge - continue with SSI coverage as needed  Leukocytosis: Afebrile.,  CRP: Sed rate: WNL.  Leukocytosis: Trended up from 15,000-18.5-20.9.  Expected rise in leukocyte count in the setting of high-dose steroids.  - Continue to monitor.  No indication of antibiotics at this time.  Hypertension: Stable -Continue amlodipine  Dyslipidemia: LDL 192. LFTs wnl.  - Continued onrosuvastatin as tolerated  Tobacco use:  - Recommend tobacco cessation  Elevated TSH: Mildly elevated at 5.311. T3, T4 wnl.  - Would repeat TSH in 4-6 weeks  DVT prophylaxis: Lovenox Code Status: Full code Family Communication:  None present at bedside.  Plan of care discussed with patient in length and he verbalized understanding and agreed with it. Disposition Plan: CIR  Consultants:   Neurology  Procedures:   *None  Antimicrobials:   None  Status is: Inpatient   Dispo: The patient is from: Home              Neuro allergy anticipated d/c is to: CIR              Anticipated d/c date is: More than 3 days              Patient currently is not medically stable to d/c.   Difficult to place patient No     Subjective: Patient seen and examined.  Sitting comfortably on the bed.  Tells me that his needlelike sensation in his feet and legs has resolved and pain is much better with the steroids.  He is just concerned about his walking.  No new complaints.  Remained afebrile.  No acute  events overnight.  Objective: Vitals:   08/29/20 0004 08/29/20 0336 08/29/20 0750 08/29/20 1120  BP: 108/75 105/77 98/62 102/64  Pulse: 88 88 82 87  Resp: 18 18 17 16   Temp: 98.1 F (36.7 C) 98.1 F (36.7 C) 98 F (36.7 C) (!) 97.5 F (36.4 C)  TempSrc: Oral Oral Oral Oral  SpO2: 96% 99% 96% 97%  Weight:      Height:        Intake/Output Summary (Last 24 hours) at 08/29/2020 1200 Last data  filed at 08/29/2020 0945 Gross per 24 hour  Intake 305 ml  Output 750 ml  Net -445 ml   Filed Weights   08/26/20 1615 08/27/20 0500 08/28/20 0500  Weight: 72 kg 71.9 kg 71 kg    Examination:  General exam: Appears calm and comfortable, on room air, communicating well respiratory system: Clear to auscultation. Respiratory effort normal. Cardiovascular system: S1 & S2 heard, RRR. No JVD, murmurs, rubs, gallops or clicks. No pedal edema. Gastrointestinal system: Abdomen is nondistended, soft and nontender. No organomegaly or masses felt. Normal bowel sounds heard. Central nervous system: Alert and oriented.  Following commands.  Power 4 out of 5 in bilateral upper and lower extremities. Skin: No rashes, lesions or ulcers Psychiatry: Judgement and insight appear normal. Mood & affect appropriate.    Data Reviewed: I have personally reviewed following labs and imaging studies  CBC: Recent Labs  Lab 08/23/20 0400 08/25/20 0408 08/26/20 0339 08/27/20 0338 08/28/20 0624  WBC 15.6* 15.0* 15.0* 18.5* 20.9*  HGB 14.0 13.4 13.0 13.7 12.3*  HCT 40.9 40.8 37.8* 41.5 37.5*  MCV 82.5 84.3 83.4 84.7 83.5  PLT 353 324 350 352 343   Basic Metabolic Panel: Recent Labs  Lab 08/24/20 0447 08/25/20 0408 08/26/20 0339 08/27/20 0338 08/28/20 0624  NA 131* 132* 133* 131* 129*  K 4.0 4.2 4.2 4.6 4.1  CL 101 103 100 102 100  CO2 24 23 24  20* 21*  GLUCOSE 132* 126* 127* 126* 205*  BUN 11 10 11 10 19   CREATININE 0.90 0.88 0.98 1.02 1.13  CALCIUM 8.6* 8.9 9.1 9.3 9.2   GFR: Estimated Creatinine Clearance: 60.1 mL/min (by C-G formula based on SCr of 1.13 mg/dL). Liver Function Tests: Recent Labs  Lab 08/23/20 0400 08/24/20 0447 08/25/20 0408 08/26/20 0339  AST 15 19 19  32  ALT 20 24 22  39  ALKPHOS 29* 36* 23* 34*  BILITOT 0.6 0.5 0.2* 0.5  PROT 5.8* 5.6* 5.5* 5.8*  ALBUMIN 4.0 3.4* 3.9 3.7   No results for input(s): LIPASE, AMYLASE in the last 168 hours. No results for input(s):  AMMONIA in the last 168 hours. Coagulation Profile: No results for input(s): INR, PROTIME in the last 168 hours. Cardiac Enzymes: No results for input(s): CKTOTAL, CKMB, CKMBINDEX, TROPONINI in the last 168 hours. BNP (last 3 results) No results for input(s): PROBNP in the last 8760 hours. HbA1C: No results for input(s): HGBA1C in the last 72 hours. CBG: Recent Labs  Lab 08/28/20 1152 08/28/20 1543 08/28/20 2104 08/29/20 0624 08/29/20 1125  GLUCAP 229* 173* 292* 179* 184*   Lipid Profile: No results for input(s): CHOL, HDL, LDLCALC, TRIG, CHOLHDL, LDLDIRECT in the last 72 hours. Thyroid Function Tests: No results for input(s): TSH, T4TOTAL, FREET4, T3FREE, THYROIDAB in the last 72 hours. Anemia Panel: Recent Labs    08/27/20 1029  FOLATE 15.3   Sepsis Labs: No results for input(s): PROCALCITON, LATICACIDVEN in the last 168 hours.  No results found for  this or any previous visit (from the past 240 hour(s)).    Radiology Studies: No results found.  Scheduled Meds: . Chlorhexidine Gluconate Cloth  6 each Topical Daily  . cyanocobalamin  1,000 mcg Intramuscular Q7 days  . enoxaparin (LOVENOX) injection  40 mg Subcutaneous Q24H  . gabapentin  200 mg Oral TID  . heparin sodium (porcine)  1,000 Units Intracatheter Once  . insulin aspart  0-6 Units Subcutaneous TID WC  . polyethylene glycol  17 g Oral Daily  . rosuvastatin  5 mg Oral Daily   Continuous Infusions: . citrate dextrose    . methylPREDNISolone (SOLU-MEDROL) injection Stopped (08/29/20 0945)     LOS: 15 days   Time spent: 35  minutes   Tashae Inda Estill Cotta, MD [facility]  If 7PM-7AM, please contact night-coverage [email or web address] 08/29/2020, 12:00 PM

## 2020-08-29 NOTE — Plan of Care (Signed)

## 2020-08-29 NOTE — Progress Notes (Signed)
NIF greater than -40, good patient effort.

## 2020-08-30 DIAGNOSIS — M792 Neuralgia and neuritis, unspecified: Secondary | ICD-10-CM | POA: Diagnosis not present

## 2020-08-30 DIAGNOSIS — R202 Paresthesia of skin: Secondary | ICD-10-CM | POA: Diagnosis not present

## 2020-08-30 DIAGNOSIS — G610 Guillain-Barre syndrome: Secondary | ICD-10-CM | POA: Diagnosis not present

## 2020-08-30 LAB — GLUCOSE, CAPILLARY
Glucose-Capillary: 175 mg/dL — ABNORMAL HIGH (ref 70–99)
Glucose-Capillary: 137 mg/dL — ABNORMAL HIGH (ref 70–99)
Glucose-Capillary: 160 mg/dL — ABNORMAL HIGH (ref 70–99)
Glucose-Capillary: 178 mg/dL — ABNORMAL HIGH (ref 70–99)

## 2020-08-30 LAB — BASIC METABOLIC PANEL
Anion gap: 8 (ref 5–15)
BUN: 31 mg/dL — ABNORMAL HIGH (ref 8–23)
CO2: 23 mmol/L (ref 22–32)
Calcium: 8.2 mg/dL — ABNORMAL LOW (ref 8.9–10.3)
Chloride: 103 mmol/L (ref 98–111)
Creatinine, Ser: 1.02 mg/dL (ref 0.61–1.24)
GFR, Estimated: >60 mL/min (ref >60)
Glucose, Bld: 186 mg/dL — ABNORMAL HIGH (ref 70–99)
Potassium: 3.9 mmol/L (ref 3.5–5.1)
Sodium: 134 mmol/L — ABNORMAL LOW (ref 135–145)

## 2020-08-30 LAB — CBC
HCT: 33.8 % — ABNORMAL LOW (ref 39.0–52.0)
Hemoglobin: 11.5 g/dL — ABNORMAL LOW (ref 13.0–17.0)
MCH: 28.4 pg (ref 26.0–34.0)
MCHC: 34 g/dL (ref 30.0–36.0)
MCV: 83.5 fL (ref 80.0–100.0)
Platelets: 415 10*3/uL — ABNORMAL HIGH (ref 150–400)
RBC: 4.05 MIL/uL — ABNORMAL LOW (ref 4.22–5.81)
RDW: 13.5 % (ref 11.5–15.5)
WBC: 19.2 10*3/uL — ABNORMAL HIGH (ref 4.0–10.5)
nRBC: 0 % (ref 0.0–0.2)

## 2020-08-30 MED ORDER — PANTOPRAZOLE SODIUM 40 MG PO TBEC
40.0000 mg | DELAYED_RELEASE_TABLET | Freq: Every day | ORAL | Status: DC
  Administered 2020-08-30 – 2020-09-01 (×3): 40 mg via ORAL
  Filled 2020-08-30 (×3): qty 1

## 2020-08-30 NOTE — Plan of Care (Signed)

## 2020-08-30 NOTE — Progress Notes (Signed)
PROGRESS NOTE    Minor Iden  ZOX:[id number] DOB: 1956/03/02 DOA: 08/12/2020 PCP: Patient, No Pcp Per   Brief Narrative:  65 y.o.malewithout medical history, s/p J&J covid vaccine April 2021 who presented with paresthesias in the right hand and feet with associated pain that has progressed over the previous few days now causing him to be unable to walk. MRI brain was nonacute, so he was admitted to [facility] for neurology evaluation on the advice of teleneurology with MRI C/T/L spine pending. Neurology has initiated IVIG empirically. Symptoms are stable and severely disabling. PT and OT recommend CIR at this time.He has undergone MRI on 2/6 with no acute findings. Refusing LP.  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  Mild spondylosis of cervical, thoracic and lumbar spine noted. -completed IVIGx5 days(ended 2/8), continue course of PLEX-Last day would be on 2/21 -On 2/17: Patient started on high dose of IV steroids 500 mg twice daily for 5 days-with improvement in symptoms.  NIF: -44 with good effort -Sjogren's syndrome antibody panel: Negative, folate: WNL, ANA, rheumatoid factor: Negative, follow-up on pending B1, serum protein electrophoresis, urine heavy metal profile, plasma porphyrin -Continue Neurontin 200mg  tid for better pain control.   -Continue B12 supplementation due to low-normal level. 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 Given lack of significant symptoms, do not  believe this is likely to have been present long enough or severe enough to precipitate acute polyneuropathy. - Diabetes coordinator and dietitian consulted - Start metformin-at the time of discharge - continue with SSI coverage as needed  Leukocytosis: Afebrile.,  CRP: Sed rate: WNL.  Leukocytosis: Trended up from 15,000-18.5-20.9.  Expected rise in leukocyte count in the setting of high-dose steroids.  - Continue to monitor.  No indication of antibiotics at this time.  Hypertension: Stable -Continue amlodipine  Dyslipidemia: LDL 192. LFTs wnl.  - Continued onrosuvastatin as tolerated  Tobacco use:  - Recommend tobacco cessation  Elevated TSH: Mildly elevated at 5.311. T3, T4 wnl.  - Would repeat TSH in 4-6 weeks  DVT prophylaxis: Lovenox Code Status: Full code Family Communication:  None present at bedside.  Plan of care discussed with patient in length and he verbalized understanding and agreed with it. Disposition Plan: CIR  Consultants:   Neurology  Procedures:   *None  Antimicrobials:   None  Status is: Inpatient   Dispo: The patient is from: Home              Neuro allergy anticipated d/c is to: CIR              Anticipated d/c date is: More than 3 days              Patient currently is not medically stable to d/c.   Difficult to place patient No     Subjective: Patient seen and examined.  Reports improvement in his pain and paresthesia in legs.  Complaining of cold in his chest?  Denies chest pain, difficulty in breathing, runny nose, nasal congestion, cough, nausea, vomiting, lightheadedness or dizziness.  Objective: Vitals:   08/30/20 0500 08/30/20 0524 08/30/20 0801 08/30/20 1206  BP:  118/76 121/73 125/81  Pulse:  83 87 84  Resp:  18 18 18   Temp:  97.9 F (36.6 C) 97.6 F (36.4 C) 97.9 F (36.6 C)  TempSrc:  Oral Oral Oral  SpO2:  95% 95% 96%  Weight: 74.4 kg     Height:        Intake/Output Summary (Last 24 hours) at 08/30/2020  1351 Last data filed at 08/30/2020 1023 Gross per 24 hour  Intake 600 ml  Output 750 ml  Net -150 ml   Filed Weights   08/27/20 0500 08/28/20 0500 08/30/20 0500  Weight: 71.9 kg 71 kg 74.4 kg    Examination:  General exam: Appears calm and comfortable, on room air, communicating well respiratory system: Clear to auscultation. Respiratory effort normal. Cardiovascular system: S1 & S2 heard, RRR. No JVD, murmurs, rubs, gallops or clicks. No pedal edema. Gastrointestinal system: Abdomen is nondistended, soft and nontender. No organomegaly or masses felt. Normal bowel sounds heard. Central nervous system: Alert and oriented.  Following commands.  Power 5 out of 5 in bilateral upper and lower extremities.   Skin: No rashes, lesions or ulcers Psychiatry: Judgement and insight appear normal. Mood & affect appropriate.    Data Reviewed: I have personally reviewed following labs and imaging studies  CBC: Recent Labs  Lab 08/25/20 0408 08/26/20 0339 08/27/20 0338 08/28/20 0624 08/30/20 0500  WBC 15.0* 15.0* 18.5* 20.9* 19.2*  HGB 13.4 13.0 13.7 12.3* 11.5*  HCT 40.8 37.8* 41.5 37.5* 33.8*  MCV 84.3 83.4 84.7 83.5 83.5  PLT 324 350 352 343 415*   Basic Metabolic Panel: Recent Labs  Lab 08/25/20 0408 08/26/20 0339 08/27/20 0338 08/28/20 0624 08/30/20 0500  NA 132* 133* 131* 129* 134*  K 4.2 4.2 4.6 4.1 3.9  CL 103 100 102 100 103  CO2 23 24 20* 21* 23  GLUCOSE 126* 127* 126* 205* 186*  BUN 10 11 10 19  31*  CREATININE 0.88 0.98 1.02 1.13 1.02  CALCIUM 8.9 9.1 9.3 9.2 8.2*   GFR: Estimated Creatinine Clearance: 66.6 mL/min (by C-G formula based on SCr of 1.02 mg/dL). Liver Function Tests: Recent Labs  Lab 08/24/20 0447 08/25/20 0408 08/26/20 0339  AST 19 19 32  ALT 24 22 39  ALKPHOS 36* 23* 34*  BILITOT 0.5 0.2* 0.5  PROT 5.6* 5.5* 5.8*  ALBUMIN 3.4* 3.9 3.7   No results for input(s): LIPASE, AMYLASE in the last 168 hours. No results for input(s): AMMONIA in  the last 168 hours. Coagulation Profile: No results for input(s): INR, PROTIME in the last 168 hours. Cardiac Enzymes: No results for input(s): CKTOTAL, CKMB, CKMBINDEX, TROPONINI in the last 168 hours. BNP (last 3 results) No results for input(s): PROBNP in the last 8760 hours. HbA1C: No results for input(s): HGBA1C in the last 72 hours. CBG: Recent Labs  Lab 08/29/20 1125 08/29/20 1522 08/29/20 2146 08/30/20 0645 08/30/20 1206  GLUCAP 184* 163* 251* 175* 137*   Lipid Profile: No results for input(s): CHOL, HDL, LDLCALC, TRIG, CHOLHDL, LDLDIRECT in the last 72 hours. Thyroid Function Tests: No results for input(s): TSH, T4TOTAL, FREET4, T3FREE, THYROIDAB in the last 72 hours. Anemia Panel: No results for input(s): VITAMINB12, FOLATE, FERRITIN, TIBC, IRON, RETICCTPCT in the last 72 hours. Sepsis Labs: No results for input(s): PROCALCITON, LATICACIDVEN in the last 168 hours.  No results found for this or any previous visit (from the past 240  hour(s)).    Radiology Studies: No results found.  Scheduled Meds: . Chlorhexidine Gluconate Cloth  6 each Topical Daily  . cyanocobalamin  1,000 mcg Intramuscular Q7 days  . enoxaparin (LOVENOX) injection  40 mg Subcutaneous Q24H  . gabapentin  200 mg Oral TID  . heparin sodium (porcine)  1,000 Units Intracatheter Once  . insulin aspart  0-6 Units Subcutaneous TID WC  . pantoprazole  40 mg Oral Daily  . polyethylene glycol  17 g Oral Daily  . rosuvastatin  5 mg Oral Daily   Continuous Infusions: . citrate dextrose    . methylPREDNISolone (SOLU-MEDROL) injection 500 mg (08/30/20 1023)     LOS: 16 days   Time spent: 35  minutes   Danylah Holden Estill Cotta, MD [facility]  If 7PM-7AM, please contact night-coverage [email or web address] 08/30/2020, 1:51 PM

## 2020-08-30 NOTE — Progress Notes (Signed)
Patient performed NIF greater than <40 with great effort.

## 2020-08-30 NOTE — Progress Notes (Signed)
Neurology Progress Note  S: States he is feeling better. Less paraesthesias-just in hands and feet. Feels stronger. He is ambulating now with PT and use of walker. He is happy with his progress.   O: Current vital signs: BP 121/73 (BP Location: Right Arm)   Pulse 87   Temp 97.6 F (36.4 C) (Oral)   Resp 18   Ht 5\' 7"  (1.702 m)   Wt 74.4 kg   SpO2 95%   BMI 25.69 kg/m  Vital signs in last 24 hours: Temp:  [97.5 F (36.4 C)-98.3 F (36.8 C)] 97.6 F (36.4 C) (02/20 0801) Pulse Rate:  [82-87] 87 (02/20 0801) Resp:  [16-18] 18 (02/20 0801) BP: (102-121)/(64-77) 121/73 (02/20 0801) SpO2:  [94 %-97 %] 95 % (02/20 0801) Weight:  [74.4 kg] 74.4 kg (02/20 0500)  GENERAL: Awake, alert in NAD HEENT: Normocephalic and atraumatic LUNGS: Normal respiratory effort.  CV: RRR  ABDOMEN: Soft, nontender Ext: warm   NEURO:  Mental Status: AA&Ox3  Speech/Language: speech is without dysarthria.  No aphasia. Naming, repetition, fluency, and comprehension intact.  Cranial Nerves:  II: PERRL. Visual fields full.  III, IV, VI: EOMI. Eyelids elevate symmetrically.  V: Sensation is intact to light touch and symmetrical to face.  VII: Smile is symmetrical.  VIII: hearing intact to voice. IX, X: Palate elevates symmetrically. Phonation is normal.  XI: Shoulder shrug 5/5. XII: tongue is midline without fasciculations. Motor: 5/5 in RUE except 4+/5 wrist flexion and extension with hand grips 4+/5. BLE 4-/5 Tone: is normal and bulk is normal Sensation- Intact to light touch bilaterally UE/LE. Decreased but present to feet up to mid tibia. No extinction to light touch DSS.    Coordination: FTN intact bilaterally  DTRs: 0 throughout Gait- deferred  Medications  Current Facility-Administered Medications:  .  acetaminophen (TYLENOL) tablet 650 mg, 650 mg, Oral, Q4H PRN, 650 mg at 08/29/20 2233 **OR** acetaminophen (TYLENOL) 160 MG/5ML solution 650 mg, 650 mg, Per Tube, Q4H PRN **OR**  acetaminophen (TYLENOL) suppository 650 mg, 650 mg, Rectal, Q4H PRN, 2234, MD .  acetaminophen (TYLENOL) tablet 650 mg, 650 mg, Oral, Q4H PRN, Jonah Blue, MD .  benzonatate (TESSALON) capsule 100 mg, 100 mg, Oral, TID PRN, Caryl Pina, MD, 100 mg at 08/22/20 1741 .  Chlorhexidine Gluconate Cloth 2 % PADS 6 each, 6 each, Topical, Daily, 10/20/20, MD, 6 each at 08/29/20 [phone] .  citrate dextrose (ACD-A anticoagulant) solution 1,000 mL, 1,000 mL, Other, Continuous, Lindzen, Eric, MD .  cyanocobalamin ((VITAMIN B-12)) injection 1,000 mcg, 1,000 mcg, Intramuscular, Q7 days, 4765, MD, 1,000 mcg at 08/24/20 0809 .  diphenhydrAMINE (BENADRYL) capsule 25 mg, 25 mg, Oral, Q6H PRN, 08/26/20, MD .  enoxaparin (LOVENOX) injection 40 mg, 40 mg, Subcutaneous, Q24H, Caryl Pina, MD, 40 mg at 08/29/20 1633 .  gabapentin (NEURONTIN) capsule 200 mg, 200 mg, Oral, TID, 08/31/20, MD, 200 mg at 08/30/20 1016 .  heparin sodium (porcine) injection 1,000 Units, 1,000 Units, Intracatheter, Once, 09/01/20, MD .  hydrALAZINE (APRESOLINE) injection 5 mg, 5 mg, Intravenous, Q4H PRN, Caryl Pina, MD .  HYDROmorphone (DILAUDID) injection 1 mg, 1 mg, Intravenous, Q3H PRN, John Giovanni, Pratik D, DO .  insulin aspart (novoLOG) injection 0-6 Units, 0-6 Units, Subcutaneous, TID WC, Sherryll Burger, MD, 1 Units at 08/30/20 0701 .  lidocaine (PF) (XYLOCAINE) 1 % injection, , , PRN, Mir, 09/01/20, MD, 5 mL at 08/19/20 1549 .  methylPREDNISolone sodium succinate (SOLU-MEDROL) 500  mg in sodium chloride 0.9 % 50 mL IVPB, 500 mg, Intravenous, Q12H, Absher, Aram Candela, MD, Last Rate: 108 mL/hr at 08/30/20 1023, 500 mg at 08/30/20 1023 .  ondansetron (ZOFRAN) injection 4 mg, 4 mg, Intravenous, Q6H PRN, Jonah Blue, MD, 4 mg at 08/30/20 0527 .  oxyCODONE (Oxy IR/ROXICODONE) immediate release tablet 5 mg, 5 mg, Oral, Q4H PRN, Tyrone Nine, MD, 5 mg at 08/26/20 2111 .  polyethylene glycol  (MIRALAX / GLYCOLAX) packet 17 g, 17 g, Oral, Daily, Pahwani, Rinka R, MD, 17 g at 08/29/20 0905 .  rosuvastatin (CRESTOR) tablet 5 mg, 5 mg, Oral, Daily, Hazeline Junker B, MD, 5 mg at 08/30/20 1016 .  senna-docusate (Senokot-S) tablet 1 tablet, 1 tablet, Oral, QHS PRN, Jonah Blue, MD, 1 tablet at 08/17/20 0529 .  zolpidem (AMBIEN) tablet 5 mg, 5 mg, Oral, QHS PRN, Jerald Kief, MD   Assessment and Plan: 1. Complete PLEX, finished IVIG x 5 days.  2. Continue steroids as he seems to be improving.  3.  Na is better 4. Continue B12 supplementation. 5. F/up Sjogren's syndrome antibody panel, serum protein electrophoresis, urine heavy metals, plasma porphyrins, RF, ASA.  6. CIR 7. Continue periodic monitoring of respiratory, though stable. NIF over 40 yesteray. No evidence of respiratory difficulty.    Pt seen by Jimmye Norman, MSN, APN-BC/Nurse Practitioner/Neuro and later by MD. Note and plan to be edited as needed by MD.  Pager: [phone]  Attestation:  I saw this patient with the APP on 08/30/20, obtained pertinent aspects of the history, and performed relevant physical and neurological examination as documented. Also, I reviewed the available laboratory data and neuroimages, and other relevant tests/notes/procedures.  Patient reports improvement in paresthesia/dysesthesia and strength  My examination findings include improved strength in the following demonstrable ways: hand grip power much improved, leg elevation much improved, knee extension much improved, dorsiflexion mildly improved. Able to walk with a walker.  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  Meredeth Ide, MD

## 2020-08-30 NOTE — Plan of Care (Signed)
  Problem: Clinical Measurements: Goal: Ability to maintain clinical measurements within normal limits will improve Outcome: Progressing   Problem: Health Behavior/Discharge Planning: Goal: Ability to manage health-related needs will improve Outcome: Progressing   Problem: Health Behavior/Discharge Planning: Goal: Ability to manage health-related needs will improve Outcome: Progressing   

## 2020-08-31 DIAGNOSIS — R202 Paresthesia of skin: Secondary | ICD-10-CM | POA: Diagnosis not present

## 2020-08-31 DIAGNOSIS — M792 Neuralgia and neuritis, unspecified: Secondary | ICD-10-CM | POA: Diagnosis not present

## 2020-08-31 LAB — GLUCOSE, CAPILLARY
Glucose-Capillary: 174 mg/dL — ABNORMAL HIGH (ref 70–99)
Glucose-Capillary: 235 mg/dL — ABNORMAL HIGH (ref 70–99)
Glucose-Capillary: 163 mg/dL — ABNORMAL HIGH (ref 70–99)
Glucose-Capillary: 177 mg/dL — ABNORMAL HIGH (ref 70–99)

## 2020-08-31 LAB — HEAVY METALS PROFILE, URINE
Arsenic (Total),U: NOT DETECTED ug/L (ref 0–9)
Creatinine(Crt),U: 0.76 g/L (ref 0.30–3.00)
Inorg. As/Crt Ratio: UNDETERMINED ug/g creat
Lead, Rand Ur: NOT DETECTED ug/L (ref 0–49)
Mercury, Ur: NOT DETECTED ug/L (ref 0–19)

## 2020-08-31 NOTE — Progress Notes (Signed)
Physical Therapy Treatment Patient Details Name: Jacob Cameron MRN: [id number] DOB: 06/23/1956 Today's Date: 08/31/2020    History of Present Illness Pt is a 65 y.o. male presenting with R neck pain and radiculopathy down his R arm with bil leg pain, parasthesias, and weakness. Symptoms began in legs then arm then neck. MRI showing no acute change in head. Symptoms/exam consistent with Alene Mires however unable to confirm diagnosis due to lack of CSF and PNCV/EMG.    PT Comments    Pt tolerates treatment well, ambulating for increased distances this session. Pt continues to demonstrate impaired knee and hip stability when mobilizing, exacerbated by muscular fatigue. Pt will benefit from continued aggressive mobilization and acute PT POC to improve mobility quality and to reduce falls risk. PT continues to recommend CIR placement at this time.   Follow Up Recommendations  CIR;Supervision for mobility/OOB     Equipment Recommendations  Rolling walker with 5" wheels;3in1 (PT);Wheelchair (measurements PT);Wheelchair cushion (measurements PT)    Recommendations for Other Services       Precautions / Restrictions Precautions Precautions: Fall Precaution Comments: Translator helpful but pt speaks pretty good English Restrictions Weight Bearing Restrictions: No    Mobility  Bed Mobility Overal bed mobility: Needs Assistance Bed Mobility: Supine to Sit     Supine to sit: Supervision;HOB elevated     General bed mobility comments: pt utilizes rails to get to edge of bed    Transfers Overall transfer level: Needs assistance Equipment used: Rolling walker (2 wheeled) Transfers: Sit to/from Stand Sit to Stand: Min assist;Mod assist         General transfer comment: pt requires assistance to power up into standing. PT provides cues for use of hands to descend to chair  Ambulation/Gait Ambulation/Gait assistance: Min assist Gait Distance (Feet): 40 Feet (additional trial of  30') Assistive device: Rolling walker (2 wheeled) Gait Pattern/deviations: Step-through pattern Gait velocity: reduced Gait velocity interpretation: <1.31 ft/sec, indicative of household ambulator General Gait Details: pt with slowed step-through gait, pt with intermittent knee hyperextension bilaterally, impaired medial-lateral pelvic control   Stairs             Wheelchair Mobility    Modified Rankin (Stroke Patients Only) Modified Rankin (Stroke Patients Only) Pre-Morbid Rankin Score: No symptoms Modified Rankin: Moderately severe disability     Balance Overall balance assessment: Needs assistance Sitting-balance support: No upper extremity supported;Feet supported Sitting balance-Leahy Scale: Good     Standing balance support: Bilateral upper extremity supported Standing balance-Leahy Scale: Poor Standing balance comment: reliant on BUE support of RW                            Cognition Arousal/Alertness: Awake/alert Behavior During Therapy: WFL for tasks assessed/performed Overall Cognitive Status: Within Functional Limits for tasks assessed                                        Exercises      General Comments General comments (skin integrity, edema, etc.): VSS on RA      Pertinent Vitals/Pain Pain Assessment: No/denies pain    Home Living                      Prior Function            PT Goals (current goals can now be found  in the care plan section) Acute Rehab PT Goals Patient Stated Goal: to improve and eventually go home Progress towards PT goals: Progressing toward goals    Frequency    Min 3X/week      PT Plan Current plan remains appropriate    Co-evaluation              AM-PAC PT "6 Clicks" Mobility   Outcome Measure  Help needed turning from your back to your side while in a flat bed without using bedrails?: A Little Help needed moving from lying on your back to sitting on the  side of a flat bed without using bedrails?: A Little Help needed moving to and from a bed to a chair (including a wheelchair)?: A Little Help needed standing up from a chair using your arms (e.g., wheelchair or bedside chair)?: A Little Help needed to walk in hospital room?: A Little Help needed climbing 3-5 steps with a railing? : A Lot 6 Click Score: 17    End of Session Equipment Utilized During Treatment: Gait belt Activity Tolerance: Patient tolerated treatment well Patient left: in chair;with call bell/phone within reach;with chair alarm set Nurse Communication: Mobility status PT Visit Diagnosis: Unsteadiness on feet (R26.81);Other abnormalities of gait and mobility (R26.89);Muscle weakness (generalized) (M62.81);Difficulty in walking, not elsewhere classified (R26.2);Other symptoms and signs involving the nervous system (R29.898)     Time: 0937-1000 PT Time Calculation (min) (ACUTE ONLY): 23 min  Charges:  $Gait Training: 23-37 mins                     Arlyss Gandy, PT, DPT Acute Rehabilitation Pager: [phone]    Arlyss Gandy 08/31/2020, 12:30 PM

## 2020-08-31 NOTE — Progress Notes (Signed)
Patient performed greater than -40 on NIF. Good patient effort.

## 2020-08-31 NOTE — Plan of Care (Signed)

## 2020-08-31 NOTE — Progress Notes (Addendum)
Neurology Progress Note  Subjective: No acute overnight events.  Patient evaluated at bedside this morning.  He states his paresthesia is around 10-15% in both feet up to knees and hands (R>L).  No new complaints.  Objective: Current vital signs: BP 111/73 (BP Location: Right Arm)   Pulse 89   Temp 97.8 F (36.6 C) (Oral)   Resp 18   Ht 5\' 7"  (1.702 m)   Wt 74.4 kg   SpO2 93%   BMI 25.69 kg/m  Vital signs in last 24 hours: Temp:  [97.5 F (36.4 C)-98.5 F (36.9 C)] 97.8 F (36.6 C) (02/21 0806) Pulse Rate:  [79-89] 89 (02/21 0806) Resp:  [17-18] 18 (02/21 0806) BP: (109-125)/(70-81) 111/73 (02/21 0806) SpO2:  [93 %-96 %] 93 % (02/21 0806)   Social History:  reports that he has been smoking cigarettes. He has a 2.00 pack-year smoking history. He has never used smokeless tobacco. He reports that he does not drink alcohol and does not use drugs.  Physical Exam  Constitutional: Appears well-developed and well-nourished.  Psych: Affect appropriate to situation Eyes: No scleral injection HENT: No OP obstrucion MSK: no joint deformities.  Cardiovascular: Normal rate and regular rhythm.  Respiratory: Effort normal, non-labored breathing GI: Soft.  No distension. There is no tenderness.  Skin: WDI  Neuro: Mental Status: Patient is awake, alert, oriented to person, place, month, year, and situation Patient is able to give a clear and coherent history No signs of aphasia or neglect Cranial Nerves: II: Visual fields full. Pupils are equal, round, and reactive to light.  III,IV, VI: EOMI without ptosis or diploplia.  V: Facial sensation is symmetric to temperature VII: Facial movement is symmetric.  VIII: hearing is intact to voice X: Phonation normal, palate elevates symmetrically.` XI: Shoulder shrug is symmetric. XII: tongue is midline without atrophy or fasciculations.  Motor: 5 /5 in right upper extremity distally, 4+/5 wrist flexion and extension,5/5 hand grip.   Similar in left upper extremity except 4/5 handgrip. Tone is normal.  In lower extremities leg elevation without any drift, knee extension much improved bilaterally, plantar flexion 5/5 but weak dorsiflexion although improved. Sensory: Sensation is symmetrically reduced to above the knees to temperature and  reduced throughout to vibratory stimuli. UEs are slightly less affected with deficits to mid-forearm. Mid-chest and abdomen decreased sensation is noted as well.  Paresthesia has significantly improved although, only 10-15%% remains. Deep Tendon Reflexes: 0/4 and symmetric in the biceps, brachioradialis, triceps, patellae, and Achilles. Plantar responses are mute. Cerebellar: deferred Gait: Not attempted  Labs: I have reviewed labs in epic and the results pertinent to this consultation are: As noted above. Low B12 has been replaced.  Imaging: I have reviewed the images obtained.  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 -Symptoms/signs remains same after treatment with IVIG ( completed on 2/8) and 4 rounds of PLEX. -Overall clinical presentation consistent with GBS.  Vitamin B12 level 187 which is being replaced. -Hyponatremia noted, last Na at 134 on 2/20.  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 -There was minimal improvement with IVIG and 4 treatments of PLEX.  High suspicion of variant of GBS at this point and added IV steroids  on 2/17 to the treatment.  Significant improvement in paresthesia in lower extremities.  Although IV steroids are contraindicated in treatment of  GBS but looking at overall clinical picture and suspecting an variant of GBS it should be prudent to consider adding IV steroids.  Most likely patient would require outpatient IVIG regular treatments  as well. -Sjogren's syndrome antibody panel: Negative, folate: WNL, ANA and RF: Negative. -PT suggests [facility] inpatient rehabilitation -Negative inspiratory force NIF, -40 with good effort. -Patient has refused LP for further supporting diagnostic information regarding GBS -MRI brain without acute intracranial abnormality.  MRI of cervical, thoracic and lumbar spine without evidence for myelopathy.   Recommendations: -Continue IV steroids, Solu Medrol 500 mg twice daily-for a total of 5 days. -Continue correcting hyponatremia.  -Follow-up B1, serum protein electrophoresis, urine heavy metal profile, plasma porphyrins. - Complete course of PLEX - 5 rounds - Will need IPR to regain function/mobility - Continue periodic monitoring of respiratory status, though stable at this time with NIF -40 and no respiratory complaints -Neurology will continue to follow  Arnoldo Lenis, MD PGY-1, Resident  Attending Neurohospitalist Addendum Patient seen and examined with APP/Resident. Agree with the history and physical as documented above. Agree with the plan as documented, which I helped formulate. I have independently reviewed the chart, obtained history, review of systems and examined the patient.I have personally reviewed pertinent head/neck/spine imaging (CT/MRI). Please feel free to call with any questions. -- Milon Dikes, MD Neurologist [facility] Pager: [phone]

## 2020-08-31 NOTE — Progress Notes (Signed)
PROGRESS NOTE    Jacob Cameron  WEX:[id number] DOB: 05-31-1956 DOA: 08/12/2020 PCP: Patient, No Pcp Per   Brief Narrative:  65 y.o.malewithout medical history, s/p J&J covid vaccine April 2021 who presented with paresthesias in the right hand and feet with associated pain that has progressed over the previous few days now causing him to be unable to walk. MRI brain was nonacute, so he was admitted to [facility] for neurology evaluation on the advice of teleneurology with MRI C/T/L spine pending. Neurology has initiated IVIG empirically. Symptoms are stable and severely disabling. PT and OT recommend CIR at this time.He has undergone MRI on 2/6 with no acute findings. Refusing LP.  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  Mild spondylosis of cervical, thoracic and lumbar spine noted. -completed IVIGx5 days(ended 2/8), continue course of PLEX-Last day would be on 2/21 -On 2/17: Patient started on high dose of IV steroids 500 mg twice daily for 5 days-with improvement in symptoms.  NIF: -40 with good effort -Sjogren's syndrome antibody panel: Negative, folate: WNL, ANA, rheumatoid factor: Negative, follow-up on pending B1, serum protein electrophoresis, urine heavy metal profile, plasma porphyrin -Continue Neurontin 200mg  tid for better pain control.   -Continue B12 supplementation due to low-normal level. 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 Given lack of significant symptoms, do not believe this is likely to have been present  long enough or severe enough to precipitate acute polyneuropathy. - Diabetes coordinator and dietitian consulted - Start metformin-at the time of discharge - continue with SSI coverage as needed  Leukocytosis: Afebrile.,  CRP: Sed rate: WNL.  Leukocytosis:15-18.5-20.9-19.2.  Expected rise in leukocyte count in the setting of high-dose steroids.  - Continue to monitor.  No indication of antibiotics at this time.  Hypertension: Stable -Continue amlodipine  Dyslipidemia: LDL 192. LFTs wnl.  - Continued onrosuvastatin as tolerated  Tobacco use:  - Recommend tobacco cessation  Elevated TSH: Mildly elevated at 5.311. T3, T4 wnl.  - Would repeat TSH in 4-6 weeks  DVT prophylaxis: Lovenox Code Status: Full code Family Communication:  None present at bedside.  Plan of care discussed with patient in length and he verbalized understanding and agreed with it. Disposition Plan: CIR  Consultants:   Neurology  Procedures:   *None  Antimicrobials:   None  Status is: Inpatient   Dispo: The patient is from: Home              Neuro allergy anticipated d/c is to: CIR              Anticipated d/c date is: More than 3 days              Patient currently is not medically stable to d/c.   Difficult to place patient No     Subjective: Patient seen and examined.  Reports improvement in bilateral lower extremity pain and paresthesias.  Complaining of toothache however denies any fever, chills, problem with eating-requested for something stronger to help with the pain as Tylenol is not helping him.  No acute events overnight.  Denies  problem with  the breathing  Objective: Vitals:   08/30/20 2331 08/31/20 0411 08/31/20 0806 08/31/20 1206  BP: 114/76 109/70 111/73 102/68  Pulse: 83 79 89 78  Resp: 18 17 18 18   Temp: 98.3 F (36.8 C) 98.5 F (36.9 C) 97.8 F (36.6 C) 97.8 F (36.6 C)  TempSrc: Oral Oral Oral Oral  SpO2: 95% 94% 93% 95%  Weight:      Height:         Intake/Output Summary (Last 24 hours) at 08/31/2020 1345 Last data filed at 08/31/2020 0549 Gross per 24 hour  Intake 360 ml  Output 750 ml  Net -390 ml   Filed Weights   08/27/20 0500 08/28/20 0500 08/30/20 0500  Weight: 71.9 kg 71 kg 74.4 kg    Examination:  General exam: Appears calm and comfortable, on room air, communicating well respiratory system: Clear to auscultation. Respiratory effort normal. Cardiovascular system: S1 & S2 heard, RRR. No JVD, murmurs, rubs, gallops or clicks. No pedal edema. Gastrointestinal system: Abdomen is nondistended, soft and nontender. No organomegaly or masses felt. Normal bowel sounds heard. Central nervous system: Alert and oriented.  Following commands.  Power 5 out of 5 in bilateral upper and lower extremities.   Skin: No rashes, lesions or ulcers Psychiatry: Judgement and insight appear normal. Mood & affect appropriate.    Data Reviewed: I have personally reviewed following labs and imaging studies  CBC: Recent Labs  Lab 08/25/20 0408 08/26/20 0339 08/27/20 0338 08/28/20 0624 08/30/20 0500  WBC 15.0* 15.0* 18.5* 20.9* 19.2*  HGB 13.4 13.0 13.7 12.3* 11.5*  HCT 40.8 37.8* 41.5 37.5* 33.8*  MCV 84.3 83.4 84.7 83.5 83.5  PLT 324 350 352 343 415*   Basic Metabolic Panel: Recent Labs  Lab 08/25/20 0408 08/26/20 0339 08/27/20 0338 08/28/20 0624 08/30/20 0500  NA 132* 133* 131* 129* 134*  K 4.2 4.2 4.6 4.1 3.9  CL 103 100 102 100 103  CO2 23 24 20* 21* 23  GLUCOSE 126* 127* 126* 205* 186*  BUN 10 11 10 19  31*  CREATININE 0.88 0.98 1.02 1.13 1.02  CALCIUM 8.9 9.1 9.3 9.2 8.2*   GFR: Estimated Creatinine Clearance: 66.6 mL/min (by C-G formula based on SCr of 1.02 mg/dL). Liver Function Tests: Recent Labs  Lab 08/25/20 0408 08/26/20 0339  AST 19 32  ALT 22 39  ALKPHOS 23* 34*  BILITOT 0.2* 0.5  PROT 5.5* 5.8*  ALBUMIN 3.9 3.7   No results for input(s): LIPASE, AMYLASE in the last 168 hours. No results for  input(s): AMMONIA in the last 168 hours. Coagulation Profile: No results for input(s): INR, PROTIME in the last 168 hours. Cardiac Enzymes: No results for input(s): CKTOTAL, CKMB, CKMBINDEX, TROPONINI in the last 168 hours. BNP (last 3 results) No results for input(s): PROBNP in the last 8760 hours. HbA1C: No results for input(s): HGBA1C in the last 72 hours. CBG: Recent Labs  Lab 08/30/20 1206 08/30/20 1706 08/30/20 2103 08/31/20 0712 08/31/20 1204  GLUCAP 137* 160* 178* 174* 235*   Lipid Profile: No results for input(s): CHOL, HDL, LDLCALC, TRIG, CHOLHDL, LDLDIRECT in the last 72 hours. Thyroid Function Tests: No results for input(s): TSH, T4TOTAL, FREET4, T3FREE, THYROIDAB in the last 72 hours. Anemia Panel: No results for input(s): VITAMINB12, FOLATE, FERRITIN, TIBC, IRON, RETICCTPCT in the last 72 hours. Sepsis Labs: No results for input(s): PROCALCITON, LATICACIDVEN in the last 168 hours.  No results found for this or any previous visit (from the past 240 hour(s)).  Radiology Studies: No results found.  Scheduled Meds: . Chlorhexidine Gluconate Cloth  6 each Topical Daily  . cyanocobalamin  1,000 mcg Intramuscular Q7 days  . enoxaparin (LOVENOX) injection  40 mg Subcutaneous Q24H  . gabapentin  200 mg Oral TID  . heparin sodium (porcine)  1,000 Units Intracatheter Once  . insulin aspart  0-6 Units Subcutaneous TID WC  . pantoprazole  40 mg Oral Daily  . polyethylene glycol  17 g Oral Daily  . rosuvastatin  5 mg Oral Daily   Continuous Infusions: . citrate dextrose    . methylPREDNISolone (SOLU-MEDROL) injection 500 mg (08/31/20 0825)     LOS: 17 days   Time spent: 35  minutes   Imunique Samad Estill Cotta, MD [facility]  If 7PM-7AM, please contact night-coverage [email or web address] 08/31/2020, 1:45 PM

## 2020-09-01 ENCOUNTER — Inpatient Hospital Stay (HOSPITAL_COMMUNITY)
Admission: RE | Admit: 2020-09-01 | Discharge: 2020-09-18 | DRG: 074 | Disposition: A | Discharge status: Home Health | Payer: Medicare Other | Source: Intra-hospital | Attending: Physical Medicine & Rehabilitation | Admitting: Physical Medicine & Rehabilitation

## 2020-09-01 ENCOUNTER — Other Ambulatory Visit: Payer: Self-pay

## 2020-09-01 ENCOUNTER — Encounter (HOSPITAL_COMMUNITY): Payer: Self-pay | Admitting: Physical Medicine & Rehabilitation

## 2020-09-01 DIAGNOSIS — D62 Acute posthemorrhagic anemia: Secondary | ICD-10-CM | POA: Diagnosis present

## 2020-09-01 DIAGNOSIS — T361X5A Adverse effect of cephalosporins and other beta-lactam antibiotics, initial encounter: Secondary | ICD-10-CM | POA: Diagnosis not present

## 2020-09-01 DIAGNOSIS — K0889 Other specified disorders of teeth and supporting structures: Secondary | ICD-10-CM | POA: Diagnosis present

## 2020-09-01 DIAGNOSIS — G603 Idiopathic progressive neuropathy: Secondary | ICD-10-CM

## 2020-09-01 DIAGNOSIS — E119 Type 2 diabetes mellitus without complications: Secondary | ICD-10-CM

## 2020-09-01 DIAGNOSIS — G650 Sequelae of Guillain-Barre syndrome: Principal | ICD-10-CM | POA: Diagnosis present

## 2020-09-01 DIAGNOSIS — E871 Hypo-osmolality and hyponatremia: Secondary | ICD-10-CM | POA: Diagnosis present

## 2020-09-01 DIAGNOSIS — D72829 Elevated white blood cell count, unspecified: Secondary | ICD-10-CM | POA: Diagnosis present

## 2020-09-01 DIAGNOSIS — F17210 Nicotine dependence, cigarettes, uncomplicated: Secondary | ICD-10-CM | POA: Diagnosis present

## 2020-09-01 DIAGNOSIS — G629 Polyneuropathy, unspecified: Secondary | ICD-10-CM

## 2020-09-01 DIAGNOSIS — R509 Fever, unspecified: Secondary | ICD-10-CM | POA: Diagnosis not present

## 2020-09-01 DIAGNOSIS — R197 Diarrhea, unspecified: Secondary | ICD-10-CM | POA: Diagnosis not present

## 2020-09-01 DIAGNOSIS — E538 Deficiency of other specified B group vitamins: Secondary | ICD-10-CM | POA: Diagnosis present

## 2020-09-01 DIAGNOSIS — M792 Neuralgia and neuritis, unspecified: Secondary | ICD-10-CM

## 2020-09-01 DIAGNOSIS — G6289 Other specified polyneuropathies: Secondary | ICD-10-CM | POA: Diagnosis present

## 2020-09-01 LAB — PROTEIN ELECTROPHORESIS, SERUM
A/G Ratio: 2.6 — ABNORMAL HIGH (ref 0.7–1.7)
Albumin ELP: 3.7 g/dL (ref 2.9–4.4)
Alpha-1-Globulin: 0.2 g/dL (ref 0.0–0.4)
Alpha-2-Globulin: 0.4 g/dL (ref 0.4–1.0)
Beta Globulin: 0.5 g/dL — ABNORMAL LOW (ref 0.7–1.3)
Gamma Globulin: 0.3 g/dL — ABNORMAL LOW (ref 0.4–1.8)
Globulin, Total: 1.4 g/dL — ABNORMAL LOW (ref 2.2–3.9)
Total Protein ELP: 5.1 g/dL — ABNORMAL LOW (ref 6.0–8.5)

## 2020-09-01 LAB — BASIC METABOLIC PANEL
Anion gap: 10 (ref 5–15)
BUN: 29 mg/dL — ABNORMAL HIGH (ref 8–23)
CO2: 24 mmol/L (ref 22–32)
Calcium: 8.5 mg/dL — ABNORMAL LOW (ref 8.9–10.3)
Chloride: 100 mmol/L (ref 98–111)
Creatinine, Ser: 0.96 mg/dL (ref 0.61–1.24)
GFR, Estimated: >60 mL/min (ref >60)
Glucose, Bld: 189 mg/dL — ABNORMAL HIGH (ref 70–99)
Potassium: 4.3 mmol/L (ref 3.5–5.1)
Sodium: 134 mmol/L — ABNORMAL LOW (ref 135–145)

## 2020-09-01 LAB — GLUCOSE, CAPILLARY
Glucose-Capillary: 186 mg/dL — ABNORMAL HIGH (ref 70–99)
Glucose-Capillary: 180 mg/dL — ABNORMAL HIGH (ref 70–99)
Glucose-Capillary: 145 mg/dL — ABNORMAL HIGH (ref 70–99)
Glucose-Capillary: 114 mg/dL — ABNORMAL HIGH (ref 70–99)

## 2020-09-01 LAB — POCT I-STAT, CHEM 8
BUN: 28 mg/dL — ABNORMAL HIGH (ref 8–23)
Calcium, Ion: 1.01 mmol/L — ABNORMAL LOW (ref 1.15–1.40)
Chloride: 101 mmol/L (ref 98–111)
Creatinine, Ser: 0.8 mg/dL (ref 0.61–1.24)
Glucose, Bld: 127 mg/dL — ABNORMAL HIGH (ref 70–99)
HCT: 30 % — ABNORMAL LOW (ref 39.0–52.0)
Hemoglobin: 10.2 g/dL — ABNORMAL LOW (ref 13.0–17.0)
Potassium: 3.6 mmol/L (ref 3.5–5.1)
Sodium: 137 mmol/L (ref 135–145)
TCO2: 24 mmol/L (ref 22–32)

## 2020-09-01 LAB — CBC
HCT: 31.3 % — ABNORMAL LOW (ref 39.0–52.0)
Hemoglobin: 10.7 g/dL — ABNORMAL LOW (ref 13.0–17.0)
MCH: 28.3 pg (ref 26.0–34.0)
MCHC: 34.2 g/dL (ref 30.0–36.0)
MCV: 82.8 fL (ref 80.0–100.0)
Platelets: 373 10*3/uL (ref 150–400)
RBC: 3.78 MIL/uL — ABNORMAL LOW (ref 4.22–5.81)
RDW: 13.5 % (ref 11.5–15.5)
WBC: 18.4 10*3/uL — ABNORMAL HIGH (ref 4.0–10.5)
nRBC: 0 % (ref 0.0–0.2)

## 2020-09-01 MED ORDER — GUAIFENESIN-DM 100-10 MG/5ML PO SYRP: 5.0000 mL | ORAL_SOLUTION | Freq: Four times a day (QID) | ORAL | Status: DC | PRN

## 2020-09-01 MED ORDER — CALCIUM CARBONATE ANTACID 500 MG PO CHEW
CHEWABLE_TABLET | ORAL | Status: AC
  Filled 2020-09-01: qty 2

## 2020-09-01 MED ORDER — BENZONATATE 100 MG PO CAPS: 100.0000 mg | ORAL_CAPSULE | Freq: Three times a day (TID) | ORAL | Status: DC | PRN

## 2020-09-01 MED ORDER — PANTOPRAZOLE SODIUM 40 MG PO TBEC
40.0000 mg | DELAYED_RELEASE_TABLET | Freq: Every day | ORAL | Status: DC
  Administered 2020-09-02 – 2020-09-18 (×17): 40 mg via ORAL
  Filled 2020-09-01 (×17): qty 1

## 2020-09-01 MED ORDER — SODIUM CHLORIDE 0.9 % IV SOLN
INTRAVENOUS | Status: AC
  Filled 2020-09-01 (×3): qty 200

## 2020-09-01 MED ORDER — OXYCODONE HCL 5 MG PO TABS: 5.0000 mg | ORAL_TABLET | ORAL | Status: DC | PRN

## 2020-09-01 MED ORDER — METFORMIN HCL 500 MG PO TABS: 500.0000 mg | ORAL_TABLET | Freq: Two times a day (BID) | ORAL | 11 refills | Status: DC

## 2020-09-01 MED ORDER — ROSUVASTATIN CALCIUM 5 MG PO TABS: 5.0000 mg | ORAL_TABLET | Freq: Every day | ORAL | 0 refills | Status: DC

## 2020-09-01 MED ORDER — IBUPROFEN 400 MG PO TABS: 600.0000 mg | ORAL_TABLET | ORAL | Status: DC | PRN

## 2020-09-01 MED ORDER — CYANOCOBALAMIN 1000 MCG/ML IJ SOLN
1000.0000 ug | INTRAMUSCULAR | Status: AC
  Administered 2020-09-07 – 2020-09-14 (×2): 1000 ug via INTRAMUSCULAR
  Filled 2020-09-01 (×2): qty 1

## 2020-09-01 MED ORDER — CALCIUM GLUCONATE-NACL 2-0.675 GM/100ML-% IV SOLN
2.0000 g | Freq: Once | INTRAVENOUS | Status: AC
  Administered 2020-09-01: 2000 mg via INTRAVENOUS
  Filled 2020-09-01 (×2): qty 100

## 2020-09-01 MED ORDER — TRAZODONE HCL 50 MG PO TABS
25.0000 mg | ORAL_TABLET | Freq: Every evening | ORAL | Status: DC | PRN
  Administered 2020-09-01: 25 mg via ORAL
  Administered 2020-09-02 – 2020-09-04 (×3): 50 mg via ORAL
  Filled 2020-09-01 (×4): qty 1

## 2020-09-01 MED ORDER — ACETAMINOPHEN 325 MG PO TABS: 650.0000 mg | ORAL_TABLET | ORAL | Status: DC | PRN

## 2020-09-01 MED ORDER — GABAPENTIN 100 MG PO CAPS
200.0000 mg | ORAL_CAPSULE | Freq: Three times a day (TID) | ORAL | Status: DC
  Administered 2020-09-01 – 2020-09-02 (×2): 200 mg via ORAL
  Filled 2020-09-01 (×2): qty 2

## 2020-09-01 MED ORDER — IBUPROFEN 600 MG PO TABS
600.0000 mg | ORAL_TABLET | ORAL | Status: DC | PRN
  Administered 2020-09-02 – 2020-09-05 (×6): 600 mg via ORAL
  Filled 2020-09-01 (×4): qty 1
  Filled 2020-09-01 (×2): qty 2
  Filled 2020-09-01 (×2): qty 1

## 2020-09-01 MED ORDER — POLYETHYLENE GLYCOL 3350 17 G PO PACK
17.0000 g | PACK | Freq: Every day | ORAL | Status: DC
  Administered 2020-09-02 – 2020-09-18 (×16): 17 g via ORAL
  Filled 2020-09-01 (×17): qty 1

## 2020-09-01 MED ORDER — ACETAMINOPHEN 325 MG PO TABS
325.0000 mg | ORAL_TABLET | ORAL | Status: DC | PRN
  Administered 2020-09-01 – 2020-09-11 (×3): 650 mg via ORAL
  Filled 2020-09-01 (×3): qty 2

## 2020-09-01 MED ORDER — POLYETHYLENE GLYCOL 3350 17 G PO PACK: 17.0000 g | PACK | Freq: Every day | ORAL | 0 refills | Status: AC

## 2020-09-01 MED ORDER — AMOXICILLIN-POT CLAVULANATE 875-125 MG PO TABS: 1.0000 | ORAL_TABLET | Freq: Two times a day (BID) | ORAL | 0 refills | Status: DC

## 2020-09-01 MED ORDER — CHLORHEXIDINE GLUCONATE 0.12 % MT SOLN
15.0000 mL | Freq: Four times a day (QID) | OROMUCOSAL | Status: DC
  Administered 2020-09-01 – 2020-09-18 (×29): 15 mL via OROMUCOSAL
  Filled 2020-09-01 (×40): qty 15

## 2020-09-01 MED ORDER — KETOROLAC TROMETHAMINE 10 MG PO TABS: 10.0000 mg | ORAL_TABLET | Freq: Four times a day (QID) | ORAL | Status: DC | PRN

## 2020-09-01 MED ORDER — GABAPENTIN 100 MG PO CAPS: 200.0000 mg | ORAL_CAPSULE | Freq: Three times a day (TID) | ORAL | 0 refills | Status: DC

## 2020-09-01 MED ORDER — BISACODYL 10 MG RE SUPP: 10.0000 mg | Freq: Every day | RECTAL | Status: DC | PRN

## 2020-09-01 MED ORDER — SENNOSIDES-DOCUSATE SODIUM 8.6-50 MG PO TABS: 1.0000 | ORAL_TABLET | Freq: Every evening | ORAL | 0 refills | Status: DC | PRN

## 2020-09-01 MED ORDER — CALCIUM CARBONATE ANTACID 500 MG PO CHEW: 2.0000 | CHEWABLE_TABLET | ORAL | Status: DC

## 2020-09-01 MED ORDER — DIPHENHYDRAMINE HCL 25 MG PO CAPS
25.0000 mg | ORAL_CAPSULE | Freq: Four times a day (QID) | ORAL | Status: DC | PRN
Start: 2020-09-01 — End: 2020-09-01

## 2020-09-01 MED ORDER — HEPARIN SODIUM (PORCINE) 1000 UNIT/ML IJ SOLN: 1000.0000 [IU] | Freq: Once | INTRAMUSCULAR | Status: DC

## 2020-09-01 MED ORDER — POLYETHYLENE GLYCOL 3350 17 G PO PACK
17.0000 g | PACK | Freq: Every day | ORAL | Status: DC | PRN
  Administered 2020-09-18: 17 g via ORAL

## 2020-09-01 MED ORDER — CALCIUM CARBONATE ANTACID 500 MG PO CHEW
2.0000 | CHEWABLE_TABLET | ORAL | Status: AC
  Administered 2020-09-01 (×2): 400 mg via ORAL
  Filled 2020-09-01: qty 2

## 2020-09-01 MED ORDER — CALCIUM GLUCONATE-NACL 2-0.675 GM/100ML-% IV SOLN: 2.0000 g | Freq: Once | INTRAVENOUS | Status: DC

## 2020-09-01 MED ORDER — IBUPROFEN 200 MG PO TABS
600.0000 mg | ORAL_TABLET | ORAL | Status: DC | PRN
  Administered 2020-09-01 (×2): 600 mg via ORAL
  Filled 2020-09-01 (×2): qty 3

## 2020-09-01 MED ORDER — SENNOSIDES-DOCUSATE SODIUM 8.6-50 MG PO TABS: 1.0000 | ORAL_TABLET | Freq: Every evening | ORAL | Status: DC | PRN

## 2020-09-01 MED ORDER — ENOXAPARIN SODIUM 40 MG/0.4ML ~~LOC~~ SOLN
40.0000 mg | SUBCUTANEOUS | Status: DC
  Administered 2020-09-01 – 2020-09-15 (×15): 40 mg via SUBCUTANEOUS
  Filled 2020-09-01 (×15): qty 0.4

## 2020-09-01 MED ORDER — DIPHENHYDRAMINE HCL 25 MG PO CAPS
25.0000 mg | ORAL_CAPSULE | Freq: Four times a day (QID) | ORAL | Status: DC | PRN
  Administered 2020-09-01: 25 mg via ORAL

## 2020-09-01 MED ORDER — FLEET ENEMA 7-19 GM/118ML RE ENEM: 1.0000 | ENEMA | Freq: Once | RECTAL | Status: DC | PRN

## 2020-09-01 MED ORDER — ROSUVASTATIN CALCIUM 5 MG PO TABS
5.0000 mg | ORAL_TABLET | Freq: Every day | ORAL | Status: DC
  Administered 2020-09-02 – 2020-09-18 (×17): 5 mg via ORAL
  Filled 2020-09-01 (×17): qty 1

## 2020-09-01 MED ORDER — PROCHLORPERAZINE 25 MG RE SUPP
12.5000 mg | Freq: Four times a day (QID) | RECTAL | Status: DC | PRN
Start: 2020-09-01 — End: 2020-09-18

## 2020-09-01 MED ORDER — INSULIN ASPART 100 UNIT/ML ~~LOC~~ SOLN
0.0000 [IU] | Freq: Three times a day (TID) | SUBCUTANEOUS | Status: DC
  Administered 2020-09-02 – 2020-09-10 (×5): 1 [IU] via SUBCUTANEOUS

## 2020-09-01 MED ORDER — AMOXICILLIN-POT CLAVULANATE 875-125 MG PO TABS
1.0000 | ORAL_TABLET | Freq: Two times a day (BID) | ORAL | Status: DC
  Administered 2020-09-01 – 2020-09-02 (×2): 1 via ORAL
  Filled 2020-09-01 (×2): qty 1

## 2020-09-01 MED ORDER — ACETAMINOPHEN 325 MG PO TABS
ORAL_TABLET | ORAL | Status: AC
  Filled 2020-09-01: qty 2

## 2020-09-01 MED ORDER — HEPARIN SODIUM (PORCINE) 1000 UNIT/ML IJ SOLN
INTRAMUSCULAR | Status: AC
  Filled 2020-09-01: qty 3

## 2020-09-01 MED ORDER — ALUM & MAG HYDROXIDE-SIMETH 200-200-20 MG/5ML PO SUSP: 30.0000 mL | ORAL | Status: DC | PRN

## 2020-09-01 MED ORDER — DIPHENHYDRAMINE HCL 25 MG PO CAPS: 25.0000 mg | ORAL_CAPSULE | Freq: Four times a day (QID) | ORAL | Status: DC | PRN

## 2020-09-01 MED ORDER — PROCHLORPERAZINE EDISYLATE 10 MG/2ML IJ SOLN: 5.0000 mg | Freq: Four times a day (QID) | INTRAMUSCULAR | Status: DC | PRN

## 2020-09-01 MED ORDER — DIPHENHYDRAMINE HCL 25 MG PO CAPS
ORAL_CAPSULE | ORAL | Status: AC
  Filled 2020-09-01: qty 1

## 2020-09-01 MED ORDER — PROCHLORPERAZINE MALEATE 5 MG PO TABS
5.0000 mg | ORAL_TABLET | Freq: Four times a day (QID) | ORAL | Status: DC | PRN
  Administered 2020-09-06 – 2020-09-10 (×4): 10 mg via ORAL
  Filled 2020-09-01 (×4): qty 2

## 2020-09-01 MED ORDER — OXYCODONE HCL 5 MG PO TABS
5.0000 mg | ORAL_TABLET | ORAL | Status: DC | PRN
Start: 2020-09-01 — End: 2020-09-16
  Administered 2020-09-01 – 2020-09-15 (×22): 5 mg via ORAL
  Filled 2020-09-01 (×23): qty 1

## 2020-09-01 MED ORDER — CHLORHEXIDINE GLUCONATE CLOTH 2 % EX PADS
6.0000 | MEDICATED_PAD | Freq: Every day | CUTANEOUS | Status: DC
  Administered 2020-09-02: 6 via TOPICAL

## 2020-09-01 MED ORDER — ACD FORMULA A 0.73-2.45-2.2 GM/100ML VI SOLN: 1000.0000 mL | Status: DC

## 2020-09-01 MED ORDER — SODIUM CHLORIDE 0.9 % IV SOLN: INTRAVENOUS | Status: DC

## 2020-09-01 MED ORDER — DIPHENHYDRAMINE HCL 12.5 MG/5ML PO ELIX: 12.5000 mg | ORAL_SOLUTION | Freq: Four times a day (QID) | ORAL | Status: DC | PRN

## 2020-09-01 MED ORDER — ACD FORMULA A 0.73-2.45-2.2 GM/100ML VI SOLN
1000.0000 mL | Status: DC
  Administered 2020-09-01: 1000 mL
  Filled 2020-09-01 (×6): qty 1000

## 2020-09-01 MED ORDER — ACD FORMULA A 0.73-2.45-2.2 GM/100ML VI SOLN
Status: AC
  Filled 2020-09-01: qty 500

## 2020-09-01 NOTE — Discharge Summary (Signed)
Physician Discharge Summary  Jakhi Dishman ZOX:[id number] DOB: Aug 22, 1955 DOA: 08/12/2020  PCP: Patient, No Pcp Per  Admit date: 08/12/2020 Discharge date: 09/01/2020  Admitted From: Home Disposition:  CIR  Recommendations for Outpatient Follow-up:  1. Follow-up with PCP outpatient 2. Follow-up with neurology outpatient 3. Follow-up on pending labs  Home Health: None Equipment/Devices: None Discharge Condition: Stable CODE STATUS: Full code Diet recommendation: Low-sodium/carb consistent diet  Brief/Interim Summary:  65 y.o.malewithout medical history, s/p J&J covid vaccine April 2021 who presented with paresthesias in the right hand and feet with associated pain that has progressed over the previous few days now causing him to be unable to walk. MRI brain was nonacute, so he was admitted to [facility] for neurology evaluation on the advice of teleneurology with MRI C/T/L spine pending. Neurology has initiated IVIG empirically. Symptoms are stable and severely disabling. PT and OT recommend CIR at this time.He has undergone MRI on 2/6 with no acute findings. Refused LP.  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  Mild spondylosis of cervical, thoracic and lumbar spine noted. -completed IVIGx5 days(ended 2/8), continue course of PLEX-Last day would be on 2/22 -Finished course of high-dose steroids 500 mg IV twice daily for 5 days with improvement in symptoms.  NIF: -40 with good effort -Sjogren's syndrome antibody panel: Negative, folate: WNL, ANA, rheumatoid factor: Negative, urine heavy metal profile: Negative, follow-up on pending B1, serum protein electrophoresis,  plasma porphyrin -Continued Neurontin  tid for better pain control.   -Patient was given IV B12 supplementation due to low normal B12 level -PT/OT recommended CIR  -Appreciate neurology assistance-recommended outpatient  follow-up -Needs IR consult for catheter removal once he finishes his PLEX course (discussed with CIR nurse-can be done at CIR with out extra bill/payment)  Tooth ache:  -Patient complaining of severe left upper toothache with no improvement with as needed pain medications. -Ordered Augmentin twice daily for 10 days.  Follow-up with dentist outpatient once discharged from CIR.  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 Given lack of significant symptoms, do not believe this is likely to have been present long enough or severe enough to precipitate acute polyneuropathy. - Started metformin-at the time of discharge - continued with SSI coverage as needed  Leukocytosis: Afebrile.,  CRP: Sed rate: WNL.   -  Expected rise in leukocyte count in the setting of high-dose steroids.  - No indication of antibiotics at this time.  Hypertension: Stable -Continued amlodipine  Dyslipidemia: LDL 192. LFTs wnl.  - Continuedd onrosuvastatinas tolerated  Tobacco use:  - Recommend tobacco cessation  Elevated TSH: Mildly elevated at 5.311. T3, T4 wnl.  -Recommend to repeat TSH in 4-6 weeks  Patient is stable for discharge to CIR today.  Needs last dose of PLEX (today or tomorrow) and his catheter needs to come out by IR (CIR is aware & agreed to accept the patient today).  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   gabapentin 100 MG capsule Commonly known as: NEURONTIN Take 2 capsules (200 mg total) by mouth 3 (three) times daily.   HYDROcodone-acetaminophen 5-325 MG tablet Commonly known as: Norco Take 1 tablet by mouth every 6 (six) hours as needed.   metFORMIN 500 MG tablet Commonly known as: Glucophage Take 1 tablet (500 mg total) by mouth 2 (two) times daily with a meal.   naproxen 375 MG tablet Commonly known as: NAPROSYN Take 1 tablet twice daily as needed for muscle pain.   polyethylene glycol 17 g packet Commonly known as: MIRALAX / GLYCOLAX Take 17 g by mouth daily. Start taking on: September 02, 2020   rosuvastatin 5 MG tablet Commonly known as: CRESTOR Take 1 tablet (5 mg total) by mouth daily. Start taking on: September 02, 2020   senna-docusate 8.6-50 MG tablet Commonly known as: Senokot-S Take 1 tablet by mouth at bedtime as needed for mild constipation.       Follow-up Information    Myra RudeSchmitz, Jeremy E, MD. Go today.   Specialties: Family Medicine, Emergency Medicine, Sports Medicine Why: 2:30PM as scheduled [address] [place] [address] [phone]              No Known Allergies  Consultations:  Neurology   Procedures/Studies: MR BRAIN WO CONTRAST  Result Date: 08/13/2020 CLINICAL DATA:  Neuro deficit, acute stroke suspected. EXAM: MRI HEAD WITHOUT CONTRAST TECHNIQUE: Multiplanar, multiecho pulse sequences of the brain and surrounding structures were obtained without intravenous contrast. COMPARISON:  None. FINDINGS: Brain: No acute infarction,  hemorrhage, hydrocephalus, extra-axial collection or mass lesion. Mild for age T2/FLAIR hyperintensities within the white matter, likely related to chronic microvascular ischemic disease. Mild diffuse cerebral atrophy. Vascular: Major arterial flow voids are maintained at the skull base. Skull and upper cervical spine: Normal marrow signal. Sinuses/Orbits: Scattered paranasal sinus mucosal thickening with air-fluid level in the left maxillary sinus. Other: Motion limited study. IMPRESSION: 1. No evidence of acute intracranial abnormality on this motion limited exam. No acute infarct. 2. Nonspecific paranasal sinus disease with air-fluid level in the left maxillary sinus. Correlate with signs/symptoms of sinusitis. Electronically Signed   By: Feliberto HartsFrederick S Jones MD   On: 08/13/2020 17:32   MR CERVICAL SPINE WO CONTRAST  Result Date: 08/16/2020 CLINICAL DATA:  Low back pain.  Progressive neurological deficit. EXAM: MRI CERVICAL, THORACIC AND LUMBAR SPINE WITHOUT CONTRAST TECHNIQUE: Multiplanar and multiecho pulse sequences of the cervical spine, to include the craniocervical junction and cervicothoracic junction, and thoracic and lumbar spine, were obtained without intravenous contrast. COMPARISON:  None. FINDINGS: Patient motion degrades image quality limiting evaluation. MRI CERVICAL SPINE FINDINGS Alignment: Physiologic. Vertebrae: No fracture, evidence of discitis, or bone lesion. Cord: Normal signal and morphology. Posterior Fossa, vertebral arteries, paraspinal tissues: Posterior fossa demonstrates no focal abnormality. Vertebral artery flow voids are maintained. Paraspinal soft tissues are unremarkable. Disc levels: Discs: Degenerative disease with disc height loss C3-4, C4-5 and C5-6. C2-3: No significant disc bulge. No neural foraminal stenosis. No central canal stenosis. C3-4: No significant disc bulge. Severe right  foraminal stenosis. Mild left foraminal stenosis. No central canal stenosis. C4-5: No  significant disc bulge. Bilateral uncovertebral degenerative changes. Severe bilateral foraminal stenosis. No central canal stenosis. C5-6: Broad-based disc bulge. Mild right and severe left foraminal stenosis. No central canal stenosis. C6-7: No significant disc bulge. Mild left foraminal stenosis. No right foraminal stenosis. No central canal stenosis. C7-T1: No significant disc bulge. No neural foraminal stenosis. No central canal stenosis. MRI THORACIC SPINE FINDINGS Alignment:  Physiologic. Vertebrae: No acute fracture, discitis osteomyelitis, or aggressive osseous lesion. T6 vertebral body hemangioma. L1 vertebral body hemangioma. Cord:  Normal signal and morphology. Paraspinal and other soft tissues: No acute paraspinal abnormality. Disc levels: Disc spaces:  Degenerative disease with disc height loss at T6-7. T1-T2: No disc protrusion, foraminal stenosis or central canal stenosis. T2-T3: No disc protrusion, foraminal stenosis or central canal stenosis. T3-T4: No disc protrusion, foraminal stenosis or central canal stenosis. T4-T5: No disc protrusion, foraminal stenosis or central canal stenosis. T5-T6: No disc protrusion, foraminal stenosis or central canal stenosis. T6-T7: Tiny right paracentral disc protrusion. No foraminal or central canal stenosis. T7-T8: No disc protrusion, foraminal stenosis or central canal stenosis. T8-T9: No disc protrusion, foraminal stenosis or central canal stenosis. T9-T10: No disc protrusion, foraminal stenosis or central canal stenosis. T10-T11: No disc protrusion, foraminal stenosis or central canal stenosis. T11-T12: Mild broad-based disc bulge. No foraminal or central canal stenosis. MRI LUMBAR SPINE FINDINGS Segmentation: Transitional anatomy of the lumbar spine with sacralization of the L5 vertebral body. Alignment: Minimal grade 1 anterolisthesis of L4 on L5 secondary to facet disease. Vertebrae:  No fracture, evidence of discitis, or bone lesion. Conus medullaris and  cauda equina: Conus extends to the T12-L1 level. Conus and cauda equina appear normal. Paraspinal and other soft tissues: No acute paraspinal abnormality. Disc levels: Disc spaces: Disc desiccation of the lumbar spine. T12-L1: Mild broad-based disc bulge with a tiny central disc protrusion. No evidence of neural foraminal stenosis. No central canal stenosis. L1-L2: Mild broad-based disc bulge. Mild bilateral facet arthropathy. No evidence of neural foraminal stenosis. No central canal stenosis. L2-L3: No significant disc bulge. No evidence of neural foraminal stenosis. No central canal stenosis. L3-L4: Minimal broad-based disc bulge. No evidence of neural foraminal stenosis. No central canal stenosis. L4-L5: Broad-based disc bulge. Severe bilateral facet arthropathy with bilateral facet effusions. Bilateral subarticular recess narrowing. No evidence of neural foraminal stenosis. No central canal stenosis. L5-S1: No significant disc bulge. No evidence of neural foraminal stenosis. No central canal stenosis. IMPRESSION: 1. No acute injury of the cervical, thoracic, and lumbar spine. 2. Mild spondylosis of the cervical, thoracic and lumbar spine. Electronically Signed   By: Elige Ko   On: 08/16/2020 13:59   MR THORACIC SPINE WO CONTRAST  Result Date: 08/16/2020 CLINICAL DATA:  Low back pain.  Progressive neurological deficit. EXAM: MRI CERVICAL, THORACIC AND LUMBAR SPINE WITHOUT CONTRAST TECHNIQUE: Multiplanar and multiecho pulse sequences of the cervical spine, to include the craniocervical junction and cervicothoracic junction, and thoracic and lumbar spine, were obtained without intravenous contrast. COMPARISON:  None. FINDINGS: Patient motion degrades image quality limiting evaluation. MRI CERVICAL SPINE FINDINGS Alignment: Physiologic. Vertebrae: No fracture, evidence of discitis, or bone lesion. Cord: Normal signal and morphology. Posterior Fossa, vertebral arteries, paraspinal tissues: Posterior fossa  demonstrates no focal abnormality. Vertebral artery flow voids are maintained. Paraspinal soft tissues are unremarkable. Disc levels: Discs: Degenerative disease with disc height loss C3-4, C4-5 and C5-6. C2-3: No significant disc bulge. No neural foraminal stenosis. No central  canal stenosis. C3-4: No significant disc bulge. Severe right foraminal stenosis. Mild left foraminal stenosis. No central canal stenosis. C4-5: No significant disc bulge. Bilateral uncovertebral degenerative changes. Severe bilateral foraminal stenosis. No central canal stenosis. C5-6: Broad-based disc bulge. Mild right and severe left foraminal stenosis. No central canal stenosis. C6-7: No significant disc bulge. Mild left foraminal stenosis. No right foraminal stenosis. No central canal stenosis. C7-T1: No significant disc bulge. No neural foraminal stenosis. No central canal stenosis. MRI THORACIC SPINE FINDINGS Alignment:  Physiologic. Vertebrae: No acute fracture, discitis osteomyelitis, or aggressive osseous lesion. T6 vertebral body hemangioma. L1 vertebral body hemangioma. Cord:  Normal signal and morphology. Paraspinal and other soft tissues: No acute paraspinal abnormality. Disc levels: Disc spaces:  Degenerative disease with disc height loss at T6-7. T1-T2: No disc protrusion, foraminal stenosis or central canal stenosis. T2-T3: No disc protrusion, foraminal stenosis or central canal stenosis. T3-T4: No disc protrusion, foraminal stenosis or central canal stenosis. T4-T5: No disc protrusion, foraminal stenosis or central canal stenosis. T5-T6: No disc protrusion, foraminal stenosis or central canal stenosis. T6-T7: Tiny right paracentral disc protrusion. No foraminal or central canal stenosis. T7-T8: No disc protrusion, foraminal stenosis or central canal stenosis. T8-T9: No disc protrusion, foraminal stenosis or central canal stenosis. T9-T10: No disc protrusion, foraminal stenosis or central canal stenosis. T10-T11: No disc  protrusion, foraminal stenosis or central canal stenosis. T11-T12: Mild broad-based disc bulge. No foraminal or central canal stenosis. MRI LUMBAR SPINE FINDINGS Segmentation: Transitional anatomy of the lumbar spine with sacralization of the L5 vertebral body. Alignment: Minimal grade 1 anterolisthesis of L4 on L5 secondary to facet disease. Vertebrae:  No fracture, evidence of discitis, or bone lesion. Conus medullaris and cauda equina: Conus extends to the T12-L1 level. Conus and cauda equina appear normal. Paraspinal and other soft tissues: No acute paraspinal abnormality. Disc levels: Disc spaces: Disc desiccation of the lumbar spine. T12-L1: Mild broad-based disc bulge with a tiny central disc protrusion. No evidence of neural foraminal stenosis. No central canal stenosis. L1-L2: Mild broad-based disc bulge. Mild bilateral facet arthropathy. No evidence of neural foraminal stenosis. No central canal stenosis. L2-L3: No significant disc bulge. No evidence of neural foraminal stenosis. No central canal stenosis. L3-L4: Minimal broad-based disc bulge. No evidence of neural foraminal stenosis. No central canal stenosis. L4-L5: Broad-based disc bulge. Severe bilateral facet arthropathy with bilateral facet effusions. Bilateral subarticular recess narrowing. No evidence of neural foraminal stenosis. No central canal stenosis. L5-S1: No significant disc bulge. No evidence of neural foraminal stenosis. No central canal stenosis. IMPRESSION: 1. No acute injury of the cervical, thoracic, and lumbar spine. 2. Mild spondylosis of the cervical, thoracic and lumbar spine. Electronically Signed   By: Elige Ko   On: 08/16/2020 13:59   MR LUMBAR SPINE WO CONTRAST  Result Date: 08/16/2020 CLINICAL DATA:  Low back pain.  Progressive neurological deficit. EXAM: MRI CERVICAL, THORACIC AND LUMBAR SPINE WITHOUT CONTRAST TECHNIQUE: Multiplanar and multiecho pulse sequences of the cervical spine, to include the craniocervical  junction and cervicothoracic junction, and thoracic and lumbar spine, were obtained without intravenous contrast. COMPARISON:  None. FINDINGS: Patient motion degrades image quality limiting evaluation. MRI CERVICAL SPINE FINDINGS Alignment: Physiologic. Vertebrae: No fracture, evidence of discitis, or bone lesion. Cord: Normal signal and morphology. Posterior Fossa, vertebral arteries, paraspinal tissues: Posterior fossa demonstrates no focal abnormality. Vertebral artery flow voids are maintained. Paraspinal soft tissues are unremarkable. Disc levels: Discs: Degenerative disease with disc height loss C3-4, C4-5 and C5-6. C2-3: No significant  disc bulge. No neural foraminal stenosis. No central canal stenosis. C3-4: No significant disc bulge. Severe right foraminal stenosis. Mild left foraminal stenosis. No central canal stenosis. C4-5: No significant disc bulge. Bilateral uncovertebral degenerative changes. Severe bilateral foraminal stenosis. No central canal stenosis. C5-6: Broad-based disc bulge. Mild right and severe left foraminal stenosis. No central canal stenosis. C6-7: No significant disc bulge. Mild left foraminal stenosis. No right foraminal stenosis. No central canal stenosis. C7-T1: No significant disc bulge. No neural foraminal stenosis. No central canal stenosis. MRI THORACIC SPINE FINDINGS Alignment:  Physiologic. Vertebrae: No acute fracture, discitis osteomyelitis, or aggressive osseous lesion. T6 vertebral body hemangioma. L1 vertebral body hemangioma. Cord:  Normal signal and morphology. Paraspinal and other soft tissues: No acute paraspinal abnormality. Disc levels: Disc spaces:  Degenerative disease with disc height loss at T6-7. T1-T2: No disc protrusion, foraminal stenosis or central canal stenosis. T2-T3: No disc protrusion, foraminal stenosis or central canal stenosis. T3-T4: No disc protrusion, foraminal stenosis or central canal stenosis. T4-T5: No disc protrusion, foraminal stenosis or  central canal stenosis. T5-T6: No disc protrusion, foraminal stenosis or central canal stenosis. T6-T7: Tiny right paracentral disc protrusion. No foraminal or central canal stenosis. T7-T8: No disc protrusion, foraminal stenosis or central canal stenosis. T8-T9: No disc protrusion, foraminal stenosis or central canal stenosis. T9-T10: No disc protrusion, foraminal stenosis or central canal stenosis. T10-T11: No disc protrusion, foraminal stenosis or central canal stenosis. T11-T12: Mild broad-based disc bulge. No foraminal or central canal stenosis. MRI LUMBAR SPINE FINDINGS Segmentation: Transitional anatomy of the lumbar spine with sacralization of the L5 vertebral body. Alignment: Minimal grade 1 anterolisthesis of L4 on L5 secondary to facet disease. Vertebrae:  No fracture, evidence of discitis, or bone lesion. Conus medullaris and cauda equina: Conus extends to the T12-L1 level. Conus and cauda equina appear normal. Paraspinal and other soft tissues: No acute paraspinal abnormality. Disc levels: Disc spaces: Disc desiccation of the lumbar spine. T12-L1: Mild broad-based disc bulge with a tiny central disc protrusion. No evidence of neural foraminal stenosis. No central canal stenosis. L1-L2: Mild broad-based disc bulge. Mild bilateral facet arthropathy. No evidence of neural foraminal stenosis. No central canal stenosis. L2-L3: No significant disc bulge. No evidence of neural foraminal stenosis. No central canal stenosis. L3-L4: Minimal broad-based disc bulge. No evidence of neural foraminal stenosis. No central canal stenosis. L4-L5: Broad-based disc bulge. Severe bilateral facet arthropathy with bilateral facet effusions. Bilateral subarticular recess narrowing. No evidence of neural foraminal stenosis. No central canal stenosis. L5-S1: No significant disc bulge. No evidence of neural foraminal stenosis. No central canal stenosis. IMPRESSION: 1. No acute injury of the cervical, thoracic, and lumbar spine.  2. Mild spondylosis of the cervical, thoracic and lumbar spine. Electronically Signed   By: Elige Ko   On: 08/16/2020 13:59   IR Fluoro Guide CV Line Right  Result Date: 08/19/2020 INDICATION: Karlene Lineman syndrome. Central venous access required for plasma exchange. EXAM: Non-tunneled dialysis catheter placement MEDICATIONS: None ANESTHESIA/SEDATION: None FLUOROSCOPY TIME:  Fluoroscopy Time: 24 seconds (2 mGy). COMPLICATIONS: None immediate. PROCEDURE: Informed written consent was obtained from the patient after a thorough discussion of the procedural risks, benefits and alternatives. All questions were addressed. Maximal Sterile Barrier Technique was utilized including caps, mask, sterile gowns, sterile gloves, sterile drape, hand hygiene and skin antiseptic. A timeout was performed prior to the initiation of the procedure. Patient positioned supine on the angiography table. Right neck prepped and draped in the usual sterile fashion. All elements of maximal  sterile barrier were utilized including, cap, mask, sterile gown, sterile gloves, large sterile drape, hand scrubbing and 2% Chlorhexidine for skin cleaning. The right internal jugular vein was evaluated with ultrasound and shown to be patent. A permanent ultrasound image was obtained and placed in the patient's medical record. Using sterile gel and a sterile probe cover, the right internal jugular vein was entered with a 21 ga needle during real time ultrasound guidance. 0.018 inch guidewire placed and 21 ga needle exchanged for transitional dilator set. Utilizing fluoroscopy, 0.035 inch guidewire advanced through the needle without difficulty. Serial dilation performed, and catheter inserted over the guidewire. The tip was positioned in the right atrium. All lumens of the catheter aspirated and flushed well. The dialysis lumens were locked with Heparin. The catheter was secured to the skin with suture. The insertion site was covered with a Biopatch  and sterile dressing. IMPRESSION: Right IJ non tunneled hemodialysis catheter (16 cm) as above. Electronically Signed   By: Acquanetta Belling M.D.   On: 08/19/2020 16:34   IR US Guide Vasc Access Right  Result Date: 08/19/2020 INDICATION: Guillian Barre syndrome. Central venous access required for plasma exchange. EXAM: Non-tunneled dialysis catheter placement MEDICATIONS: None ANESTHESIA/SEDATION: None FLUOROSCOPY TIME:  Fluoroscopy Time: 24 seconds (2 mGy). COMPLICATIONS: None immediate. PROCEDURE: Informed written consent was obtained from the patient after a thorough discussion of the procedural risks, benefits and alternatives. All questions were addressed. Maximal Sterile Barrier Technique was utilized including caps, mask, sterile gowns, sterile gloves, sterile drape, hand hygiene and skin antiseptic. A timeout was performed prior to the initiation of the procedure. Patient positioned supine on the angiography table. Right neck prepped and draped in the usual sterile fashion. All elements of maximal sterile barrier were utilized including, cap, mask, sterile gown, sterile gloves, large sterile drape, hand scrubbing and 2% Chlorhexidine for skin cleaning. The right internal jugular vein was evaluated with ultrasound and shown to be patent. A permanent ultrasound image was obtained and placed in the patient's medical record. Using sterile gel and a sterile probe cover, the right internal jugular vein was entered with a 21 ga needle during real time ultrasound guidance. 0.018 inch guidewire placed and 21 ga needle exchanged for transitional dilator set. Utilizing fluoroscopy, 0.035 inch guidewire advanced through the needle without difficulty. Serial dilation performed, and catheter inserted over the guidewire. The tip was positioned in the right atrium. All lumens of the catheter aspirated and flushed well. The dialysis lumens were locked with Heparin. The catheter was secured to the skin with suture. The  insertion site was covered with a Biopatch and sterile dressing. IMPRESSION: Right IJ non tunneled hemodialysis catheter (16 cm) as above. Electronically Signed   By: Acquanetta Belling M.D.   On: 08/19/2020 16:34      Subjective: Patient seen and examined.  Reports left upper toothache with no improvement with as needed pain medication.  No fever or chills.  Reports improvement in pain and paresthesia in bilateral lower extremities.  No acute events overnight.  Discharge Exam: Vitals:   09/01/20 0923 09/01/20 1127  BP: 97/64 92/61  Pulse: 81 79  Resp: 17 18  Temp: 98.2 F (36.8 C) 97.7 F (36.5 C)  SpO2: 95% 95%   Vitals:   09/01/20 0500 09/01/20 0729 09/01/20 0923 09/01/20 1127  BP:  113/72 97/64 92/61   Pulse:  83 81 79  Resp:  18 17 18   Temp:  97.8 F (36.6 C) 98.2 F (36.8 C) 97.7 F (36.5 C)  TempSrc:  Oral Oral Oral  SpO2:  94% 95% 95%  Weight: 74 kg     Height:        General: Pt is alert, awake, not in acute distress, on room air, communicating well Cardiovascular: RRR, S1/S2 +, no rubs, no gallops Respiratory: CTA bilaterally, no wheezing, no rhonchi Abdominal: Soft, NT, ND, bowel sounds + Extremities: no edema, no cyanosis    The results of significant diagnostics from this hospitalization (including imaging, microbiology, ancillary and laboratory) are listed below for reference.     Microbiology: No results found for this or any previous visit (from the past 240 hour(s)).   Labs: BNP (last 3 results) No results for input(s): BNP in the last 8760 hours. Basic Metabolic Panel: Recent Labs  Lab 08/26/20 0339 08/27/20 0338 08/28/20 0624 08/30/20 0500 09/01/20 0407  NA 133* 131* 129* 134* 134*  K 4.2 4.6 4.1 3.9 4.3  CL 100 102 100 103 100  CO2 24 20* 21* 23 24  GLUCOSE 127* 126* 205* 186* 189*  BUN 11 10 19  31* 29*  CREATININE 0.98 1.02 1.13 1.02 0.96  CALCIUM 9.1 9.3 9.2 8.2* 8.5*   Liver Function Tests: Recent Labs  Lab 08/26/20 0339  AST 32   ALT 39  ALKPHOS 34*  BILITOT 0.5  PROT 5.8*  ALBUMIN 3.7   No results for input(s): LIPASE, AMYLASE in the last 168 hours. No results for input(s): AMMONIA in the last 168 hours. CBC: Recent Labs  Lab 08/26/20 0339 08/27/20 0338 08/28/20 0624 08/30/20 0500 09/01/20 0407  WBC 15.0* 18.5* 20.9* 19.2* 18.4*  HGB 13.0 13.7 12.3* 11.5* 10.7*  HCT 37.8* 41.5 37.5* 33.8* 31.3*  MCV 83.4 84.7 83.5 83.5 82.8  PLT 350 352 343 415* 373   Cardiac Enzymes: No results for input(s): CKTOTAL, CKMB, CKMBINDEX, TROPONINI in the last 168 hours. BNP: Invalid input(s): POCBNP CBG: Recent Labs  Lab 08/31/20 1204 08/31/20 1806 08/31/20 2116 09/01/20 0611 09/01/20 1205  GLUCAP 235* 163* 177* 186* 180*   D-Dimer No results for input(s): DDIMER in the last 72 hours. Hgb A1c No results for input(s): HGBA1C in the last 72 hours. Lipid Profile No results for input(s): CHOL, HDL, LDLCALC, TRIG, CHOLHDL, LDLDIRECT in the last 72 hours. Thyroid function studies No results for input(s): TSH, T4TOTAL, T3FREE, THYROIDAB in the last 72 hours.  Invalid input(s): FREET3 Anemia work up No results for input(s): VITAMINB12, FOLATE, FERRITIN, TIBC, IRON, RETICCTPCT in the last 72 hours. Urinalysis    Component Value Date/Time   COLORURINE YELLOW 08/11/2020 0050   APPEARANCEUR CLEAR 08/11/2020 0050   LABSPEC 1.020 08/11/2020 0050   PHURINE 5.0 08/11/2020 0050   GLUCOSEU NEGATIVE 08/11/2020 0050   HGBUR LARGE (A) 08/11/2020 0050   BILIRUBINUR NEGATIVE 08/11/2020 0050   KETONESUR NEGATIVE 08/11/2020 0050   PROTEINUR 30 (A) 08/11/2020 0050   NITRITE NEGATIVE 08/11/2020 0050   LEUKOCYTESUR NEGATIVE 08/11/2020 0050   Sepsis Labs Invalid input(s): PROCALCITONIN,  WBC,  LACTICIDVEN Microbiology No results found for this or any previous visit (from the past 240 hour(s)).   Time coordinating discharge: Over 30 minutes  SIGNED:   10/09/2020, MD  [facility] 09/01/2020, 2:50  PM Pager   If 7PM-7AM, please contact night-coverage [email or web address]

## 2020-09-01 NOTE — TOC Transition Note (Signed)
Transition of Care [facility]) - CM/SW Discharge Note   Patient Details  Name: Jacob Cameron MRN: [id number] Date of Birth: 10/19/1955  Transition of Care [facility]) CM/SW Contact:  Kermit Balo, RN Phone Number: 09/01/2020, 11:01 AM   Clinical Narrative:    Patient is discharging to CIR after his last plasmapheresis today. No further needs per TOC.   Final next level of care: IP Rehab Facility Barriers to Discharge: No Barriers Identified   Patient Goals and CMS Choice     Choice offered to / list presented to : Patient  Discharge Placement                       Discharge Plan and Services                                     Social Determinants of Health (SDOH) Interventions     Readmission Risk Interventions No flowsheet data found.

## 2020-09-01 NOTE — Progress Notes (Signed)
Patient performed NIF with good patient effort. Patient done greater than -40.

## 2020-09-01 NOTE — Progress Notes (Signed)
Inpatient Rehabilitation Medication Review by a Pharmacist  A complete drug regimen review was completed for this patient to identify any potential clinically significant medication issues.  Clinically significant medication issues were identified:  yes   Type of Medication Issue Identified Description of Issue Urgent (address now) Non-Urgent (address on AM team rounds) Plan Plan Accepted by Provider? (Yes / No / Pending AM Rounds)  Drug Interaction(s) (clinically significant)       Duplicate Therapy  Tylenol/ibuprofen for mild pain, robitussin/tessalon for cough, senna/doc and miralax for mild constipation Non-urgent Sent chat to admitting PA to clarify duplications Pending  Allergy       No Medication Administration End Date       Incorrect Dose       Additional Drug Therapy Needed  Patient discharged with RX for augmentin for possible dental infection Non-urgent Will message PA to investigate need for antibiotics in this patient.  Pending  Other         Name of provider notified for urgent issues identified:   Provider Method of Notification:    For non-urgent medication issues to be resolved on team rounds tomorrow morning a [facility] Secure Chat Handoff was sent to: Delle Reining, PA   Pharmacist comments:   Time spent performing this drug regimen review (minutes):  10   Mak Bonny A. Jeanella Craze, PharmD, BCPS, FNKF Clinical Pharmacist [place] Please utilize Amion for appropriate phone number to reach the unit pharmacist [facility])  09/01/2020 5:12 PM

## 2020-09-01 NOTE — Progress Notes (Signed)
Inpatient Rehabilitation  Patient information reviewed and entered into eRehab system by Melissa M. Bowie, M.A., CCC/SLP, PPS Coordinator.  Information including medical coding, functional ability and quality indicators will be reviewed and updated through discharge.    

## 2020-09-01 NOTE — PMR Pre-admission (Signed)
PMR Admission Coordinator Pre-Admission Assessment  Patient: Jacob Cameron is an 65 y.o., male MRN: [id number] DOB: Nov 25, 1955 Height: $RemoveBefo'5\' 7"'[id number]$  (170.2 cm) Weight: 74 kg  Insurance Information HMO:     PPO:      PCP:      IPA:      80/20:      OTHER:  PRIMARY: Medicare A and B      Policy#: [id number]      Subscriber: pt CM Name:       Phone#:      Fax#:  Pre-Cert#: verified Civil engineer, contracting:  Benefits:  Phone #:      Name:  Eff. Date: 02/09/19 A and B     Deduct: $1556      Out of Pocket Max: n/a      Life Max: n/a CIR: 100%      SNF: 20 full days Outpatient: 80%     Co-Pay: 20% Home Health: 100%      Co-Pay:  DME: 80%     Co-Pay: 20% Providers:  SECONDARY:       Policy#:      Phone#:   Development worker, community:       Phone#:   The Therapist, art Information Summary" for patients in Inpatient Rehabilitation Facilities with attached "Privacy Act [place] Records" was provided and verbally reviewed with: Patient and Family  Emergency Contact Information Contact Information    Name Relation Home Work [place], [place] Son [phone]  [phone]   [place], [place] [phone]  [phone]      Current Medical History  Patient Admitting Diagnosis: Guillion Barre Syndrome  History of Present Illness: Jacob Cameron is a 65 year old male in relatively good health who was admitted on 08/12/2020 with bilateral lower extremity and right upper extremity paresthesias with shooting pains with BLE weakness. Patient initially refused MRI but was agreeable to have it with sedation. MRI brain negative. MRI spine was negative for injury and showed mild spondylosis of cervical, thoracic and lumbar spine. LP recommended for work-up and patient refused this.  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 Low vitamin B 12  levels noted and was started on supplement--1000 mg qd X 7 followed by weekly X 4 and then monthly.   He continued to have issues with pain, nausea and progressive symptoms therefore plasmapheresis recommended and patient/family agreeable, therefore dialysis catheter placed on 02/09 by Dr. 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  Additional work up --Sjogren's antibody panel, urine for heavy metals, RF, ANA urine electrophoresis-->all negative. SIADH treated with fluid restriction. Noted to have new onset diabetes with Hgb A1c-7 --doubt cause of neuropathy. He has had decrease in symptoms with improvement in strength but continues to be limtied by weakness with balance deficits. CIR recommended due to functional decline.   Complete NIHSS TOTAL: 5  Patient's medical record from [facility] has been reviewed by the rehabilitation admission coordinator and physician.  Past Medical History  History reviewed. No pertinent past medical history.  Family History   family history is not on file.  Prior Rehab/Hospitalizations Has the patient had prior rehab or hospitalizations prior to admission? No  Has the patient had  major surgery during 100 days prior to admission? No   Current Medications  Current Facility-Administered Medications:  .  acetaminophen (TYLENOL) tablet 650 mg, 650 mg, Oral, Q4H PRN, 650 mg at 08/29/20 2233 **OR** acetaminophen (TYLENOL) 160 MG/5ML solution 650 mg, 650 mg, Per Tube, Q4H PRN **OR** acetaminophen (TYLENOL) suppository 650 mg, 650 mg, Rectal, Q4H PRN, Karmen Bongo, MD .  acetaminophen (TYLENOL) tablet 650 mg, 650 mg, Oral, Q4H PRN, Kerney Elbe, MD .  benzonatate (TESSALON) capsule 100 mg, 100 mg, Oral, TID PRN, Donne Hazel, MD, 100 mg at 08/22/20 1741 .  Chlorhexidine Gluconate Cloth 2 % PADS 6  each, 6 each, Topical, Daily, Donne Hazel, MD, 6 each at 09/01/20 1426 .  cyanocobalamin ((VITAMIN B-12)) injection 1,000 mcg, 1,000 mcg, Intramuscular, Q7 days, Kerney Elbe, MD, 1,000 mcg at 08/31/20 0809 .  diphenhydrAMINE (BENADRYL) capsule 25 mg, 25 mg, Oral, Q6H PRN, Kerney Elbe, MD .  enoxaparin (LOVENOX) injection 40 mg, 40 mg, Subcutaneous, Q24H, Karmen Bongo, MD, 40 mg at 08/31/20 1738 .  gabapentin (NEURONTIN) capsule 200 mg, 200 mg, Oral, TID, Donne Hazel, MD, 200 mg at 09/01/20 0841 .  hydrALAZINE (APRESOLINE) injection 5 mg, 5 mg, Intravenous, Q4H PRN, Shela Leff, MD .  HYDROmorphone (DILAUDID) injection 1 mg, 1 mg, Intravenous, Q3H PRN, Manuella Ghazi, Pratik D, DO, 1 mg at 09/01/20 0636 .  ibuprofen (ADVIL) tablet 600 mg, 600 mg, Oral, Q4H PRN, Pahwani, Rinka R, MD, 600 mg at 09/01/20 1053 .  insulin aspart (novoLOG) injection 0-6 Units, 0-6 Units, Subcutaneous, TID WC, Patrecia Pour, MD, 1 Units at 09/01/20 1225 .  lidocaine (PF) (XYLOCAINE) 1 % injection, , , PRN, Mir, Paula Libra, MD, 5 mL at 08/19/20 1549 .  ondansetron (ZOFRAN) injection 4 mg, 4 mg, Intravenous, Q6H PRN, Karmen Bongo, MD, 4 mg at 08/30/20 1230 .  oxyCODONE (Oxy IR/ROXICODONE) immediate release tablet 5 mg, 5 mg, Oral, Q4H PRN, Patrecia Pour, MD, 5 mg at 08/31/20 2149 .  pantoprazole (PROTONIX) EC tablet 40 mg, 40 mg, Oral, Daily, Pahwani, Rinka R, MD, 40 mg at 09/01/20 0840 .  polyethylene glycol (MIRALAX / GLYCOLAX) packet 17 g, 17 g, Oral, Daily, Pahwani, Rinka R, MD, 17 g at 09/01/20 0840 .  rosuvastatin (CRESTOR) tablet 5 mg, 5 mg, Oral, Daily, Patrecia Pour, MD, 5 mg at 09/01/20 0841 .  senna-docusate (Senokot-S) tablet 1 tablet, 1 tablet, Oral, QHS PRN, Karmen Bongo, MD, 1 tablet at 08/17/20 0529 .  zolpidem (AMBIEN) tablet 5 mg, 5 mg, Oral, QHS PRN, Donne Hazel, MD  Patients Current Diet:  Diet Order            Diet heart healthy/carb modified Room service appropriate? Yes; Fluid  consistency: Thin; Fluid restriction: 1500 mL Fluid  Diet effective now                 Precautions / Restrictions Precautions Precautions: Fall Precaution Comments: Translator helpful but pt speaks pretty good English Restrictions Weight Bearing Restrictions: No   Has the patient had 2 or more falls or a fall with injury in the past year? Yes  Prior Activity Level Limited Community (1-2x/wk): did not drive prior to admit, no DME used, independent with mobility  Prior Functional Level Self Care: Did the patient need help bathing, dressing, using the toilet or eating? Independent  Indoor Mobility: Did the patient need assistance with walking from room to room (with or without device)? Independent  Stairs: Did the patient need  assistance with internal or external stairs (with or without device)? Independent  Functional Cognition: Did the patient need help planning regular tasks such as shopping or remembering to take medications? Independent  Home Assistive Devices / Equipment Home Assistive Devices/Equipment: None Home Equipment: None  Prior Device Use: Indicate devices/aids used by the patient prior to current illness, exacerbation or injury? None of the above  Current Functional Level Cognition  Overall Cognitive Status: Within Functional Limits for tasks assessed Orientation Level: Oriented X4 General Comments: Very motivated despite pain and fatigue    Extremity Assessment (includes Sensation/Coordination)  Upper Extremity Assessment: RUE deficits/detail,LUE deficits/detail RUE Deficits / Details: Poor strength and coorindation. R weaker than L. Pt with difficulty opening containers. Poor grasp and pinch. Reporting "needle" feeling RUE Sensation:  ("needles") RUE Coordination: decreased fine motor,decreased gross motor LUE Deficits / Details: Weakness and poor coorindation. Decreased grasp and pinch strength. LUE Coordination: decreased fine motor,decreased gross  motor  Lower Extremity Assessment: Defer to PT evaluation RLE Deficits / Details: MMT scores of the following, good strength but poor ability maintain contraction as muscle would contract release and repeat: hip flexion 4-, knee extension 5, knee flexion 4+, ankle dorsiflexion 4 RLE Sensation: decreased light touch (pins/needles throughout, worse from knees and below) RLE Coordination: decreased fine motor,decreased gross motor LLE Deficits / Details: MMT scores of the following, good strength but poor ability maintain contraction as muscle would contract release and repeat: hip flexion 4-, knee extension 5, knee flexion 4+, ankle dorsiflexion 4 LLE Sensation: decreased light touch (pins/needles throughout, worse from knees and below) LLE Coordination: decreased fine motor,decreased gross motor    ADLs  Overall ADL's : Needs assistance/impaired Eating/Feeding: Set up,Sitting Eating/Feeding Details (indicate cue type and reason): provided pt with built up handles Grooming: Oral care,Sitting,Minimal assistance Grooming Details (indicate cue type and reason): built up handle utilized. Using BUE to switch position of toothbrush due to poor in hand manipulation. Requiring Min A for initating opening tooth paste due to pinch weakness. Upper Body Bathing: Minimal assistance,Sitting Lower Body Bathing: Maximal assistance,Sit to/from stand Upper Body Dressing : Minimal assistance,Sitting Lower Body Dressing: Sit to/from stand,Moderate assistance Lower Body Dressing Details (indicate cue type and reason): Mod A for managing socks and position hands. Poor grasp and frequently slipping away from sock Toilet Transfer: Minimal assistance,Stand-pivot,RW (simulated to recliner) Toilet Transfer Details (indicate cue type and reason): Min A for power up and maintaining balance in standing Toileting- Clothing Manipulation and Hygiene: Moderate assistance,+2 for physical assistance,+2 for  safety/equipment Toileting - Clothing Manipulation Details (indicate cue type and reason): modA+2 for stability while standing as pt completed pericare Functional mobility during ADLs: Minimal assistance,Rolling walker General ADL Comments: Pt continues to present with decreased grasp strength, coorindation, balance, and acitviyt tolerance. Despite fatigue, pt very motivated to partipate in therapy    Mobility  Overal bed mobility: Needs Assistance Bed Mobility: Supine to Sit Supine to sit: Supervision,HOB elevated Sit to supine: Supervision General bed mobility comments: pt utilizes rails to get to edge of bed    Transfers  Overall transfer level: Needs assistance Equipment used: Rolling walker (2 wheeled) Transfers: Sit to/from Stand Sit to Stand: Min assist,Mod assist Stand pivot transfers: Min assist General transfer comment: pt requires assistance to power up into standing from EOB x2, from chair x2. Cues for hand placement and technique.    Ambulation / Gait / Stairs / Wheelchair Mobility  Ambulation/Gait Ambulation/Gait assistance: Herbalist (Feet): 40 Feet (+ 60') Assistive device:  Rolling walker (2 wheeled) Gait Pattern/deviations: Step-through pattern,Decreased step length - left,Narrow base of support,Trunk flexed General Gait Details: pt with slowed step-through gait with uncontrolled inital contact bilaterally with increased step length RLE; less knee hyperextension noted today, impaired medial-lateral pelvic control Gait velocity: reduced Gait velocity interpretation: <1.31 ft/sec, indicative of household ambulator    Posture / Balance Dynamic Sitting Balance Sitting balance - Comments: Supervision for safety Balance Overall balance assessment: Needs assistance Sitting-balance support: Feet supported,No upper extremity supported Sitting balance-Leahy Scale: Good Sitting balance - Comments: Supervision for safety Postural control: Other (comment)  (anterior lean in standing) Standing balance support: During functional activity Standing balance-Leahy Scale: Poor Standing balance comment: reliant on BUE support of RW; stood for ~3 minutes to adjust pants.    Special needs/care consideration Diabetic management yes (new diabetic) and Special service needs possibility of 1 remaining PLEX treatment on 2/23 if he does not complete final treatment on 2/22.  Understands English fairly well but would do better with interpreter   Previous Home Environment (from acute therapy documentation) Living Arrangements: Spouse/significant other,Children (13 y.o. or older sons) Available Help at Discharge: Family,Available 24 hours/day Type of Home: [place]: One level Home Access: Level entry Bathroom Shower/Tub: Chiropodist: Standard  Discharge Living Setting Plans for Discharge Living Setting: Patient's home Type of Home at Discharge: House Discharge Home Layout: One level Discharge Home Access: Level entry Discharge Bathroom Shower/Tub: [place]gkins unit Discharge Bathroom Toilet: Standard Discharge [place] Accessibility: Yes How Accessible: Accessible via walker Does the patient have any problems obtaining your medications?: No  Social/Family/Support Systems Patient Roles: Spouse Anticipated Caregiver: 2 adult sons Leigh [place] and [place]) and his wife Anticipated Caregiver's Contact Information: Leigh Aurora [phone] Ability/Limitations of Caregiver: n/a Caregiver Availability: 24/7 Discharge Plan Discussed with Primary Caregiver: Yes Is Caregiver In Agreement with Plan?: Yes Does Caregiver/Family have Issues with Lodging/Transportation while Pt is in Rehab?: No  Goals Patient/Family Goal for Rehab: PT/OT mod I, SLP n/a Expected length of stay: 7-10 days Cultural Considerations: [place], Urdu is first language, ESL Additional Information: interpreter requested for initial therapy evals,  CSW copied for ongoing interpreting needs Pt/Family Agrees to Admission and willing to participate: Yes Program Orientation Provided & Reviewed with Pt/Caregiver Including Roles  & Responsibilities: Yes  Decrease burden of Care through IP rehab admission: n/a Possible need for SNF placement upon discharge: not anticipated.  Pt with good family support and mod I goals.   Patient Condition: I have reviewed medical records from [facility], spoken with CM, and patient and son. I met with patient at the bedside for inpatient rehabilitation assessment.  Patient will benefit from ongoing PT and OT, can actively participate in 3 hours of therapy a day 5 days of the week, and can make measurable gains during the admission.  Patient will also benefit from the coordinated team approach during an Inpatient Acute Rehabilitation admission.  The patient will receive intensive therapy as well as Rehabilitation physician, nursing, social worker, and care management interventions.  Due to safety, disease management, medication administration, pain management and patient education the patient requires 24 hour a day rehabilitation nursing.  The patient is currently min to mod assist with mobility and basic ADLs.  Discharge setting and therapy post discharge at home with home health is anticipated.  Patient has agreed to participate in the Acute Inpatient Rehabilitation Program and will admit today.  Preadmission Screen Completed By:  Michel Santee, PT, DPT 09/01/2020 2:48 PM ______________________________________________________________________  Discussed status with Dr. Naaman Plummer on 09/01/20  at 2:59 PM  and received approval for admission today.  Admission Coordinator:  Michel Santee, PT, DPT time 2:59 PM Sudie Grumbling 09/01/20    Assessment/Plan: Diagnosis: GBS 1. Does the need for close, 24 hr/day Medical supervision in concert with the patient's rehab needs make it unreasonable for this patient to be served in  a less intensive setting? Yes 2. Co-Morbidities requiring supervision/potential complications: SIADH, pain 3. Due to bladder management, bowel management, safety, skin/wound care, disease management, medication administration, pain management and patient education, does the patient require 24 hr/day rehab nursing? Yes 4. Does the patient require coordinated care of a physician, rehab nurse, PT, OT, and SLP to address physical and functional deficits in the context of the above medical diagnosis(es)? Yes Addressing deficits in the following areas: balance, endurance, locomotion, strength, transferring, bowel/bladder control, bathing, dressing, feeding, grooming, toileting and psychosocial support 5. Can the patient actively participate in an intensive therapy program of at least 3 hrs of therapy 5 days a week? Yes 6. The potential for patient to make measurable gains while on inpatient rehab is excellent 7. Anticipated functional outcomes upon discharge from inpatient rehab: modified independent PT, modified independent OT, n/a SLP 8. Estimated rehab length of stay to reach the above functional goals is: 7-10 days 9. Anticipated discharge destination: Home 10. Overall Rehab/Functional Prognosis: excellent   MD Signature: Meredith Staggers, MD, [facility] 09/01/2020

## 2020-09-01 NOTE — H&P (Signed)
Physical Medicine and Rehabilitation Admission H&P        Chief Complaint  Patient presents with  . 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 Patient initially refused MRI but was agreeable to have it with sedation. Marland Kitchen MRI brain negative. MRI spine was negative for injury and showed mild spondylosis of cervical, thoracic and lumbar spine. LP recommended for work-up and patient refused this.  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 Low vitamin B 12 levels noted and was started on supplement--1000 mg qd X 7 followed by weekly X 4 and then monthly.    He continued to have issues with pain, nausea and progressive symptoms therefore plasmapheresis recommended and patient/family agreeable therefore dialysis catheter placed on 02/09 by Dr. Mauri Reading and completed 3 doses with minimal improvement and high dose IV steroids added 02/17 due to suspicion of GBS variant with 2 additional doses of PLEX-->last today.  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 Noted to have new onset diabetes with Hgb A1c-7 --doubt cause of neuropathy. He has had decrease in symptoms with improvement in strength but continues to be limtied by weakness with balance deficits. CIR recommended due to functional decline.       Review of Systems  Constitutional: Negative for chills and fever.  HENT: Negative for hearing loss.        Reports dental pain  Eyes: Negative for pain.  Respiratory: Negative for cough.   Cardiovascular: Negative for chest pain and  leg swelling.  Gastrointestinal: Negative for abdominal pain, constipation and heartburn.  Genitourinary: Negative for dysuria and urgency.  Musculoskeletal: Negative for joint pain and myalgias.  Skin: Negative for rash.  Neurological: Positive for sensory change and weakness. Negative for dizziness and headaches.  Psychiatric/Behavioral: The patient is not nervous/anxious.         History reviewed. No pertinent past medical history.             --has been in [place] since 90's and has not had any basic medical care.            Past Surgical History:  Procedure Laterality Date  . IR FLUORO GUIDE CV LINE RIGHT   08/19/2020  . IR US GUIDE VASC ACCESS RIGHT   08/19/2020      Family History:  Mother alive and well. Denies any chronic illness in sibling or parents.       Social History:  Married. Retired. Lives with family. He reports that he has been smoking cigarettes--1-2 per day. He has a 2.00 pack-year smoking history. He has never used smokeless tobacco. He reports that he does not drink alcohol and does not use drugs.      Allergies: No Known Allergies            Medications Prior to Admission  Medication Sig Dispense Refill  . naproxen (NAPROSYN) 375 MG tablet Take 1 tablet twice daily as needed for muscle pain. (Patient not taking: Reported on 08/13/2020) 20 tablet 0      Drug Regimen Review  Drug regimen was reviewed and remains appropriate  with no significant issues identified   Home: Home Living Family/patient expects to be discharged to:: Private residence Living Arrangements: Spouse/significant other,Children (30 y.o. or older sons) Available Help at Discharge: Family,Available 24 hours/day Type of Home: House Home Access: Level entry Home Layout: One level Bathroom Shower/Tub: Engineer, manufacturing systems: Standard Home Equipment: None   Functional History: Prior Function Level of Independence: Independent Comments: Pt does not drive, gets rides from son.  Retired.   Functional Status:  Mobility: Bed Mobility Overal bed mobility: Needs Assistance Bed Mobility: Supine to Sit Supine to sit: Supervision,HOB elevated Sit to supine: Supervision General bed mobility comments: pt utilizes rails to get to edge of bed Transfers Overall transfer level: Needs assistance Equipment used: Rolling walker (2 wheeled) Transfers: Sit to/from Stand Sit to Stand: Min assist,Mod assist Stand pivot transfers: Min assist General transfer comment: pt requires assistance to power up into standing. PT provides cues for use of hands to descend to chair Ambulation/Gait Ambulation/Gait assistance: Min assist Gait Distance (Feet): 40 Feet (additional trial of 30') Assistive device: Rolling walker (2 wheeled) Gait Pattern/deviations: Step-through pattern General Gait Details: pt with slowed step-through gait, pt with intermittent knee hyperextension bilaterally, impaired medial-lateral pelvic control Gait velocity: reduced Gait velocity interpretation: <1.31 ft/sec, indicative of household ambulator   ADL: ADL Overall ADL's : Needs assistance/impaired Eating/Feeding: Set up,Sitting Eating/Feeding Details (indicate cue type and reason): provided pt with built up handles Grooming: Oral care,Sitting,Minimal assistance Grooming Details (indicate cue type and reason): built up handle utilized. Using BUE to switch position of toothbrush due to poor in hand manipulation. Requiring Min A for initating opening tooth paste due to pinch weakness. Upper Body Bathing: Minimal assistance,Sitting Lower Body Bathing: Maximal assistance,Sit to/from stand Upper Body Dressing : Minimal assistance,Sitting Lower Body Dressing: Sit to/from stand,Moderate assistance Lower Body Dressing Details (indicate cue type and reason): Mod A for managing socks and position hands. Poor grasp and frequently slipping away from sock Toilet Transfer: Minimal assistance,Stand-pivot,RW (simulated to  recliner) Toilet Transfer Details (indicate cue type and reason): Min A for power up and maintaining balance in standing Toileting- Clothing Manipulation and Hygiene: Moderate assistance,+2 for physical assistance,+2 for safety/equipment Toileting - Clothing Manipulation Details (indicate cue type and reason): modA+2 for stability while standing as pt completed pericare Functional mobility during ADLs: Minimal assistance,Rolling walker General ADL Comments: Pt continues to present with decreased grasp strength, coorindation, balance, and acitviyt tolerance. Despite fatigue, pt very motivated to partipate in therapy   Cognition: Cognition Overall Cognitive Status: Within Functional Limits for tasks assessed Orientation Level: Oriented X4 Cognition Arousal/Alertness: Awake/alert Behavior During Therapy: WFL for tasks assessed/performed Overall Cognitive Status: Within Functional Limits for tasks assessed General Comments: Very motivated despite pain and fatigue   Physical Exam: Blood pressure 92/61, pulse 79, temperature 97.7 F (36.5 C), temperature source Oral, resp. rate 18, height 5\' 7"  (1.702 m), weight 74 kg, SpO2 95 %. Physical Exam Vitals and nursing note reviewed.  Constitutional:      Appearance: Normal appearance.  HENT:     Head: Normocephalic and atraumatic.     Right Ear: External ear normal.     Left Ear: External ear normal.     Mouth/Throat:     Mouth: Mucous membranes are moist.     Dentition: Dental tenderness present.     Comments: Multiple missing teeth--residual with gum recession.  Eyes:     Extraocular Movements: Extraocular movements intact.     Pupils: Pupils are equal, round, and reactive to  light.  Cardiovascular:     Rate and Rhythm: Normal rate and regular rhythm.     Heart sounds: No murmur heard. No gallop.   Pulmonary:     Effort: Pulmonary effort is normal. No respiratory distress.     Breath sounds: No wheezing.  Abdominal:     General:  Bowel sounds are normal. There is no distension.     Palpations: Abdomen is soft.     Hernia: A hernia (soft, reducible umbilical hernia) is present.  Skin:    General: Skin is warm and dry.  Neurological:     Mental Status: He is alert and oriented to person, place, and time.     Sensory: Sensory deficit present.     Comments: Alert and oriented x 3. Normal insight and awareness. Intact Memory. Normal language and speech. Cranial nerve exam unremarkable. Sensory loss in upper extremities up to deltoid area and in lower area to proximal thigh. Is able to sense gross touch but has difficulty LT and proprioception. DTR's absent.   Psychiatric:        Mood and Affect: Mood normal.        Behavior: Behavior normal.        Thought Content: Thought content normal.        Judgment: Judgment normal.        Lab Results Last 48 Hours        Results for orders placed or performed during the hospital encounter of 08/12/20 (from the past 48 hour(s))  Glucose, capillary     Status: Abnormal    Collection Time: 08/30/20 12:06 PM  Result Value Ref Range    Glucose-Capillary 137 (H) 70 - 99 mg/dL      Comment: Glucose reference range applies only to samples taken after fasting for at least 8 hours.  Glucose, capillary     Status: Abnormal    Collection Time: 08/30/20  5:06 PM  Result Value Ref Range    Glucose-Capillary 160 (H) 70 - 99 mg/dL      Comment: Glucose reference range applies only to samples taken after fasting for at least 8 hours.  Glucose, capillary     Status: Abnormal    Collection Time: 08/30/20  9:03 PM  Result Value Ref Range    Glucose-Capillary 178 (H) 70 - 99 mg/dL      Comment: Glucose reference range applies only to samples taken after fasting for at least 8 hours.  Glucose, capillary     Status: Abnormal    Collection Time: 08/31/20  7:12 AM  Result Value Ref Range    Glucose-Capillary 174 (H) 70 - 99 mg/dL      Comment: Glucose reference range applies only to samples  taken after fasting for at least 8 hours.    Comment 1 Notify RN      Comment 2 Document in Chart    Glucose, capillary     Status: Abnormal    Collection Time: 08/31/20 12:04 PM  Result Value Ref Range    Glucose-Capillary 235 (H) 70 - 99 mg/dL      Comment: Glucose reference range applies only to samples taken after fasting for at least 8 hours.  Glucose, capillary     Status: Abnormal    Collection Time: 08/31/20  6:06 PM  Result Value Ref Range    Glucose-Capillary 163 (H) 70 - 99 mg/dL      Comment: Glucose reference range applies only to samples taken after fasting for at least  8 hours.  Glucose, capillary     Status: Abnormal    Collection Time: 08/31/20  9:16 PM  Result Value Ref Range    Glucose-Capillary 177 (H) 70 - 99 mg/dL      Comment: Glucose reference range applies only to samples taken after fasting for at least 8 hours.    Comment 1 Notify RN      Comment 2 Document in Chart    CBC     Status: Abnormal    Collection Time: 09/01/20  4:07 AM  Result Value Ref Range    WBC 18.4 (H) 4.0 - 10.5 K/uL    RBC 3.78 (L) 4.22 - 5.81 MIL/uL    Hemoglobin 10.7 (L) 13.0 - 17.0 g/dL    HCT 41.9 (L) 37.9 - 52.0 %    MCV 82.8 80.0 - 100.0 fL    MCH 28.3 26.0 - 34.0 pg    MCHC 34.2 30.0 - 36.0 g/dL    RDW 02.4 09.7 - 35.3 %    Platelets 373 150 - 400 K/uL    nRBC 0.0 0.0 - 0.2 %      Comment: Performed at [facility], [address] [place], [place] [address]  Basic metabolic panel     Status: Abnormal    Collection Time: 09/01/20  4:07 AM  Result Value Ref Range    Sodium 134 (L) 135 - 145 mmol/L    Potassium 4.3 3.5 - 5.1 mmol/L    Chloride 100 98 - 111 mmol/L    CO2 24 22 - 32 mmol/L    Glucose, Bld 189 (H) 70 - 99 mg/dL      Comment: Glucose reference range applies only to samples taken after fasting for at least 8 hours.    BUN 29 (H) 8 - 23 mg/dL    Creatinine, Ser 2.68 0.61 - 1.24 mg/dL    Calcium 8.5 (L) 8.9 - 10.3 mg/dL    GFR, Estimated >34 >19 mL/min       Comment: (NOTE) Calculated using the CKD-EPI Creatinine Equation (2021)      Anion gap 10 5 - 15      Comment: Performed at [facility], [address], [place], [place] [address]  Glucose, capillary     Status: Abnormal    Collection Time: 09/01/20  6:11 AM  Result Value Ref Range    Glucose-Capillary 186 (H) 70 - 99 mg/dL      Comment: Glucose reference range applies only to samples taken after fasting for at least 8 hours.    Comment 1 Notify RN      Comment 2 Document in Chart        Imaging Results (Last 48 hours)  No results found.           Medical Problem List and Plan: 1.  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  Antithrombotics: -DVT/anticoagulation:  Pharmaceutical: Lovenox             -antiplatelet therapy: N/A 3. Pain Management: Gabapentin tid effective 4. Mood: LCSW to follow for evaluation and support.              -antipsychotic agents: N/A 5. Neuropsych: This patient is capable of making decisions on his own  behalf. 6. Skin/Wound Care: routine pressure relief measures.  7. Fluids/Electrolytes/Nutrition: Monitor I/O. Check lytes in am. 8. Dental pain: Ibuprofen prn effective.              --Educated on importance of oral care due to recessed gums.             --Will order peridex bid 9. T2DM-new diagnosis:  Hgb A1c-7.0--will consult dietician to educate on CM diet             --Monitor BS ac/hs with SSI for elevated BS 10. Vitamin B 12 deficiency: Being supplemented 11. Hyponatremia: Slowly improving from 129-->134 today. Continue to monitor. 12. Leucocytosis:  Likely reactive and improving off steroids.              --Monitor for fevers and other signs of infection.            Jacquelynn Cree, PA-C 09/01/2020  I have personally performed a face to face diagnostic evaluation of this patient and formulated the key components of the  plan.  Additionally, I have personally reviewed laboratory data, imaging studies, as well as relevant notes and concur with the physician assistant's documentation above.  The patient's status has not changed from the original H&P.  Any changes in documentation from the acute care chart have been noted above.  Ranelle Oyster, MD, [place]

## 2020-09-01 NOTE — Progress Notes (Signed)
Meredith Staggers, MD  Physician  Physical Medicine and Rehabilitation  PMR Pre-admission     Signed  Date of Service:  09/01/2020 2:48 PM      Related encounter: ED to Hosp-Admission (Discharged) from 08/12/2020 in [facility]       Signed         Show:Clear all _0 Manual_1 Template_2 Copied  Added by: _3 Meredith Staggers, MD_4 Michel Santee, PT   _5 Hover for details  PMR Admission Coordinator Pre-Admission Assessment  Patient: Jacob Cameron is an 65 y.o., male MRN: [id number] DOB: 1955-07-26 Height: _6  (170.2 cm) Weight: 74 kg  Insurance Information HMO:     PPO:      PCP:      IPA:      80/20:      OTHER:  PRIMARY: Medicare A and B      Policy#: [id number]      Subscriber: pt CM Name:       Phone#:      Fax#:  Pre-Cert#: verified Civil engineer, contracting:  Benefits:  Phone #:      Name:  Eff. Date: 02/09/19 A and B     Deduct: $1556      Out of Pocket Max: n/a      Life Max: n/a CIR: 100%      SNF: 20 full days Outpatient: 80%     Co-Pay: 20% Home Health: 100%      Co-Pay:  DME: 80%     Co-Pay: 20% Providers:  SECONDARY:       Policy#:      Phone#:   Development worker, community:       Phone#:   The Therapist, art Information Summary" for patients in Inpatient Rehabilitation Facilities with attached "Privacy Act [place] Records" was provided and verbally reviewed with: Patient and Family  Emergency Contact Information         Contact Information    Name Relation Home Work [place], [place] Son [phone]  [phone]   Dametri, [place] [phone]  [phone]      Current Medical History  Patient Admitting Diagnosis: Guillion Barre Syndrome  History of Present Illness: Jacob Cameron is a 65 year old male in relatively good health who was admitted on 08/12/2020 with bilateral lower extremity and right upper extremity paresthesias with shooting painswith BLE weakness. Patient initially refused MRI but  was agreeable to have it with sedation.MRI brain negative. MRI spine was negative for injury and showed mild spondylosis of cervical, thoracic and lumbar spine.LP recommended for work-up and patient refused this. 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   He continued to have issues with pain, nausea and progressive symptoms therefore plasmapheresis recommended and patient/family agreeable, therefore dialysis catheter placed on 02/09 by Dr. 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 Additional work up --Sjogren's antibody panel, urine for heavy metals, RF, ANA urine electrophoresis-->all negative. SIADH treated with fluid restriction. Noted to have new onset diabetes with Hgb A1c-7 --doubt cause of neuropathy. He has had decrease in symptoms with improvement in strength but continues  to be limtied by weakness with balance deficits. CIR recommended due to functional decline.   Complete NIHSS TOTAL: 5  Patient's medical record from [facility] has been reviewed by the rehabilitation admission coordinator and physician.  Past Medical History  History reviewed. No pertinent past medical history.  Family History   family history is not on file.  Prior Rehab/Hospitalizations Has the patient had prior rehab or hospitalizations prior to admission? No  Has the patient had major surgery during 100 days prior to admission? No              Current Medications  Current Facility-Administered Medications:  .  acetaminophen (TYLENOL) tablet 650 mg, 650 mg,  Oral, Q4H PRN, 650 mg at 08/29/20 2233 **OR** acetaminophen (TYLENOL) 160 MG/5ML solution 650 mg, 650 mg, Per Tube, Q4H PRN **OR** acetaminophen (TYLENOL) suppository 650 mg, 650 mg, Rectal, Q4H PRN, Karmen Bongo, MD .  acetaminophen (TYLENOL) tablet 650 mg, 650 mg, Oral, Q4H PRN, Kerney Elbe, MD .  benzonatate (TESSALON) capsule 100 mg, 100 mg, Oral, TID PRN, Donne Hazel, MD, 100 mg at 08/22/20 1741 .  Chlorhexidine Gluconate Cloth 2 % PADS 6 each, 6 each, Topical, Daily, Donne Hazel, MD, 6 each at 09/01/20 1426 .  cyanocobalamin ((VITAMIN B-12)) injection 1,000 mcg, 1,000 mcg, Intramuscular, Q7 days, Kerney Elbe, MD, 1,000 mcg at 08/31/20 0809 .  diphenhydrAMINE (BENADRYL) capsule 25 mg, 25 mg, Oral, Q6H PRN, Kerney Elbe, MD .  enoxaparin (LOVENOX) injection 40 mg, 40 mg, Subcutaneous, Q24H, Karmen Bongo, MD, 40 mg at 08/31/20 1738 .  gabapentin (NEURONTIN) capsule 200 mg, 200 mg, Oral, TID, Donne Hazel, MD, 200 mg at 09/01/20 0841 .  hydrALAZINE (APRESOLINE) injection 5 mg, 5 mg, Intravenous, Q4H PRN, Shela Leff, MD .  HYDROmorphone (DILAUDID) injection 1 mg, 1 mg, Intravenous, Q3H PRN, Manuella Ghazi, Pratik D, DO, 1 mg at 09/01/20 0636 .  ibuprofen (ADVIL) tablet 600 mg, 600 mg, Oral, Q4H PRN, Pahwani, Rinka R, MD, 600 mg at 09/01/20 1053 .  insulin aspart (novoLOG) injection 0-6 Units, 0-6 Units, Subcutaneous, TID WC, Patrecia Pour, MD, 1 Units at 09/01/20 1225 .  lidocaine (PF) (XYLOCAINE) 1 % injection, , , PRN, Mir, Paula Libra, MD, 5 mL at 08/19/20 1549 .  ondansetron (ZOFRAN) injection 4 mg, 4 mg, Intravenous, Q6H PRN, Karmen Bongo, MD, 4 mg at 08/30/20 1230 .  oxyCODONE (Oxy IR/ROXICODONE) immediate release tablet 5 mg, 5 mg, Oral, Q4H PRN, Patrecia Pour, MD, 5 mg at 08/31/20 2149 .  pantoprazole (PROTONIX) EC tablet 40 mg, 40 mg, Oral, Daily, Pahwani, Rinka R, MD, 40 mg at 09/01/20 0840 .  polyethylene glycol (MIRALAX / GLYCOLAX) packet 17 g, 17 g, Oral, Daily,  Pahwani, Rinka R, MD, 17 g at 09/01/20 0840 .  rosuvastatin (CRESTOR) tablet 5 mg, 5 mg, Oral, Daily, Patrecia Pour, MD, 5 mg at 09/01/20 0841 .  senna-docusate (Senokot-S) tablet 1 tablet, 1 tablet, Oral, QHS PRN, Karmen Bongo, MD, 1 tablet at 08/17/20 0529 .  zolpidem (AMBIEN) tablet 5 mg, 5 mg, Oral, QHS PRN, Donne Hazel, MD  Patients Current Diet:     Diet Order                  Diet heart healthy/carb modified Room service appropriate? Yes; Fluid consistency: Thin; Fluid restriction: 1500 mL Fluid  Diet effective now                  Precautions /  Restrictions Precautions Precautions: Fall Precaution Comments: Translator helpful but pt speaks pretty good English Restrictions Weight Bearing Restrictions: No   Has the patient had 2 or more falls or a fall with injury in the past year? Yes  Prior Activity Level Limited Community (1-2x/wk): did not drive prior to admit, no DME used, independent with mobility  Prior Functional Level Self Care: Did the patient need help bathing, dressing, using the toilet or eating? Independent  Indoor Mobility: Did the patient need assistance with walking from room to room (with or without device)? Independent  Stairs: Did the patient need assistance with internal or external stairs (with or without device)? Independent  Functional Cognition: Did the patient need help planning regular tasks such as shopping or remembering to take medications? Independent  Home Assistive Devices / Equipment Home Assistive Devices/Equipment: None Home Equipment: None  Prior Device Use: Indicate devices/aids used by the patient prior to current illness, exacerbation or injury? None of the above  Current Functional Level Cognition  Overall Cognitive Status: Within Functional Limits for tasks assessed Orientation Level: Oriented X4 General Comments: Very motivated despite pain and fatigue    Extremity Assessment (includes  Sensation/Coordination)  Upper Extremity Assessment: RUE deficits/detail,LUE deficits/detail RUE Deficits / Details: Poor strength and coorindation. R weaker than L. Pt with difficulty opening containers. Poor grasp and pinch. Reporting "needle" feeling RUE Sensation:  ("needles") RUE Coordination: decreased fine motor,decreased gross motor LUE Deficits / Details: Weakness and poor coorindation. Decreased grasp and pinch strength. LUE Coordination: decreased fine motor,decreased gross motor  Lower Extremity Assessment: Defer to PT evaluation RLE Deficits / Details: MMT scores of the following, good strength but poor ability maintain contraction as muscle would contract release and repeat: hip flexion 4-, knee extension 5, knee flexion 4+, ankle dorsiflexion 4 RLE Sensation: decreased light touch (pins/needles throughout, worse from knees and below) RLE Coordination: decreased fine motor,decreased gross motor LLE Deficits / Details: MMT scores of the following, good strength but poor ability maintain contraction as muscle would contract release and repeat: hip flexion 4-, knee extension 5, knee flexion 4+, ankle dorsiflexion 4 LLE Sensation: decreased light touch (pins/needles throughout, worse from knees and below) LLE Coordination: decreased fine motor,decreased gross motor    ADLs  Overall ADL's : Needs assistance/impaired Eating/Feeding: Set up,Sitting Eating/Feeding Details (indicate cue type and reason): provided pt with built up handles Grooming: Oral care,Sitting,Minimal assistance Grooming Details (indicate cue type and reason): built up handle utilized. Using BUE to switch position of toothbrush due to poor in hand manipulation. Requiring Min A for initating opening tooth paste due to pinch weakness. Upper Body Bathing: Minimal assistance,Sitting Lower Body Bathing: Maximal assistance,Sit to/from stand Upper Body Dressing : Minimal assistance,Sitting Lower Body Dressing: Sit  to/from stand,Moderate assistance Lower Body Dressing Details (indicate cue type and reason): Mod A for managing socks and position hands. Poor grasp and frequently slipping away from sock Toilet Transfer: Minimal assistance,Stand-pivot,RW (simulated to recliner) Toilet Transfer Details (indicate cue type and reason): Min A for power up and maintaining balance in standing Toileting- Clothing Manipulation and Hygiene: Moderate assistance,+2 for physical assistance,+2 for safety/equipment Toileting - Clothing Manipulation Details (indicate cue type and reason): modA+2 for stability while standing as pt completed pericare Functional mobility during ADLs: Minimal assistance,Rolling walker General ADL Comments: Pt continues to present with decreased grasp strength, coorindation, balance, and acitviyt tolerance. Despite fatigue, pt very motivated to partipate in therapy    Mobility  Overal bed mobility: Needs Assistance Bed Mobility: Supine to Sit  Supine to sit: Supervision,HOB elevated Sit to supine: Supervision General bed mobility comments: pt utilizes rails to get to edge of bed    Transfers  Overall transfer level: Needs assistance Equipment used: Rolling walker (2 wheeled) Transfers: Sit to/from Stand Sit to Stand: Min assist,Mod assist Stand pivot transfers: Min assist General transfer comment: pt requires assistance to power up into standing from EOB x2, from chair x2. Cues for hand placement and technique.    Ambulation / Gait / Stairs / Wheelchair Mobility  Ambulation/Gait Ambulation/Gait assistance: Herbalist (Feet): 40 Feet (+ 60') Assistive device: Rolling walker (2 wheeled) Gait Pattern/deviations: Step-through pattern,Decreased step length - left,Narrow base of support,Trunk flexed General Gait Details: pt with slowed step-through gait with uncontrolled inital contact bilaterally with increased step length RLE; less knee hyperextension noted today, impaired  medial-lateral pelvic control Gait velocity: reduced Gait velocity interpretation: <1.31 ft/sec, indicative of household ambulator    Posture / Balance Dynamic Sitting Balance Sitting balance - Comments: Supervision for safety Balance Overall balance assessment: Needs assistance Sitting-balance support: Feet supported,No upper extremity supported Sitting balance-Leahy Scale: Good Sitting balance - Comments: Supervision for safety Postural control: Other (comment) (anterior lean in standing) Standing balance support: During functional activity Standing balance-Leahy Scale: Poor Standing balance comment: reliant on BUE support of RW; stood for ~3 minutes to adjust pants.    Special needs/care consideration Diabetic management yes (new diabetic) and Special service needs possibility of 1 remaining PLEX treatment on 2/23 if he does not complete final treatment on 2/22.  Understands English fairly well but would do better with interpreter   Previous Home Environment (from acute therapy documentation) Living Arrangements: Spouse/significant other,Children (94 y.o. or older sons) Available Help at Discharge: Family,Available 24 hours/day Type of Home: [place]: One level Home Access: Level entry Bathroom Shower/Tub: Chiropodist: Standard  Discharge Living Setting Plans for Discharge Living Setting: Patient's home Type of Home at Discharge: House Discharge Home Layout: One level Discharge Home Access: Level entry Discharge Bathroom Shower/Tub: [place] unit Discharge Bathroom Toilet: Standard Discharge Manter Accessibility: Yes How Accessible: Accessible via walker Does the patient have any problems obtaining your medications?: No  Social/Family/Support Systems Patient Roles: Spouse Anticipated Caregiver: 2 adult sons Leigh Aurora and [place]) and his wife Anticipated Caregiver's Contact Information: Leigh Aurora [phone]  [phone] Ability/Limitations of Caregiver: n/a Caregiver Availability: 24/7 Discharge Plan Discussed with Primary Caregiver: Yes Is Caregiver In Agreement with Plan?: Yes Does Caregiver/Family have Issues with Lodging/Transportation while Pt is in Rehab?: No  Goals Patient/Family Goal for Rehab: PT/OT mod I, SLP n/a Expected length of stay: 7-10 days Cultural Considerations: [place], Urdu is first language, ESL Additional Information: interpreter requested for initial therapy evals, CSW copied for ongoing interpreting needs Pt/Family Agrees to Admission and willing to participate: Yes Program Orientation Provided & Reviewed with Pt/Caregiver Including Roles  & Responsibilities: Yes  Decrease burden of Care through IP rehab admission: n/a Possible need for SNF placement upon discharge: not anticipated.  Pt with good family support and mod I goals.   Patient Condition: I have reviewed medical records from [facility], spoken with CM, and patient and son. I met with patient at the bedside for inpatient rehabilitation assessment.  Patient will benefit from ongoing PT and OT, can actively participate in 3 hours of therapy a day 5 days of the week, and can make measurable gains during the admission.  Patient will also benefit from the coordinated team approach during an Inpatient Acute  Rehabilitation admission.  The patient will receive intensive therapy as well as Rehabilitation physician, nursing, social worker, and care management interventions.  Due to safety, disease management, medication administration, pain management and patient education the patient requires 24 hour a day rehabilitation nursing.  The patient is currently min to mod assist with mobility and basic ADLs.  Discharge setting and therapy post discharge at home with home health is anticipated.  Patient has agreed to participate in the Acute Inpatient Rehabilitation Program and will admit today.  Preadmission Screen  Completed By:  Michel Santee, PT, DPT 09/01/2020 2:48 PM ______________________________________________________________________   Discussed status with Dr. Naaman Plummer on 09/01/20  at 2:59 PM  and received approval for admission today.  Admission Coordinator:  Michel Santee, PT, DPT time 2:59 PM Sudie Grumbling 09/01/20    Assessment/Plan: Diagnosis: GBS 1. Does the need for close, 24 hr/day Medical supervision in concert with the patient's rehab needs make it unreasonable for this patient to be served in a less intensive setting? Yes 2. Co-Morbidities requiring supervision/potential complications: SIADH, pain 3. Due to bladder management, bowel management, safety, skin/wound care, disease management, medication administration, pain management and patient education, does the patient require 24 hr/day rehab nursing? Yes 4. Does the patient require coordinated care of a physician, rehab nurse, PT, OT, and SLP to address physical and functional deficits in the context of the above medical diagnosis(es)? Yes Addressing deficits in the following areas: balance, endurance, locomotion, strength, transferring, bowel/bladder control, bathing, dressing, feeding, grooming, toileting and psychosocial support 5. Can the patient actively participate in an intensive therapy program of at least 3 hrs of therapy 5 days a week? Yes 6. The potential for patient to make measurable gains while on inpatient rehab is excellent 7. Anticipated functional outcomes upon discharge from inpatient rehab: modified independent PT, modified independent OT, n/a SLP 8. Estimated rehab length of stay to reach the above functional goals is: 7-10 days 9. Anticipated discharge destination: Home 10. Overall Rehab/Functional Prognosis: excellent   MD Signature: Meredith Staggers, MD, [facility] 09/01/2020           Revision History                        Note Details  Author  Meredith Staggers, MD File Time 09/01/2020 3:15 PM  Author Type Physician Status Signed  Last Editor Meredith Staggers, MD Service Physical Medicine and Rehabilitation      Meredith Staggers, MD  Physician  Physical Medicine and Rehabilitation  PMR Pre-admission     Signed  Date of Service:  09/01/2020 2:48 PM      Related encounter: ED to Hosp-Admission (Discharged) from 08/12/2020 in [facility]       Signed         Show:Clear all _0 Manual_1 Template_2 Copied  Added by: _3 Meredith Staggers, MD_4 Michel Santee, PT   _5 Hover for details  PMR Admission Coordinator Pre-Admission Assessment  Patient: Jacob Cameron is an 65 y.o., male MRN: [id number] DOB: August 01, 1955 Height: _6  (170.2 cm) Weight: 74 kg  Insurance Information HMO:     PPO:      PCP:      IPA:      80/20:      OTHER:  PRIMARY: Medicare A and B      Policy#: [id number]      Subscriber: pt CM Name:       Phone#:  Fax#:  Pre-Cert#: verified Civil engineer, contracting:  Benefits:  Phone #:      Name:  Eff. Date: 02/09/19 A and B     Deduct: $1556      Out of Pocket Max: n/a      Life Max: n/a CIR: 100%      SNF: 20 full days Outpatient: 80%     Co-Pay: 20% Home Health: 100%      Co-Pay:  DME: 80%     Co-Pay: 20% Providers:  SECONDARY:       Policy#:      Phone#:   Development worker, community:       Phone#:   The Therapist, art Information Summary" for patients in Inpatient Rehabilitation Facilities with attached "Privacy Act [place] Records" was provided and verbally reviewed with: Patient and Family  Emergency Contact Information         Contact Information    Name Relation Home Work [place], [place] Son [phone]  [phone]   [place], [place] [phone]  [phone]      Current Medical History  Patient Admitting Diagnosis: Guillion Barre Syndrome  History of Present Illness: Johncarlo Maalouf is a 65 year old male in relatively good health  who was admitted on 08/12/2020 with bilateral lower extremity and right upper extremity paresthesias with shooting painswith BLE weakness. Patient initially refused MRI but was agreeable to have it with sedation.MRI brain negative. MRI spine was negative for injury and showed mild spondylosis of cervical, thoracic and lumbar spine.LP recommended for work-up and patient refused this. 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   He continued to have issues with pain, nausea and progressive symptoms therefore plasmapheresis recommended and patient/family agreeable, therefore dialysis catheter placed on 02/09 by Dr. 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 Additional work up --Sjogren's antibody panel, urine for heavy metals, RF, ANA urine electrophoresis-->all negative. SIADH treated with fluid restriction. Noted to have new onset diabetes with Hgb A1c-7 --doubt cause of neuropathy. He has had decrease in symptoms with improvement in strength but continues to be limtied by weakness with balance deficits. CIR recommended due to functional decline.   Complete NIHSS TOTAL: 5  Patient's medical record from [facility] has been reviewed by the rehabilitation admission coordinator and physician.  Past Medical History  History reviewed. No pertinent past medical history.  Family History   family history is not on file.  Prior Rehab/Hospitalizations Has the patient had prior rehab or hospitalizations prior to admission? No  Has the patient had major surgery  during 100 days prior to admission? No              Current Medications  Current Facility-Administered Medications:  .  acetaminophen (TYLENOL) tablet 650 mg, 650 mg, Oral, Q4H PRN, 650 mg at 08/29/20 2233 **OR** acetaminophen (TYLENOL) 160 MG/5ML solution 650 mg, 650 mg, Per Tube, Q4H PRN **OR** acetaminophen (TYLENOL) suppository 650 mg, 650 mg, Rectal, Q4H PRN, Karmen Bongo, MD .  acetaminophen (TYLENOL) tablet 650 mg, 650 mg, Oral, Q4H PRN, Kerney Elbe, MD .  benzonatate (TESSALON)  capsule 100 mg, 100 mg, Oral, TID PRN, Donne Hazel, MD, 100 mg at 08/22/20 1741 .  Chlorhexidine Gluconate Cloth 2 % PADS 6 each, 6 each, Topical, Daily, Donne Hazel, MD, 6 each at 09/01/20 1426 .  cyanocobalamin ((VITAMIN B-12)) injection 1,000 mcg, 1,000 mcg, Intramuscular, Q7 days, Kerney Elbe, MD, 1,000 mcg at 08/31/20 0809 .  diphenhydrAMINE (BENADRYL) capsule 25 mg, 25 mg, Oral, Q6H PRN, Kerney Elbe, MD .  enoxaparin (LOVENOX) injection 40 mg, 40 mg, Subcutaneous, Q24H, Karmen Bongo, MD, 40 mg at 08/31/20 1738 .  gabapentin (NEURONTIN) capsule 200 mg, 200 mg, Oral, TID, Donne Hazel, MD, 200 mg at 09/01/20 0841 .  hydrALAZINE (APRESOLINE) injection 5 mg, 5 mg, Intravenous, Q4H PRN, Shela Leff, MD .  HYDROmorphone (DILAUDID) injection 1 mg, 1 mg, Intravenous, Q3H PRN, Manuella Ghazi, Pratik D, DO, 1 mg at 09/01/20 0636 .  ibuprofen (ADVIL) tablet 600 mg, 600 mg, Oral, Q4H PRN, Pahwani, Rinka R, MD, 600 mg at 09/01/20 1053 .  insulin aspart (novoLOG) injection 0-6 Units, 0-6 Units, Subcutaneous, TID WC, Patrecia Pour, MD, 1 Units at 09/01/20 1225 .  lidocaine (PF) (XYLOCAINE) 1 % injection, , , PRN, Mir, Paula Libra, MD, 5 mL at 08/19/20 1549 .  ondansetron (ZOFRAN) injection 4 mg, 4 mg, Intravenous, Q6H PRN, Karmen Bongo, MD, 4 mg at 08/30/20 1230 .  oxyCODONE (Oxy IR/ROXICODONE) immediate release tablet 5 mg, 5 mg, Oral, Q4H PRN, Patrecia Pour, MD, 5 mg at 08/31/20 2149 .  pantoprazole  (PROTONIX) EC tablet 40 mg, 40 mg, Oral, Daily, Pahwani, Rinka R, MD, 40 mg at 09/01/20 0840 .  polyethylene glycol (MIRALAX / GLYCOLAX) packet 17 g, 17 g, Oral, Daily, Pahwani, Rinka R, MD, 17 g at 09/01/20 0840 .  rosuvastatin (CRESTOR) tablet 5 mg, 5 mg, Oral, Daily, Patrecia Pour, MD, 5 mg at 09/01/20 0841 .  senna-docusate (Senokot-S) tablet 1 tablet, 1 tablet, Oral, QHS PRN, Karmen Bongo, MD, 1 tablet at 08/17/20 0529 .  zolpidem (AMBIEN) tablet 5 mg, 5 mg, Oral, QHS PRN, Donne Hazel, MD  Patients Current Diet:     Diet Order                  Diet heart healthy/carb modified Room service appropriate? Yes; Fluid consistency: Thin; Fluid restriction: 1500 mL Fluid  Diet effective now                  Precautions / Restrictions Precautions Precautions: Fall Precaution Comments: Translator helpful but pt speaks pretty good English Restrictions Weight Bearing Restrictions: No   Has the patient had 2 or more falls or a fall with injury in the past year? Yes  Prior Activity Level Limited Community (1-2x/wk): did not drive prior to admit, no DME used, independent with mobility  Prior Functional Level Self Care: Did the patient need help bathing, dressing, using the toilet or eating? Independent  Indoor Mobility: Did the patient need assistance with walking from room to room (with or without device)? Independent  Stairs: Did the patient need assistance with internal or external stairs (with or without device)? Independent  Functional Cognition: Did the patient need help planning regular tasks such as shopping or remembering to take medications? Independent  Home Assistive Devices / Equipment Home Assistive Devices/Equipment: None Home Equipment: None  Prior Device Use: Indicate devices/aids used by the patient prior to current illness, exacerbation or injury? None of the above  Current Functional Level Cognition  Overall Cognitive  Status: Within  Functional Limits for tasks assessed Orientation Level: Oriented X4 General Comments: Very motivated despite pain and fatigue    Extremity Assessment (includes Sensation/Coordination)  Upper Extremity Assessment: RUE deficits/detail,LUE deficits/detail RUE Deficits / Details: Poor strength and coorindation. R weaker than L. Pt with difficulty opening containers. Poor grasp and pinch. Reporting "needle" feeling RUE Sensation:  ("needles") RUE Coordination: decreased fine motor,decreased gross motor LUE Deficits / Details: Weakness and poor coorindation. Decreased grasp and pinch strength. LUE Coordination: decreased fine motor,decreased gross motor  Lower Extremity Assessment: Defer to PT evaluation RLE Deficits / Details: MMT scores of the following, good strength but poor ability maintain contraction as muscle would contract release and repeat: hip flexion 4-, knee extension 5, knee flexion 4+, ankle dorsiflexion 4 RLE Sensation: decreased light touch (pins/needles throughout, worse from knees and below) RLE Coordination: decreased fine motor,decreased gross motor LLE Deficits / Details: MMT scores of the following, good strength but poor ability maintain contraction as muscle would contract release and repeat: hip flexion 4-, knee extension 5, knee flexion 4+, ankle dorsiflexion 4 LLE Sensation: decreased light touch (pins/needles throughout, worse from knees and below) LLE Coordination: decreased fine motor,decreased gross motor    ADLs  Overall ADL's : Needs assistance/impaired Eating/Feeding: Set up,Sitting Eating/Feeding Details (indicate cue type and reason): provided pt with built up handles Grooming: Oral care,Sitting,Minimal assistance Grooming Details (indicate cue type and reason): built up handle utilized. Using BUE to switch position of toothbrush due to poor in hand manipulation. Requiring Min A for initating opening tooth paste due to pinch weakness. Upper Body Bathing:  Minimal assistance,Sitting Lower Body Bathing: Maximal assistance,Sit to/from stand Upper Body Dressing : Minimal assistance,Sitting Lower Body Dressing: Sit to/from stand,Moderate assistance Lower Body Dressing Details (indicate cue type and reason): Mod A for managing socks and position hands. Poor grasp and frequently slipping away from sock Toilet Transfer: Minimal assistance,Stand-pivot,RW (simulated to recliner) Toilet Transfer Details (indicate cue type and reason): Min A for power up and maintaining balance in standing Toileting- Clothing Manipulation and Hygiene: Moderate assistance,+2 for physical assistance,+2 for safety/equipment Toileting - Clothing Manipulation Details (indicate cue type and reason): modA+2 for stability while standing as pt completed pericare Functional mobility during ADLs: Minimal assistance,Rolling walker General ADL Comments: Pt continues to present with decreased grasp strength, coorindation, balance, and acitviyt tolerance. Despite fatigue, pt very motivated to partipate in therapy    Mobility  Overal bed mobility: Needs Assistance Bed Mobility: Supine to Sit Supine to sit: Supervision,HOB elevated Sit to supine: Supervision General bed mobility comments: pt utilizes rails to get to edge of bed    Transfers  Overall transfer level: Needs assistance Equipment used: Rolling walker (2 wheeled) Transfers: Sit to/from Stand Sit to Stand: Min assist,Mod assist Stand pivot transfers: Min assist General transfer comment: pt requires assistance to power up into standing from EOB x2, from chair x2. Cues for hand placement and technique.    Ambulation / Gait / Stairs / Wheelchair Mobility  Ambulation/Gait Ambulation/Gait assistance: Herbalist (Feet): 40 Feet (+ 60') Assistive device: Rolling walker (2 wheeled) Gait Pattern/deviations: Step-through pattern,Decreased step length - left,Narrow base of support,Trunk flexed General Gait  Details: pt with slowed step-through gait with uncontrolled inital contact bilaterally with increased step length RLE; less knee hyperextension noted today, impaired medial-lateral pelvic control Gait velocity: reduced Gait velocity interpretation: <1.31 ft/sec, indicative of household ambulator    Posture / Balance Dynamic Sitting Balance Sitting balance - Comments: Supervision for safety Balance  Overall balance assessment: Needs assistance Sitting-balance support: Feet supported,No upper extremity supported Sitting balance-Leahy Scale: Good Sitting balance - Comments: Supervision for safety Postural control: Other (comment) (anterior lean in standing) Standing balance support: During functional activity Standing balance-Leahy Scale: Poor Standing balance comment: reliant on BUE support of RW; stood for ~3 minutes to adjust pants.    Special needs/care consideration Diabetic management yes (new diabetic) and Special service needs possibility of 1 remaining PLEX treatment on 2/23 if he does not complete final treatment on 2/22.  Understands English fairly well but would do better with interpreter   Previous Home Environment (from acute therapy documentation) Living Arrangements: Spouse/significant other,Children (7 y.o. or older sons) Available Help at Discharge: Family,Available 24 hours/day Type of Home: [place]: One level Home Access: Level entry Bathroom Shower/Tub: Chiropodist: Standard  Discharge Living Setting Plans for Discharge Living Setting: Patient's home Type of Home at Discharge: House Discharge Home Layout: One level Discharge Home Access: Level entry Discharge Bathroom Shower/Tub: Aneth unit Discharge Bathroom Toilet: Standard Discharge Obion Accessibility: Yes How Accessible: Accessible via walker Does the patient have any problems obtaining your medications?: No  Social/Family/Support Systems Patient Roles:  Spouse Anticipated Caregiver: 2 adult sons Leigh Aurora and Hayden) and his wife Anticipated Caregiver's Contact Information: Leigh Aurora [phone] Ability/Limitations of Caregiver: n/a Caregiver Availability: 24/7 Discharge Plan Discussed with Primary Caregiver: Yes Is Caregiver In Agreement with Plan?: Yes Does Caregiver/Family have Issues with Lodging/Transportation while Pt is in Rehab?: No  Goals Patient/Family Goal for Rehab: PT/OT mod I, SLP n/a Expected length of stay: 7-10 days Cultural Considerations: [place], Urdu is first language, ESL Additional Information: interpreter requested for initial therapy evals, CSW copied for ongoing interpreting needs Pt/Family Agrees to Admission and willing to participate: Yes Program Orientation Provided & Reviewed with Pt/Caregiver Including Roles  & Responsibilities: Yes  Decrease burden of Care through IP rehab admission: n/a Possible need for SNF placement upon discharge: not anticipated.  Pt with good family support and mod I goals.   Patient Condition: I have reviewed medical records from [facility], spoken with CM, and patient and son. I met with patient at the bedside for inpatient rehabilitation assessment.  Patient will benefit from ongoing PT and OT, can actively participate in 3 hours of therapy a day 5 days of the week, and can make measurable gains during the admission.  Patient will also benefit from the coordinated team approach during an Inpatient Acute Rehabilitation admission.  The patient will receive intensive therapy as well as Rehabilitation physician, nursing, social worker, and care management interventions.  Due to safety, disease management, medication administration, pain management and patient education the patient requires 24 hour a day rehabilitation nursing.  The patient is currently min to mod assist with mobility and basic ADLs.  Discharge setting and therapy post discharge at home with  home health is anticipated.  Patient has agreed to participate in the Acute Inpatient Rehabilitation Program and will admit today.  Preadmission Screen Completed By:  Michel Santee, PT, DPT 09/01/2020 2:48 PM ______________________________________________________________________   Discussed status with Dr. Naaman Plummer on 09/01/20  at 2:59 PM  and received approval for admission today.  Admission Coordinator:  Michel Santee, PT, DPT time 2:59 PM Sudie Grumbling 09/01/20    Assessment/Plan: Diagnosis: GBS 10. Does the need for close, 24 hr/day Medical supervision in concert with the patient's rehab needs make it unreasonable for this patient to be served in a less intensive setting? Yes  11. 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 Due to bladder management, bowel management, safety, skin/wound care, disease management, medication administration, pain management and patient education, does the patient require 24 hr/day rehab nursing? Yes 13. Does the patient require coordinated care of a physician, rehab nurse, PT, OT, and SLP to address physical and functional deficits in the context of the above medical diagnosis(es)? Yes Addressing deficits in the following areas: balance, endurance, locomotion, strength, transferring, bowel/bladder control, bathing, dressing, feeding, grooming, toileting and psychosocial support 14. Can the patient actively participate in an intensive therapy program of at least 3 hrs of therapy 5 days a week? Yes 15. The potential for patient to make measurable gains while on inpatient rehab is excellent 16. Anticipated functional outcomes upon discharge from inpatient rehab: modified independent PT, modified independent OT, n/a SLP 17. Estimated rehab length of stay to reach the above functional goals is: 7-10 days 18. Anticipated discharge destination: Home 10. Overall Rehab/Functional Prognosis: excellent   MD Signature: Meredith Staggers,  MD, [facility] 09/01/2020           Revision History                        Note Details  Author Meredith Staggers, MD File Time 09/01/2020 3:15 PM  Author Type Physician Status Signed  Last Editor Meredith Staggers, MD Service Physical Medicine and Rehabilitation

## 2020-09-01 NOTE — Plan of Care (Signed)
Completed 4 rounds of plasma exchange. Supposed to get fifth round on 08/31/2020 but somehow that did not happen. Checked with dialysis-they confirm that he is only gotten 4 hours. 51 plan for today. After the fifth round of plasma exchange, his plasma exchange catheter can be discontinued by IR. He should be okay for transfer to rehabilitation floor after the line is taken out. Plan communicated to the primary hospitalist  -- Milon Dikes, MD Neurologist [facility] Pager: [phone]

## 2020-09-01 NOTE — Plan of Care (Signed)
  Problem: Consults Goal: RH GENERAL PATIENT EDUCATION Description: See Patient Education module for education specifics. Outcome: Progressing   Problem: RH PAIN MANAGEMENT Goal: RH STG PAIN MANAGED AT OR BELOW PT'S PAIN GOAL Description: Less than 3 out of 10 Outcome: Progressing   Problem: RH KNOWLEDGE DEFICIT GENERAL Goal: RH STG INCREASE KNOWLEDGE OF SELF CARE AFTER HOSPITALIZATION Outcome: Progressing

## 2020-09-01 NOTE — Progress Notes (Signed)
Inpatient Rehab Admissions Coordinator:   I have a bed for pt to admit to CIR today.  Dr. Jacqulyn Bath in agreement.  Will let pt/family and TOC team know.    Estill Dooms, PT, DPT Admissions Coordinator [phone] 09/01/20  2:30 PM

## 2020-09-01 NOTE — Progress Notes (Signed)
Physical Therapy Treatment Patient Details Name: Jacob Cameron MRN: [id number] DOB: 1956/03/12 Today's Date: 09/01/2020    History of Present Illness Pt is a 65 y.o. male presenting with R neck pain and radiculopathy down his R arm with bil leg pain, parasthesias, and weakness. Symptoms began in legs then arm then neck. MRI showing no acute change in head. Symptoms/exam consistent with Jacob Cameron however unable to confirm diagnosis due to lack of CSF and PNCV/EMG.    PT Comments    Patient progressing well towards PT goals. Reports less tingling in BLEs today. Improved ambulation distance today with use of RW and Min A for support. Pt continues to demonstrate ataxic like, uncontrolled initial contact of BLEs with increased step length on right. Less knee hyperextension thrust noted today. Continues to require Min A to power to standing with cues for hand placement. Highly motivated and continues to be a good CIR candidate. Plan for last round of plasma exchange today and then possible rehab? Will follow.   Follow Up Recommendations  CIR;Supervision for mobility/OOB     Equipment Recommendations  Rolling walker with 5" wheels;3in1 (PT);Wheelchair (measurements PT);Wheelchair cushion (measurements PT)    Recommendations for Other Services       Precautions / Restrictions Precautions Precautions: Fall Precaution Comments: Translator helpful but pt speaks pretty good English Restrictions Weight Bearing Restrictions: No    Mobility  Bed Mobility Overal bed mobility: Needs Assistance Bed Mobility: Supine to Sit     Supine to sit: Supervision;HOB elevated     General bed mobility comments: pt utilizes rails to get to edge of bed    Transfers Overall transfer level: Needs assistance Equipment used: Rolling walker (2 wheeled) Transfers: Sit to/from Stand Sit to Stand: Min assist;Mod assist         General transfer comment: pt requires assistance to power up into standing  from EOB x2, from chair x2. Cues for hand placement and technique.  Ambulation/Gait Ambulation/Gait assistance: Min assist Gait Distance (Feet): 40 Feet (+ 60') Assistive device: Rolling walker (2 wheeled) Gait Pattern/deviations: Step-through pattern;Decreased step length - left;Narrow base of support;Trunk flexed Gait velocity: reduced Gait velocity interpretation: <1.31 ft/sec, indicative of household ambulator General Gait Details: pt with slowed step-through gait with uncontrolled inital contact bilaterally with increased step length RLE; less knee hyperextension noted today, impaired medial-lateral pelvic control   Stairs             Wheelchair Mobility    Modified Rankin (Stroke Patients Only) Modified Rankin (Stroke Patients Only) Pre-Morbid Rankin Score: No symptoms Modified Rankin: Moderately severe disability     Balance Overall balance assessment: Needs assistance Sitting-balance support: Feet supported;No upper extremity supported Sitting balance-Leahy Scale: Good Sitting balance - Comments: Supervision for safety   Standing balance support: During functional activity Standing balance-Leahy Scale: Poor Standing balance comment: reliant on BUE support of RW; stood for ~3 minutes to adjust pants.                            Cognition Arousal/Alertness: Awake/alert Behavior During Therapy: WFL for tasks assessed/performed Overall Cognitive Status: Within Functional Limits for tasks assessed                                 General Comments: Very motivated despite pain and fatigue      Exercises      General Comments General comments (  skin integrity, edema, etc.): VSS on RA.      Pertinent Vitals/Pain Pain Assessment: No/denies pain    Home Living                      Prior Function            PT Goals (current goals can now be found in the care plan section) Progress towards PT goals: Progressing toward  goals    Frequency    Min 3X/week      PT Plan Current plan remains appropriate    Co-evaluation              AM-PAC PT "6 Clicks" Mobility   Outcome Measure  Help needed turning from your back to your side while in a flat bed without using bedrails?: A Little Help needed moving from lying on your back to sitting on the side of a flat bed without using bedrails?: A Little Help needed moving to and from a bed to a chair (including a wheelchair)?: A Little Help needed standing up from a chair using your arms (e.g., wheelchair or bedside chair)?: A Little Help needed to walk in hospital room?: A Little Help needed climbing 3-5 steps with a railing? : A Lot 6 Click Score: 17    End of Session Equipment Utilized During Treatment: Gait belt Activity Tolerance: Patient tolerated treatment well Patient left: in chair;with call bell/phone within reach;with chair alarm set Nurse Communication: Mobility status PT Visit Diagnosis: Unsteadiness on feet (R26.81);Other abnormalities of gait and mobility (R26.89);Muscle weakness (generalized) (M62.81);Difficulty in walking, not elsewhere classified (R26.2);Other symptoms and signs involving the nervous system (R29.898)     Time: 1030-1049 PT Time Calculation (min) (ACUTE ONLY): 19 min  Charges:  $Gait Training: 8-22 mins                     Vale Haven, PT, DPT Acute Rehabilitation Services Pager [phone] Office [phone]       Blake Divine A Lanier Ensign 09/01/2020, 12:55 PM

## 2020-09-01 NOTE — H&P (Signed)
Physical Medicine and Rehabilitation Admission H&P    Chief Complaint  Patient presents with  . 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 Patient initially refused MRI but was agreeable to have it with sedation. Marland Kitchen MRI brain negative. MRI spine was negative for injury and showed mild spondylosis of cervical, thoracic and lumbar spine. LP recommended for work-up and patient refused this.  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 Low vitamin B 12 levels noted and was started on supplement--1000 mg qd X 7 followed by weekly X 4 and then monthly.   He continued to have issues with pain, nausea and progressive symptoms therefore plasmapheresis recommended and patient/family agreeable therefore dialysis catheter placed on 02/09 by Dr. Mauri Reading and completed 3 doses with minimal improvement and high dose IV steroids added 02/17 due to suspicion of GBS variant with 2 additional doses of PLEX-->last today.  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 Noted to have new onset diabetes with Hgb A1c-7 --doubt cause of neuropathy. He has had decrease in symptoms with improvement in strength but continues to be limtied by weakness with balance deficits. CIR recommended due to functional decline.     Review of Systems  Constitutional: Negative for chills and fever.  HENT: Negative for hearing loss.        Reports dental pain  Eyes: Negative for pain.  Respiratory: Negative for cough.   Cardiovascular: Negative for chest pain and leg swelling.   Gastrointestinal: Negative for abdominal pain, constipation and heartburn.  Genitourinary: Negative for dysuria and urgency.  Musculoskeletal: Negative for joint pain and myalgias.  Skin: Negative for rash.  Neurological: Positive for sensory change and weakness. Negative for dizziness and headaches.  Psychiatric/Behavioral: The patient is not nervous/anxious.       History reviewed. No pertinent past medical history.  --has been in [place] since 90's and has not had any basic medical care.     Past Surgical History:  Procedure Laterality Date  . IR FLUORO GUIDE CV LINE RIGHT  08/19/2020  . IR US GUIDE VASC ACCESS RIGHT  08/19/2020    Family History:  Mother alive and well. Denies any chronic illness in sibling or parents.     Social History:  Married. Retired. Lives with family. He reports that he has been smoking cigarettes--1-2 per day. He has a 2.00 pack-year smoking history. He has never used smokeless tobacco. He reports that he does not drink alcohol and does not use drugs.    Allergies: No Known Allergies    Medications Prior to Admission  Medication Sig Dispense Refill  . naproxen (NAPROSYN) 375 MG tablet Take 1 tablet twice daily as needed for muscle pain. (Patient not taking: Reported on 08/13/2020) 20 tablet 0    Drug Regimen Review  Drug regimen was reviewed and remains appropriate with no significant issues identified  Home: Home Living Family/patient expects to be discharged to:: Private residence Living Arrangements: Spouse/significant other,Children (30 y.o. or older sons) Available Help at Discharge: Family,Available 24 hours/day Type of Home: House Home Access: Level entry Home Layout: One level Bathroom Shower/Tub: Tub/shower unit  Bathroom Toilet: Standard Home Equipment: None   Functional History: Prior Function Level of Independence: Independent Comments: Pt does not drive, gets rides from son. Retired.  Functional Status:  Mobility: Bed  Mobility Overal bed mobility: Needs Assistance Bed Mobility: Supine to Sit Supine to sit: Supervision,HOB elevated Sit to supine: Supervision General bed mobility comments: pt utilizes rails to get to edge of bed Transfers Overall transfer level: Needs assistance Equipment used: Rolling walker (2 wheeled) Transfers: Sit to/from Stand Sit to Stand: Min assist,Mod assist Stand pivot transfers: Min assist General transfer comment: pt requires assistance to power up into standing. PT provides cues for use of hands to descend to chair Ambulation/Gait Ambulation/Gait assistance: Min assist Gait Distance (Feet): 40 Feet (additional trial of 30') Assistive device: Rolling walker (2 wheeled) Gait Pattern/deviations: Step-through pattern General Gait Details: pt with slowed step-through gait, pt with intermittent knee hyperextension bilaterally, impaired medial-lateral pelvic control Gait velocity: reduced Gait velocity interpretation: <1.31 ft/sec, indicative of household ambulator    ADL: ADL Overall ADL's : Needs assistance/impaired Eating/Feeding: Set up,Sitting Eating/Feeding Details (indicate cue type and reason): provided pt with built up handles Grooming: Oral care,Sitting,Minimal assistance Grooming Details (indicate cue type and reason): built up handle utilized. Using BUE to switch position of toothbrush due to poor in hand manipulation. Requiring Min A for initating opening tooth paste due to pinch weakness. Upper Body Bathing: Minimal assistance,Sitting Lower Body Bathing: Maximal assistance,Sit to/from stand Upper Body Dressing : Minimal assistance,Sitting Lower Body Dressing: Sit to/from stand,Moderate assistance Lower Body Dressing Details (indicate cue type and reason): Mod A for managing socks and position hands. Poor grasp and frequently slipping away from sock Toilet Transfer: Minimal assistance,Stand-pivot,RW (simulated to recliner) Toilet Transfer Details (indicate cue  type and reason): Min A for power up and maintaining balance in standing Toileting- Clothing Manipulation and Hygiene: Moderate assistance,+2 for physical assistance,+2 for safety/equipment Toileting - Clothing Manipulation Details (indicate cue type and reason): modA+2 for stability while standing as pt completed pericare Functional mobility during ADLs: Minimal assistance,Rolling walker General ADL Comments: Pt continues to present with decreased grasp strength, coorindation, balance, and acitviyt tolerance. Despite fatigue, pt very motivated to partipate in therapy  Cognition: Cognition Overall Cognitive Status: Within Functional Limits for tasks assessed Orientation Level: Oriented X4 Cognition Arousal/Alertness: Awake/alert Behavior During Therapy: WFL for tasks assessed/performed Overall Cognitive Status: Within Functional Limits for tasks assessed General Comments: Very motivated despite pain and fatigue  Physical Exam: Blood pressure 92/61, pulse 79, temperature 97.7 F (36.5 C), temperature source Oral, resp. rate 18, height 5\' 7"  (1.702 m), weight 74 kg, SpO2 95 %. Physical Exam Vitals and nursing note reviewed.  Constitutional:      Appearance: Normal appearance.  HENT:     Head: Normocephalic and atraumatic.     Right Ear: External ear normal.     Left Ear: External ear normal.     Mouth/Throat:     Mouth: Mucous membranes are moist.     Dentition: Dental tenderness present.     Comments: Multiple missing teeth--residual with gum recession.  Eyes:     Extraocular Movements: Extraocular movements intact.     Pupils: Pupils are equal, round, and reactive to light.  Cardiovascular:     Rate and Rhythm: Normal rate and regular rhythm.     Heart sounds: No murmur heard. No gallop.   Pulmonary:     Effort: Pulmonary effort is normal. No respiratory distress.     Breath sounds: No wheezing.  Abdominal:  General: Bowel sounds are normal. There is no distension.      Palpations: Abdomen is soft.     Hernia: A hernia (soft, reducible umbilical hernia) is present.  Skin:    General: Skin is warm and dry.  Neurological:     Mental Status: He is alert and oriented to person, place, and time.     Sensory: Sensory deficit present.     Comments: Alert and oriented x 3. Normal insight and awareness. Intact Memory. Normal language and speech. Cranial nerve exam unremarkable. Sensory loss in upper extremities up to deltoid area and in lower area to proximal thigh. Is able to sense gross touch but has difficulty LT and proprioception. DTR's absent.   Psychiatric:        Mood and Affect: Mood normal.        Behavior: Behavior normal.        Thought Content: Thought content normal.        Judgment: Judgment normal.     Results for orders placed or performed during the hospital encounter of 08/12/20 (from the past 48 hour(s))  Glucose, capillary     Status: Abnormal   Collection Time: 08/30/20 12:06 PM  Result Value Ref Range   Glucose-Capillary 137 (H) 70 - 99 mg/dL    Comment: Glucose reference range applies only to samples taken after fasting for at least 8 hours.  Glucose, capillary     Status: Abnormal   Collection Time: 08/30/20  5:06 PM  Result Value Ref Range   Glucose-Capillary 160 (H) 70 - 99 mg/dL    Comment: Glucose reference range applies only to samples taken after fasting for at least 8 hours.  Glucose, capillary     Status: Abnormal   Collection Time: 08/30/20  9:03 PM  Result Value Ref Range   Glucose-Capillary 178 (H) 70 - 99 mg/dL    Comment: Glucose reference range applies only to samples taken after fasting for at least 8 hours.  Glucose, capillary     Status: Abnormal   Collection Time: 08/31/20  7:12 AM  Result Value Ref Range   Glucose-Capillary 174 (H) 70 - 99 mg/dL    Comment: Glucose reference range applies only to samples taken after fasting for at least 8 hours.   Comment 1 Notify RN    Comment 2 Document in Chart   Glucose,  capillary     Status: Abnormal   Collection Time: 08/31/20 12:04 PM  Result Value Ref Range   Glucose-Capillary 235 (H) 70 - 99 mg/dL    Comment: Glucose reference range applies only to samples taken after fasting for at least 8 hours.  Glucose, capillary     Status: Abnormal   Collection Time: 08/31/20  6:06 PM  Result Value Ref Range   Glucose-Capillary 163 (H) 70 - 99 mg/dL    Comment: Glucose reference range applies only to samples taken after fasting for at least 8 hours.  Glucose, capillary     Status: Abnormal   Collection Time: 08/31/20  9:16 PM  Result Value Ref Range   Glucose-Capillary 177 (H) 70 - 99 mg/dL    Comment: Glucose reference range applies only to samples taken after fasting for at least 8 hours.   Comment 1 Notify RN    Comment 2 Document in Chart   CBC     Status: Abnormal   Collection Time: 09/01/20  4:07 AM  Result Value Ref Range   WBC 18.4 (H) 4.0 - 10.5 K/uL  RBC 3.78 (L) 4.22 - 5.81 MIL/uL   Hemoglobin 10.7 (L) 13.0 - 17.0 g/dL   HCT 38.7 (L) 56.4 - 33.2 %   MCV 82.8 80.0 - 100.0 fL   MCH 28.3 26.0 - 34.0 pg   MCHC 34.2 30.0 - 36.0 g/dL   RDW 95.1 88.4 - 16.6 %   Platelets 373 150 - 400 K/uL   nRBC 0.0 0.0 - 0.2 %    Comment: Performed at [facility], [address], [place], [place] [address]  Basic metabolic panel     Status: Abnormal   Collection Time: 09/01/20  4:07 AM  Result Value Ref Range   Sodium 134 (L) 135 - 145 mmol/L   Potassium 4.3 3.5 - 5.1 mmol/L   Chloride 100 98 - 111 mmol/L   CO2 24 22 - 32 mmol/L   Glucose, Bld 189 (H) 70 - 99 mg/dL    Comment: Glucose reference range applies only to samples taken after fasting for at least 8 hours.   BUN 29 (H) 8 - 23 mg/dL   Creatinine, Ser 6.01 0.61 - 1.24 mg/dL   Calcium 8.5 (L) 8.9 - 10.3 mg/dL   GFR, Estimated >09 >32 mL/min    Comment: (NOTE) Calculated using the CKD-EPI Creatinine Equation (2021)    Anion gap 10 5 - 15    Comment: Performed at [facility],  [address], [place], [place] [address]  Glucose, capillary     Status: Abnormal   Collection Time: 09/01/20  6:11 AM  Result Value Ref Range   Glucose-Capillary 186 (H) 70 - 99 mg/dL    Comment: Glucose reference range applies only to samples taken after fasting for at least 8 hours.   Comment 1 Notify RN    Comment 2 Document in Chart    No results found.     Medical Problem List and Plan: 1.  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  Antithrombotics: -DVT/anticoagulation:  Pharmaceutical: Lovenox  -antiplatelet therapy: N/A 3. Pain Management: Gabapentin tid effective 4. Mood: LCSW to follow for evaluation and support.   -antipsychotic agents: N/A 5. Neuropsych: This patient is capable of making decisions on his own behalf. 6. Skin/Wound Care: routine pressure relief measures.  7. Fluids/Electrolytes/Nutrition: Monitor I/O. Check lytes in am. 8. Dental pain: Ibuprofen prn effective.   --Educated on importance of oral care due to recessed gums.  --Will order peridex bid 9. T2DM-new diagnosis:  Hgb A1c-7.0--will consult dietician to educate on CM diet  --Monitor BS ac/hs with SSI for elevated BS 10. Vitamin B 12 deficiency: Being supplemented 11. Hyponatremia: Slowly improving from 129-->134 today. Continue to monitor. 12. Leucocytosis:  Likely reactive and improving off steroids.   --Monitor for fevers and other signs of infection.        Jacquelynn Cree, PA-C 09/01/2020

## 2020-09-02 DIAGNOSIS — G629 Polyneuropathy, unspecified: Secondary | ICD-10-CM | POA: Diagnosis not present

## 2020-09-02 LAB — COMPREHENSIVE METABOLIC PANEL
ALT: 17 U/L (ref 0–44)
AST: 12 U/L — ABNORMAL LOW (ref 15–41)
Albumin: 3.7 g/dL (ref 3.5–5.0)
Alkaline Phosphatase: 12 U/L — ABNORMAL LOW (ref 38–126)
Anion gap: 10 (ref 5–15)
BUN: 29 mg/dL — ABNORMAL HIGH (ref 8–23)
CO2: 22 mmol/L (ref 22–32)
Calcium: 8.2 mg/dL — ABNORMAL LOW (ref 8.9–10.3)
Chloride: 103 mmol/L (ref 98–111)
Creatinine, Ser: 1.03 mg/dL (ref 0.61–1.24)
GFR, Estimated: >60 mL/min (ref >60)
Glucose, Bld: 110 mg/dL — ABNORMAL HIGH (ref 70–99)
Potassium: 4.1 mmol/L (ref 3.5–5.1)
Sodium: 135 mmol/L (ref 135–145)
Total Bilirubin: 0.7 mg/dL (ref 0.3–1.2)
Total Protein: 4.3 g/dL — ABNORMAL LOW (ref 6.5–8.1)

## 2020-09-02 LAB — GLUCOSE, CAPILLARY
Glucose-Capillary: 115 mg/dL — ABNORMAL HIGH (ref 70–99)
Glucose-Capillary: 163 mg/dL — ABNORMAL HIGH (ref 70–99)
Glucose-Capillary: 100 mg/dL — ABNORMAL HIGH (ref 70–99)
Glucose-Capillary: 120 mg/dL — ABNORMAL HIGH (ref 70–99)

## 2020-09-02 LAB — CBC WITH DIFFERENTIAL/PLATELET
Abs Immature Granulocytes: 0.34 10*3/uL — ABNORMAL HIGH (ref 0.00–0.07)
Basophils Absolute: 0 10*3/uL (ref 0.0–0.1)
Basophils Relative: 0 %
Eosinophils Absolute: 0 10*3/uL (ref 0.0–0.5)
Eosinophils Relative: 0 %
HCT: 34.2 % — ABNORMAL LOW (ref 39.0–52.0)
Hemoglobin: 10.8 g/dL — ABNORMAL LOW (ref 13.0–17.0)
Immature Granulocytes: 2 %
Lymphocytes Relative: 10 %
Lymphs Abs: 1.5 10*3/uL (ref 0.7–4.0)
MCH: 27.8 pg (ref 26.0–34.0)
MCHC: 31.6 g/dL (ref 30.0–36.0)
MCV: 87.9 fL (ref 80.0–100.0)
Monocytes Absolute: 1 10*3/uL (ref 0.1–1.0)
Monocytes Relative: 6 %
Neutro Abs: 12.2 10*3/uL — ABNORMAL HIGH (ref 1.7–7.7)
Neutrophils Relative %: 82 %
Platelets: 363 10*3/uL (ref 150–400)
RBC: 3.89 MIL/uL — ABNORMAL LOW (ref 4.22–5.81)
RDW: 13.7 % (ref 11.5–15.5)
WBC: 15.1 10*3/uL — ABNORMAL HIGH (ref 4.0–10.5)
nRBC: 0 % (ref 0.0–0.2)

## 2020-09-02 MED ORDER — GABAPENTIN 300 MG PO CAPS
300.0000 mg | ORAL_CAPSULE | Freq: Three times a day (TID) | ORAL | Status: DC
  Administered 2020-09-02 – 2020-09-05 (×9): 300 mg via ORAL
  Filled 2020-09-02 (×9): qty 1

## 2020-09-02 MED ORDER — CHLORHEXIDINE GLUCONATE CLOTH 2 % EX PADS
6.0000 | MEDICATED_PAD | Freq: Every day | CUTANEOUS | Status: DC
  Administered 2020-09-03 – 2020-09-07 (×5): 6 via TOPICAL

## 2020-09-02 NOTE — Progress Notes (Signed)
Patient ID: Jacob Cameron, male   DOB: 11-Mar-1956, 65 y.o.   MRN: [id number] Team Conference Report to Patient/Family  Team Conference discussion was reviewed with the patient and caregiver, including goals, any changes in plan of care and target discharge date.  Patient and caregiver express understanding and are in agreement.  The patient has a target discharge date of  (ELOS week; evals pending).  Andria Rhein 09/02/2020, 1:11 PM

## 2020-09-02 NOTE — Evaluation (Signed)
Physical Therapy Assessment and Plan  Patient Details  Name: Jacob Cameron MRN: [id number] Date of Birth: 1955/09/06  PT Diagnosis: Abnormality of gait, Ataxia, Ataxic gait, Coordination disorder, Difficulty walking, Impaired sensation, Muscle weakness and Pain in B UE and B LE Rehab Potential: Good ELOS: 10-14 days   Today's Date: 09/02/2020 PT Individual Time: 3016-0109 PT Individual Time Calculation (min): 40 min  and  Today's Date: 09/02/2020 PT Missed Time: 20 Minutes Missed Time Reason: Pain   Hospital Problem: Principal Problem:   Axonal polyneuropathy   Past Medical History: History reviewed. No pertinent past medical history. Past Surgical History:  Past Surgical History:  Procedure Laterality Date  . IR FLUORO GUIDE CV LINE RIGHT  08/19/2020  . IR US GUIDE VASC ACCESS RIGHT  08/19/2020    Assessment & Plan Clinical Impression: Patient is a 65 y.o. year old male in relatively good health who was admitted on 08/12/2020 with bilateral lower extremity and right upper extremity paresthesias with shooting painswith BLE weakness. Patient initially refused MRI but was agreeable to have it with sedation.Marland KitchenMRI brain negative. MRI spine was negative for injury and showed mild spondylosis of cervical, thoracic and lumbar spine.LP recommended for work-up and patient refused this. 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   He continued to have issues with pain, nausea and progressive symptoms therefore plasmapheresis recommended and patient/family agreeable therefore dialysis catheter placed on 02/09 by Dr. Sharen Heck and completed 3 doses with minimal improvement and high dose IV steroids added 02/17 due to  suspicion of GBS variant with 2 additional doses of PLEX-->last today. 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 Noted to have new onset diabetes with Hgb A1c-7 --doubt cause of neuropathy. He has had decrease in symptoms with improvement in strength but continues to be limtied by weakness with balance deficits. CIR recommended due to functional decline. Patient transferred to CIR on 09/01/2020 .   Patient currently requires mod assist with mobility secondary to muscle weakness, decreased cardiorespiratoy endurance, impaired timing and sequencing, unbalanced muscle activation, ataxia and decreased coordination,   and decreased standing balance, decreased postural control and decreased balance strategies.  Prior to hospitalization, patient was independent  with mobility and lived with Spouse,Son in a House home.  Home access is  Level entry.  Patient will benefit from skilled PT intervention to maximize safe functional mobility, minimize fall risk and decrease caregiver burden for planned discharge home with 24 hour supervision.  Anticipate patient will benefit from follow up OP at discharge.  PT - End of Session Activity Tolerance: Tolerates 30+ min activity with multiple rests Endurance Deficit: Yes Endurance Deficit Description: patient refused to get OOB due to fatigue and bilateral lower leg pain/sensory impairment PT Assessment Rehab Potential (ACUTE/IP ONLY): Good PT Barriers to Discharge: Behavior PT Patient demonstrates impairments in the following area(s): Balance;Perception;Behavior;Safety;Edema;Sensory;Endurance;Skin Integrity;Motor;Nutrition;Pain PT Transfers Functional Problem(s): Bed Mobility;Bed to Chair;Car;Furniture PT Locomotion Functional Problem(s): Ambulation;Stairs;Wheelchair Mobility PT Plan PT Intensity: Minimum of 1-2 x/day ,45 to 90 minutes PT Frequency: 5 out of  7 days PT Duration Estimated Length of Stay: 10-14 days PT Treatment/Interventions: Ambulation/gait training;Community reintegration;DME/adaptive equipment instruction;Neuromuscular re-education;Psychosocial support;Stair training;UE/LE Strength taining/ROM;Training and development officer;Wheelchair propulsion/positioning;Discharge planning;Functional electrical stimulation;Pain  management;Skin care/wound management;Therapeutic Activities;UE/LE Coordination activities;Cognitive remediation/compensation;Disease management/prevention;Patient/family education;Splinting/orthotics;Functional mobility training;Therapeutic Exercise;Visual/perceptual remediation/compensation PT Transfers Anticipated Outcome(s): supervision using LRAD PT Locomotion Anticipated Outcome(s): CGA using LRAD PT Recommendation Follow Up Recommendations: Outpatient PT;24 hour supervision/assistance Patient destination: Home Equipment Recommended: To be determined   PT Evaluation Precautions/Restrictions Precautions Precautions: Fall Restrictions Weight Bearing Restrictions: No Pain Pain Assessment Pain Scale: Faces Pain Score: 9  Faces Pain Scale: Hurts whole lot (continues just to state "too much pain" but unrated) Pain Type: Acute pain Pain Location: Leg Pain Orientation: Left;Right Pain Descriptors / Indicators: Tingling Pain Onset: Other (Comment) (pt states that his pain had stopped but started again last night) Pain Intervention(s): Medication (See eMAR);RN made aware;Rest;Repositioned;Environmental changes;Emotional support Multiple Pain Sites: Yes 2nd Pain Site Wong-Baker Pain Rating: Hurts whole lot Pain Type: Acute pain Pain Location: Arm Pain Orientation: Right;Left Pain Descriptors / Indicators: Tingling Pain Onset: Other (Comment) (see above) Pain Intervention(s): Medication (See eMAR);RN made aware Home Living/Prior Functioning Home Living Available Help at Discharge: Family;Available 24 hours/day  (son works part time but wife does not work) Type of Home: House Home Access: Level entry [place]: One level  Lives With: Spouse;Son (wife and 14y.o. son) Prior Function Level of Independence: Independent with gait;Independent with transfers;Independent with homemaking with ambulation  Able to Take Stairs?: Yes Driving: No Vocation: Retired Comments: Pt does not drive, gets rides from son. Retired Animal nutritionist at [employer]: Within [place]: Intact  Cognition Overall Cognitive Status: Within Functional Limits for tasks assessed Arousal/Alertness: Awake/alert Orientation Level: Oriented to person;Oriented to place;Oriented to time;Disoriented to situation ("hospital" in "[place]" only able to state that he is "sick" but unable to provide name of diagnosis) Attention: Focused Focused Attention: Appears intact Awareness: Appears intact Safety/Judgment: Appears intact Sensation Sensation Light Touch: Impaired Detail Light Touch Impaired Details: Impaired RLE;Impaired LLE Hot/Cold: Not tested Proprioception: Impaired Detail Proprioception Impaired Details: Impaired RLE;Impaired LLE Stereognosis: Not tested Coordination Gross Motor Movements are Fluid and Coordinated: No Coordination and Movement Description: impaired due to generalized weakness Motor  Motor Motor: Other (comment);Ataxia Motor - Skilled Clinical Observations: generalized weakness and deconditioning   Trunk/Postural Assessment  Cervical Assessment Cervical Assessment: Within Functional Limits Thoracic Assessment Thoracic Assessment: Exceptions to [facility] (thoracic rounding) Lumbar Assessment Lumbar Assessment: Exceptions to [facility] (posterior pelvic tilt in sitting)  Balance Balance Balance Assessed: Yes Static Sitting Balance Static Sitting - Balance Support: Right upper extremity supported Static Sitting - Level of Assistance: 5: Stand by  assistance Extremity Assessment      RLE Assessment RLE Assessment: Exceptions to [facility] Active Range of Motion (AROM) Comments: WFL General Strength Comments: able to move against gravity with some difficulty noted understanding directions to hold position while therapist provided resistance RLE Strength Right Hip Flexion: 3/5 Right Knee Flexion: 3/5 Right Knee Extension: 3/5 Right Ankle Dorsiflexion: 3/5 Right Ankle Plantar Flexion: 3/5 LLE Assessment LLE Assessment: Exceptions to [facility] Active Range of Motion (AROM) Comments: WFL General Strength Comments: able to move against gravity with some difficulty noted understanding directions to hold position while therapist provided resistance LLE Strength Left Hip Flexion: 3/5 Left Knee Flexion: 3/5 Left Knee Extension: 3/5 Left Ankle Dorsiflexion: 3/5 Left Ankle Plantar Flexion: 3/5  Care Tool Care Tool Bed Mobility Roll left and right activity   Roll left and right assist level: Supervision/Verbal cueing    Sit to lying activity   Sit to lying assist level: Supervision/Verbal cueing    Lying to sitting edge  of bed activity   Lying to sitting edge of bed assist level: Supervision/Verbal cueing     Care Tool Transfers Sit to stand transfer Sit to stand activity did not occur: Refused      Chair/bed transfer Chair/bed transfer activity did not occur: Copywriter, advertising transfer activity did not occur: Refused        Care Tool Locomotion Ambulation Ambulation activity did not occur: Refused        Walk 10 feet activity Walk 10 feet activity did not occur: Refused       Walk 50 feet with 2 turns activity Walk 50 feet with 2 turns activity did not occur: Refused      Walk 150 feet activity Walk 150 feet activity did not occur: Safety/medical concerns      Walk 10 feet on uneven surfaces activity Walk 10 feet on uneven surfaces activity did not occur: Safety/medical concerns       Stairs Stair activity did not occur: Refused        Walk up/down 1 step activity Walk up/down 1 step or curb (drop down) activity did not occur: Refused     Walk up/down 4 steps activity did not occuR: Refused  Walk up/down 4 steps activity      Walk up/down 12 steps activity Walk up/down 12 steps activity did not occur: Safety/medical concerns      Pick up small objects from floor Pick up small object from the floor (from standing position) activity did not occur: Refused      Wheelchair Will patient use wheelchair at discharge?:  (TBD)          Wheel 50 feet with 2 turns activity      Wheel 150 feet activity        Refer to Care Plan for Long Term Goals  SHORT TERM GOAL WEEK 1 PT Short Term Goal 1 (Week 1): Pt will consistently perform sit<>stands using LRAD with min assist PT Short Term Goal 2 (Week 1): Pt will consistently perform bed<>chair transfers using LRAD with min assist PT Short Term Goal 3 (Week 1): Pt will ambulate at least 55f using LRAD with min assist  Recommendations for other services: None   Skilled Therapeutic Intervention Pt received supine in bed and initially agreeable to therapy session. Pt reporting that his pain was under control in acute care but that it started again last night and he is having "tingling" pain in B LEs and feet along with B UEs and hands - pt repeatedly stating "too much pain." RN notified and present for medication administration. Evaluation completed (see details above) with patient education regarding purpose of PT evaluation, PT POC and goals, therapy schedule, weekly team meetings, and other CIR information including safety plan and fall risk safety. Upon encouraging pt to transition to EOB he said "I can't...too much pain" but then initiates transition and comes to partial sitting EOB leaning trunk over onto forearm support. Pt requesting to wash his hands but despite encouragement to transfer to w/c and wash hands at sink  pt requested a wet wash cloth in the bed. Pt then requests to put lotion onto lower legs - therapist again encouraged him to complete this task but he just stated "I can't...too much pain." Pt noted not to have eaten breakfast and requests to order new meal tray - therapist assisted with this. Pt repeatedly states "I  can't do anything today" with therapist providing encouragement and education about importance of therapy to increase independence with functional mobility along with CIR therapy schedule with pt verbalizing understanding but replying "not today...once the doctor gets my pain under control then I can do something." Pt left supine in bed with needs in reach and bed alarm on. Missed 20 minutes of skilled physical therapy.  Mobility Bed Mobility Bed Mobility: Supine to Sit;Sit to Supine Supine to Sit: Supervision/Verbal cueing Sit to Supine: Supervision/Verbal cueing Locomotion  Gait Ambulation: No (pt declined OOB mobility) Gait Gait: No Stairs / Additional Locomotion Stairs: No Wheelchair Mobility Wheelchair Mobility: No   Discharge Criteria: Patient will be discharged from PT if patient refuses treatment 3 consecutive times without medical reason, if treatment goals not met, if there is a change in medical status, if patient makes no progress towards goals or if patient is discharged from hospital.  The above assessment, treatment plan, treatment alternatives and goals were discussed and mutually agreed upon: by patient  Tawana Scale , PT, DPT, CSRS  09/02/2020, 7:47 AM

## 2020-09-02 NOTE — Patient Care Conference (Signed)
Inpatient RehabilitationTeam Conference and Plan of Care Update Date: 09/02/2020   Time: 10:51 AM    Patient Name: Jacob Cameron      Medical Record Number: [id number]  Date of Birth: 1956-03-10 Sex: Male         Room/Bed: 4W15C/4W15C-01 Payor Info: Payor: MEDICARE / Plan: MEDICARE PART A AND B / Product Type: *No Product type* /    Admit Date/Time:  09/01/2020  4:08 PM  Primary Diagnosis:  Axonal polyneuropathy  Hospital Problems: Principal Problem:   Axonal polyneuropathy    Expected Discharge Date: Expected Discharge Date:  (ELOS week; evals pending)  Team Members Present: Physician leading conference: Dr. Claudette Laws Care Coodinator Present: Chana Bode, RN, BSN, CRRN;Christina Conover, BSW Nurse Present: Other (comment) ([place], Kerman Passey, RN) PT Present: Casimiro Needle, PT OT Present: Jake Shark, OT PPS Coordinator present : Fae Pippin, SLP     Current Status/Progress Goal Weekly Team Focus  Bowel/Bladder   Pt continent B/B LBM per pt 2/21  Remain continent B/B  offering toileting assitance as needed   Swallow/Nutrition/ Hydration             ADL's   eval pending         Mobility   eval pending         Communication             Safety/Cognition/ Behavioral Observations            Pain   6-10 out of 10- Patient did reports relief with PRN oxy and scheduled gabapentin given  less than 3 out of 10  Assess q shift and treat PRN   Skin   Mild brusing to RUE  No skin breakdown this admission  Assess skin q shift     Discharge Planning:      Team Discussion: 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 Completed treatment for diagnosis and abx regimen completed. Fluid restrictions tolerated well.   Patient on target to meet rehab goals: Patient with anxiety and refusing therapy until concerns cleared with MD. MD adjusted medications and addressed concerns.  *See Care Plan and progress notes for long  and short-term goals.   Revisions to Treatment Plan:   Teaching Needs: Transfers, toileting, medications, etc.   Current Barriers to Discharge: Supportive family; none noted  Possible Resolutions to Barriers:  Family education    Medical Summary Current Status: c/o severe numbness and tingling pain  Barriers to Discharge: Medical stability;Other (comments)  Barriers to Discharge Comments: neuropathic pain Possible Resolutions to Barriers/Weekly Focus: adjust gabapentin for neuropathic pain to increase therapy tolerance   Continued Need for Acute Rehabilitation Level of Care: The patient requires daily medical management by a physician with specialized training in physical medicine and rehabilitation for the following reasons: Direction of a multidisciplinary physical rehabilitation program to maximize functional independence : Yes Medical management of patient stability for increased activity during participation in an intensive rehabilitation regime.: Yes Analysis of laboratory values and/or radiology reports with any subsequent need for medication adjustment and/or medical intervention. : Yes   I attest that I was present, lead the team conference, and concur with the assessment and plan of the team.   Chana Bode B 09/02/2020, 3:10 PM

## 2020-09-02 NOTE — Evaluation (Signed)
Occupational Therapy Assessment and Plan  Patient Details  Name: Jacob Cameron MRN: [id number] Date of Birth: 05/18/1956  OT Diagnosis: acute pain, muscle weakness (generalized) and coordination impairment Rehab Potential:   ELOS: 10-14 days   Today's Date: 09/02/2020 OT Individual Time: 1000-1045 OT Individual Time Calculation (min): 45 min     Hospital Problem: Principal Problem:   Axonal polyneuropathy   Past Medical History: History reviewed. No pertinent past medical history. Past Surgical History:  Past Surgical History:  Procedure Laterality Date  . IR FLUORO GUIDE CV LINE RIGHT  08/19/2020  . IR US GUIDE VASC ACCESS RIGHT  08/19/2020    Assessment & Plan Clinical Impression: Patient is a 64 y.o. year old male with bilateral lower extremity and right upper extremity paresthesias with shooting painswith BLE weakness. Patient initially refused MRI but was agreeable to have it with sedation..MRI brain negative. MRI spine was negative for injury and showed mild spondylosis of cervical, thoracic and lumbar spine.LP recommended for work-up and patient refused this. 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   He continued to have issues with pain, nausea and progressive symptoms therefore plasmapheresis recommended and patient/family agreeable therefore dialysis catheter placed on 02/09 by Dr. Farhaan and completed 3 doses with minimal improvement and high dose IV steroids added 02/17 due to suspicion of GBS variant with 2 additional doses of PLEX-->last today. Additional work up --Sjogren's antibody panel, urine for heavy metals, RF, ANA urine electrophoresis-->all negative SIADH treated with fluid  restriction. Noted to have new onset diabetes with Hgb A1c-7 --doubt cause of neuropathy. He has had decrease in symptoms with improvement in strength but continues to be limtied by weakness with balance deficits.  Patient transferred to CIR on 09/01/2020 .    Patient currently requires max with basic self-care skills secondary to muscle weakness, decreased cardiorespiratoy endurance, decreased coordination and decreased motor planning and decreased sitting balance, decreased standing balance and decreased postural control.  Prior to hospitalization, patient could complete ADL with independent .  Patient will benefit from skilled intervention to decrease level of assist with basic self-care skills and increase independence with basic self-care skills prior to discharge home with care partner.  Anticipate patient will require intermittent supervision and follow up home health.  OT - End of Session Activity Tolerance: Tolerates 10 - 20 min activity with multiple rests Endurance Deficit: Yes Endurance Deficit Description: patient refused to get OOB due to fatigue and bilateral lower leg pain/sensory impairment OT Assessment OT Patient demonstrates impairments in the following area(s): Endurance;Pain;Sensory;Balance OT Basic ADL's Functional Problem(s): Grooming;Bathing;Dressing;Toileting OT Transfers Functional Problem(s): Toilet OT Additional Impairment(s): Fuctional Use of Upper Extremity OT Plan OT Intensity: Minimum of 1-2 x/day, 45 to 90 minutes OT Frequency: 5 out of 7 days OT Duration/Estimated Length of Stay: 10-14 days OT Treatment/Interventions: Balance/vestibular training;Self Care/advanced ADL retraining;DME/adaptive equipment instruction;Pain management;Patient/family education;UE/LE Coordination activities;Discharge planning;Functional mobility training;Therapeutic Activities OT Self Feeding Anticipated Outcome(s): independent OT Basic Self-Care Anticipated Outcome(s): set  up/supervision OT Toileting Anticipated Outcome(s): supervision OT Bathroom Transfers Anticipated Outcome(s): supervision OT Recommendation Patient destination: Home Follow Up Recommendations: Home health OT Equipment Recommended: 3 in 1 bedside comode   OT Evaluation Precautions/Restrictions  Precautions Precautions: Fall Restrictions Weight Bearing Restrictions: No General OT Amount of Missed   Time: 15 Minutes Vital Signs Therapy Vitals Temp: 98.2 F (36.8 C) Temp Source: Oral Pulse Rate: 82 Resp: 17 BP: 99/71 Patient Position (if appropriate): Lying Oxygen Therapy SpO2: 97 % O2 Device: Room Air Pain Pain Assessment Pain Scale: 0-10 Pain Score: 7  Pain Type: Acute pain Pain Location: Leg Pain Orientation: Right;Left Pain Descriptors / Indicators: Tingling Pain Intervention(s): Repositioned Home Living/Prior Functioning Home Living Family/patient expects to be discharged to:: Private residence Living Arrangements: Children,Spouse/significant other Available Help at Discharge: Family,Available 24 hours/day Type of Home: House Home Access: Level entry Home Layout: One level Bathroom Shower/Tub: Chiropodist: Standard  Lives With: Spouse,Son Prior Function Level of Independence: Independent with gait,Independent with transfers,Independent with homemaking with ambulation,Independent with basic ADLs  Able to Take Stairs?: Yes Driving: No Vocation: Retired Comments: Pt does not drive, gets rides from son. 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 Baseline Vision/History: No visual deficits Patient Visual Report: No change from baseline Vision Assessment?: No apparent visual deficits Perception  Perception: Within Functional Limits Praxis Praxis: Intact Cognition Overall Cognitive Status: Within Functional Limits for tasks assessed Arousal/Alertness: Awake/alert Orientation Level: Person;Place;Situation Person: Oriented Place:  Oriented Situation: Oriented Year: 2022 Month: February Day of Week: Correct Memory: Appears intact Immediate Memory Recall: Sock;Blue;Bed Memory Recall Sock: Not able to recall Memory Recall Blue: Not able to recall Memory Recall Bed: Not able to recall Awareness: Appears intact Problem Solving: Impaired Problem Solving Impairment: Functional basic;Functional complex Sensation Sensation Proprioception: Appears Intact Additional Comments: bilateral hand/arm hot/cold and light touch intact - he notes distal tingling in bilateral hands Coordination Gross Motor Movements are Fluid and Coordinated: No Fine Motor Movements are Fluid and Coordinated: No Finger Nose Finger Test: moderate dysmetria right Extremity/Trunk Assessment RUE Assessment Active Range of Motion (AROM) Comments: WFL t/o General Strength Comments: 4/5 LUE Assessment Active Range of Motion (AROM) Comments: WFL t/o General Strength Comments: 4+/5  Care Tool Care Tool Self Care Eating        Oral Care    Oral Care Assist Level: Supervision/Verbal cueing    Bathing   Body parts bathed by patient: Face     Assist Level: Supervision/Verbal cueing    Upper Body Dressing(including orthotics)   What is the patient wearing?: Hospital gown only   Assist Level: Maximal Assistance - Patient 25 - 49%    Lower Body Dressing (excluding footwear) Lower body dressing activity did not occur: Refused        Putting on/Taking off footwear   What is the patient wearing?: Non-skid slipper socks Assist for footwear: Dependent - Patient 0%       Care Tool Toileting Toileting activity Toileting Activity did not occur (Clothing management and hygiene only): Refused       Care Tool Bed Mobility Roll left and right activity        Sit to lying activity        Lying to sitting edge of bed activity         Care Tool Transfers Sit to stand transfer        Chair/bed transfer         Toilet transfer          Care Tool Cognition Expression of Ideas and Wants Expression of Ideas and Wants: Without difficulty (complex and basic) - expresses complex messages without difficulty and with speech that is clear and easy to understand   Understanding Verbal and Non-Verbal Content Understanding Verbal and Non-Verbal Content: Usually understands -  understands most conversations, but misses some part/intent of message. Requires cues at times to understand   Memory/Recall Ability *first 3 days only Memory/Recall Ability *first 3 days only: That he or she is in a hospital/hospital unit    Refer to Care Plan for [place] 1 OT Short Term Goal 1 (Week 1): patient will tolerate change of position and sitting in w/c 2+ hours OT Short Term Goal 2 (Week 1): patient will complete bed mobility and functional transfers with min A OT Short Term Goal 3 (Week 1): patient will complete bathing and grooming tasks w/c level with min a OT Short Term Goal 4 (Week 1): patient will complete toileting with min A  Recommendations for other services: None    Skilled Therapeutic Intervention   Patient in bed, alert and cooperative but states that he is too tired.  Reviewed role of OT and evaluation process - he was agreeable to participate as long as he didn't have to get OOB.  See evaluation above.  Discussed goals for therapy and plan of care.  He agreed to adl training and moving at bed level.  Overall he requests help for tasks that are within reach/strength ability.  He was responsive to training plan and agrees to supervision level goals.  He remained in bed at close of session missing 15 minutes due to fatigue at this time.   Bed alarm set and call bell in reach.   ADL ADL Eating: Set up Where Assessed-Eating: Bed level Grooming: Supervision/safety;Setup Where Assessed-Grooming: Bed level Lower Body Bathing: Dependent Where Assessed-Lower Body Bathing: Bed level Upper Body Dressing:  Maximal assistance Where Assessed-Upper Body Dressing: Bed level Lower Body Dressing: Dependent Where Assessed-Lower Body Dressing: Bed level ADL Comments: patient refused lower body dressing, required max A to donn clean hospital gown, dependent to wash feet and put on clean slipper socks, refused to get OOB this session Mobility  Bed Mobility Bed Mobility:  (patient declined sitting edge of bed but able to move to long sit position in bed without difficulty)   Discharge Criteria: Patient will be discharged from OT if patient refuses treatment 3 consecutive times without medical reason, if treatment goals not met, if there is a change in medical status, if patient makes no progress towards goals or if patient is discharged from hospital.  The above assessment, treatment plan, treatment alternatives and goals were discussed and mutually agreed upon: by patient  Carlos Levering 09/02/2020, 4:09 PM

## 2020-09-02 NOTE — Progress Notes (Signed)
[facility]patient Rehabilitation Center Individual Statement of Services  Patient Name:  Jacob Cameron  Date:  09/02/2020  Welcome to the Inpatient Rehabilitation Center.  Our goal is to provide you with an individualized program based on your diagnosis and situation, designed to meet your specific needs.  With this comprehensive rehabilitation program, you will be expected to participate in at least 3 hours of rehabilitation therapies Monday-Friday, with modified therapy programming on the weekends.  Your rehabilitation program will include the following services:  Physical Therapy (PT), Occupational Therapy (OT), Speech Therapy (ST), 24 hour per day rehabilitation nursing, Therapeutic Recreaction (TR), Neuropsychology, Care Coordinator, Rehabilitation Medicine, Nutrition Services, Pharmacy Services and Other  Weekly team conferences will be held on Wednesdays to discuss your progress.  Your Inpatient Rehabilitation Care Coordinator will talk with you frequently to get your input and to update you on team discussions.  Team conferences with you and your family in attendance may also be held.  Expected length of stay: 7-10 Days  Overall anticipated outcome: MOD I  Depending on your progress and recovery, your program may change. Your Inpatient Rehabilitation Care Coordinator will coordinate services and will keep you informed of any changes. Your Inpatient Rehabilitation Care Coordinator's name and contact numbers are listed  below.  The following services may also be recommended but are not provided by the Inpatient Rehabilitation Center:    Home Health Rehabiltiation Services  Outpatient Rehabilitation Services    Arrangements will be made to provide these services after discharge if needed.  Arrangements include referral to agencies that provide these services.  Your insurance has been verified to be:  Medicare A & B Your primary doctor is:  NO PCP  Pertinent information will be shared with your  doctor and your insurance company.  Inpatient Rehabilitation Care Coordinator:  Lavera Guise, [place] [phone] or [phone]  Information discussed with and copy given to patient by: Andria Rhein, 09/02/2020, 11:50 AM

## 2020-09-02 NOTE — Progress Notes (Addendum)
PROGRESS NOTE   Subjective/Complaints:  Pt c/o tingling pain in feet> hands , no increased weakness, loose stools  No oral pain  ROS- neg CP, SOB, N/V , + diarrhea  Objective:   No results found. Recent Labs    09/01/20 0407 09/01/20 2031 09/02/20 0624  WBC 18.4*  --  15.1*  HGB 10.7* 10.2* 10.8*  HCT 31.3* 30.0* 34.2*  PLT 373  --  363   Recent Labs    09/01/20 0407 09/01/20 2031 09/02/20 0624  NA 134* 137 135  K 4.3 3.6 4.1  CL 100 101 103  CO2 24  --  22  GLUCOSE 189* 127* 110*  BUN 29* 28* 29*  CREATININE 0.96 0.80 1.03  CALCIUM 8.5*  --  8.2*    Intake/Output Summary (Last 24 hours) at 09/02/2020 0916 Last data filed at 09/02/2020 0600 Gross per 24 hour  Intake 480 ml  Output 400 ml  Net 80 ml        Physical Exam: Vital Signs Blood pressure 99/66, pulse 77, temperature 98.5 F (36.9 C), resp. rate 16, height 5\' 8"  (1.727 m), weight 76.8 kg, SpO2 98 %.   General: No acute distress Mood and affect are appropriate Heart: Regular rate and rhythm no rubs murmurs or extra sounds Lungs: Clear to auscultation, breathing unlabored, no rales or wheezes Abdomen: Positive bowel sounds, soft nontender to palpation, unbilical hernai non tender Extremities: No clubbing, cyanosis, or edema Skin: No evidence of breakdown, no evidence of rash Neurologic: Cranial nerves II through XII intact, motor strength is 5/5 in bilateral deltoid, bicep, tricep, grip, hip flexor, knee extensors, ankle dorsiflexor and plantar flexor Sensory exam normal sensation to light touch and proprioception in bilateral upper and lower extremities Cerebellar exam normal finger to nose to finger as well as heel to shin in bilateral upper and lower extremities Musculoskeletal: Full range of motion in all 4 extremities. No joint swelling   Assessment/Plan: 1. Functional deficits which require 3+ hours per day of interdisciplinary  therapy in a comprehensive inpatient rehab setting.  Physiatrist is providing close team supervision and 24 hour management of active medical problems listed below.  Physiatrist and rehab team continue to assess barriers to discharge/monitor patient progress toward functional and medical goals  Care Tool:  Bathing              Bathing assist       Upper Body Dressing/Undressing Upper body dressing        Upper body assist      Lower Body Dressing/Undressing Lower body dressing            Lower body assist       Toileting Toileting    Toileting assist       Transfers Chair/bed transfer  Transfers assist           Locomotion Ambulation   Ambulation assist              Walk 10 feet activity   Assist     Assist level: Set up assist     Walk 50 feet activity   Assist  Walk 150 feet activity   Assist           Walk 10 feet on uneven surface  activity   Assist           Wheelchair     Assist               Wheelchair 50 feet with 2 turns activity    Assist            Wheelchair 150 feet activity     Assist          Blood pressure 99/66, pulse 77, temperature 98.5 F (36.9 C), resp. rate 16, height 5\' 8"  (1.727 m), weight 76.8 kg, SpO2 98 %.    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 Antithrombotics: -DVT/anticoagulation:Pharmaceutical:Lovenox -antiplatelet therapy: N/A 3. Pain Management:Gabapentin 200mg  TID not effective will increase to 300mg  , we discussed the process of gradually increasing pain med and that it may take several days, discussed need to participate in PT< OT, even if pain is not under optimal control  4. Mood:LCSW to follow for evaluation and support. -antipsychotic agents:  N/A 5. Neuropsych: This patientiscapable of making decisions on hisown behalf. 6. Skin/Wound Care:routine pressure relief measures. 7. Fluids/Electrolytes/Nutrition:Monitor I/O. Check lytes in am. 8. Dental pain: Ibuprofen prn effective.  --Educated on importance of oral care due to recessed gums. --Will order peridex bid 9. T2DM-new diagnosis: Hgb A1c-7.0--will consult dietician to educate on CM diet --Monitor BS ac/hs with SSI for elevated BS 10. Vitamin B 12 deficiency: Being supplemented 11. Hyponatremia: Slowly improving from 129-->134 today. Continue to monitor. 12. Leucocytosis: Likely reactive due to fever  steroids. No clear indication for augmentin  --Monitor for fevers and other signs of infection.  13.  Diarrhea , likely abx associated with d/c and monitor  14.  ? Hx oral pain, no c/o today monitor of abx LOS: 1 days A FACE TO FACE EVALUATION WAS PERFORMED  09/02/2020, 9:16 AM

## 2020-09-02 NOTE — Progress Notes (Signed)
Inpatient Rehabilitation Care Coordinator Assessment and Plan Patient Details  Name: Jacob Cameron MRN: [id number] Date of Birth: [birth year]  Today's Date: 09/02/2020  Hospital Problems: Principal Problem:   Axonal polyneuropathy  Past Medical History: History reviewed. No pertinent past medical history. Past Surgical History:  Past Surgical History:  Procedure Laterality Date  . IR FLUORO GUIDE CV LINE RIGHT  08/19/2020  . IR US GUIDE VASC ACCESS RIGHT  08/19/2020   Social History:  reports that he has been smoking cigarettes. He has a 2.00 pack-year smoking history. He has never used smokeless tobacco. He reports that he does not drink alcohol and does not use drugs.  Family / Support Systems Marital Status: Married Patient Roles: Spouse Children: Theatre manager (Son), Herbalist (Son) Anticipated Caregiver: 2 adult sons and apouse Ability/Limitations of Caregiver: None Caregiver Availability: 24/7  Social History Preferred language: Urdu [place] Religion:  Read: Yes Write: Yes Employment Status: Retired Public relations account executive Issues: n/a Guardian/Conservator: n/a   Abuse/Neglect Abuse/Neglect Assessment Can Be Completed: Yes Physical Abuse: Denies Verbal Abuse: Denies Sexual Abuse: Denies Exploitation of patient/patient's resources: Denies Self-Neglect: Denies  Emotional Status Pt's affect, behavior and adjustment status: no Recent Psychosocial Issues: no Psychiatric History: no Substance Abuse History: no  Patient / Family Perceptions, Expectations & Goals Pt/Family understanding of illness & functional limitations: yes Premorbid pt/family roles/activities: Indpendent with mobility and ADLS, not driving, no DME used Anticipated changes in roles/activities/participation: Will require some assistance/supervision Pt/family expectations/goals: MOD I  Recruitment consultant: None Premorbid Home Care/DME Agencies: None Transportation available at  discharge: family able to transport  Discharge Planning Living Arrangements: Children,Spouse/significant other Support Systems: Children,Spouse/significant other Type of Residence: Private residence (1 level home, level entry) Insurance Resources: Chartered certified accountant Screen Referred: No Living Expenses: Lives with family Money Management: Spouse,Patient,Family Does the patient have any problems obtaining your medications?: No Home Management: Indpendent Patient/Family Preliminary Plans: Some assistance Care Coordinator Anticipated Follow Up Needs: HH/OP Expected length of stay: 7-10 Days  Clinical Impression sw met with patient, introduced self, explained role and process and provided contact information. Interpreter not present, will revisit when available. Patient ready to get back in bed, reports being tired. Sw will continue to follow up.   Dyanne Iha 09/02/2020, 1:09 PM

## 2020-09-02 NOTE — Plan of Care (Signed)
Nutrition Education Note  RD consulted for nutrition education regarding diabetes.  Spoke with pt at beside. Pt declined use of interpreting services although RD offered multiple times.  Pt reports good appetite PTA. He states that he typically ate several meals daily and consumed foods like rice, fish, and other seafood. Pt reports that he enjoys vegetables and consumes them regularly. Pt reports that he drinks mostly water but occasionally consumed juice and soda.  Lab Results  Component Value Date   HGBA1C 7.0 (H) 08/14/2020    RD provided "Carbohydrate Counting for People with Diabetes" handout from the Academy of Nutrition and Dietetics. Discussed different food groups and their effects on blood sugar, emphasizing carbohydrate-containing foods. Provided list of carbohydrates and recommended serving sizes of common foods.  Discussed importance of controlled and consistent carbohydrate intake throughout the day. Provided examples of ways to balance meals/snacks and encouraged intake of high-fiber, whole grain complex carbohydrates. Teach back method used.  Expect fair to good compliance.  Current diet order is Heart Healthy/Carb Modified with 1.5 L fluid restriction, patient is consuming approximately 100% of meals at this time. Labs and medications reviewed. No further nutrition interventions warranted at this time. RD contact information provided. If additional nutrition issues arise, please re-consult RD.   Jacob Clause, MS, RD, LDN Inpatient Clinical Dietitian Please see [facility] for contact information.

## 2020-09-02 NOTE — Progress Notes (Signed)
Physical Therapy Session Note  Patient Details  Name: Jacob Cameron MRN: [id number] Date of Birth: 02/07/1956  Today's Date: 09/02/2020 PT Individual Time: 1109-1140 PT Individual Time Calculation (min): 31 min   and  Today's Date: 09/02/2020 PT Missed Time: 29 Minutes Missed Time Reason: Pain;Patient unwilling to participate  Short Term Goals: Week 1:  PT Short Term Goal 1 (Week 1): Pt will consistently perform sit<>stands using LRAD with min assist PT Short Term Goal 2 (Week 1): Pt will consistently perform bed<>chair transfers using LRAD with min assist PT Short Term Goal 3 (Week 1): Pt will ambulate at least 45ft using LRAD with min assist  Skilled Therapeutic Interventions/Progress Updates:    Pt received supine in bed, asleep though becomes more alert/awake throughout session. With significant encouragement pt agreeable to participate in OOB therapy though continues to state "I can't, the pain" but then starts initiating the movements to complete the mobility task. Therapist providing education on importance of therapy for increased strength and increased independence with functional mobility. Supine>sitting R EOB, HOB slightly elevated but no bedrails, with supervision. L squat pivot EOB>w/c mod assist for lifting/pivoting hips as pt declining using of RW at this time stating "I can't stand."   Transported to/from gym in w/c for time management and energy conservation. Gait training ~64ft 2x using RW with min assist for balance while ambulating and pt demonstrating increased postural sway and impaired BLE coordination with possible ataxia, decreased gait speed, and decreased BLE step lengths. After 2nd walk when pt turning to sit in chair he had B LE knee buckling requiring mod assist to control descent into wheelchair. Pt declines attempting stairs. Transported pt to ortho gym and set-up for stand/squat pivot car transfer to sedan height; however, pt stating "I can't" and therapist unable to  encourage pt to continue participating with pt requesting to return to his room. Pt agreeable to remain sitting up in w/c - left sitting with needs in reach, seat belt alarm on, and NT present. Missed 29 minutes of skilled physical therapy.  Therapy Documentation Precautions:  Precautions Precautions: Fall Restrictions Weight Bearing Restrictions: No  Pain: Continues to state "pain, pain, pain" but then states "not too much" and rates it as 6/10.    Therapy/Group: Individual Therapy  Ginny Forth , PT, DPT, CSRS  09/02/2020, 7:00 PM

## 2020-09-03 DIAGNOSIS — G629 Polyneuropathy, unspecified: Secondary | ICD-10-CM | POA: Diagnosis not present

## 2020-09-03 LAB — GLUCOSE, CAPILLARY
Glucose-Capillary: 114 mg/dL — ABNORMAL HIGH (ref 70–99)
Glucose-Capillary: 110 mg/dL — ABNORMAL HIGH (ref 70–99)
Glucose-Capillary: 169 mg/dL — ABNORMAL HIGH (ref 70–99)
Glucose-Capillary: 135 mg/dL — ABNORMAL HIGH (ref 70–99)

## 2020-09-03 LAB — PORPHYRINS, FRACTIONATION-PLASMA
Coproporphyrin.: <1 ug/dL (ref <1.0)
Heptacarboxyl Porphyrins: <1 ug/dL (ref <1.0)
Hexacarboxyl Porphyrins: <1.5 ug/dL (ref <1.5)
Pentacarboxyl Porphyrins: <1 ug/dL (ref <1.0)
Protoporphyrin: <1.5 ug/dL (ref <1.5)
Uroporphyrin: <1.5 ug/dL (ref <1.5)

## 2020-09-03 LAB — VITAMIN B1: Vitamin B1 (Thiamine): 129.3 nmol/L (ref 66.5–200.0)

## 2020-09-03 NOTE — Progress Notes (Signed)
Occupational Therapy Session Note  Patient Details  Name: Zakari Bathe MRN: [id number] Date of Birth: 10-Jan-1956  Today's Date: 09/03/2020 OT Individual Time: 1303-1403 OT Individual Time Calculation (min): 60 min    Short Term Goals: Week 1:  OT Short Term Goal 1 (Week 1): patient will tolerate change of position and sitting in w/c 2+ hours OT Short Term Goal 2 (Week 1): patient will complete bed mobility and functional transfers with min A OT Short Term Goal 3 (Week 1): patient will complete bathing and grooming tasks w/c level with min a OT Short Term Goal 4 (Week 1): patient will complete toileting with min A  Skilled Therapeutic Interventions/Progress Updates:    Pt sitting in the wheelchair to start session.  He was pushed over to the sink for bathing tasks and completed UB selfcare with min instructional cueing for sequencing and supervision.  He was able to complete donning pullover shirt with setup as well as gripper socks, but declined doffing his pants or washing any of his lower body.  After completion of bathing, took pt down to the ortho gym to work on sit to stand, standing balance, and BUE coordination using the BITs.  He completed 2 intervals of 2 mins using [employer] single target program.  He needed min assist for standing balance with mod assist for sit to stand on 1/2 attempts.  He averaged 1.75-1.8 seconds between each target with 65% accuracy on the first interval and 86% accuracy on the second.  Noted greater difficulty with using the RUE to push the targets compared to the left.  Increased fatigue noted with standing as well with pt needing rest after 2 mins.  HR in the 90s as well as O2 being 96% on room air.  Finished session with return to the room via wheelchair and pt left sitting up with safety belt in place and call button and phone in reach.    Therapy Documentation Precautions:  Precautions Precautions: Fall Precaution Comments: Translator helpful but pt  speaks pretty good English Restrictions Weight Bearing Restrictions: No  Pain: Pain Assessment Pain Score: 5  Pain Type: Acute pain Pain Location: Leg Pain Orientation: Right;Left Pain Descriptors / Indicators: Discomfort Pain Onset: With Activity Pain Intervention(s): Repositioned ADL: See Care Tool Section for some details of mobility and selfcare  Therapy/Group: Individual Therapy  Keniel Ralston OTR/L 09/03/2020, 4:10 PM

## 2020-09-03 NOTE — Progress Notes (Signed)
Physical Therapy Session Note  Patient Details  Name: Jacob Cameron MRN: [id number] Date of Birth: [birth year]  Today's Date: 09/03/2020 PT Individual Time: 1105-1200 PT Individual Time Calculation (min): 55 min   Short Term Goals: Week 1:  PT Short Term Goal 1 (Week 1): Pt will consistently perform sit<>stands using LRAD with min assist PT Short Term Goal 2 (Week 1): Pt will consistently perform bed<>chair transfers using LRAD with min assist PT Short Term Goal 3 (Week 1): Pt will ambulate at least 17ft using LRAD with min assist  Skilled Therapeutic Interventions/Progress Updates:    Pt received supine in bed reporting he is feeling "better" today and pt agreeable to therapy session. Assessed vitals in supine: L arm BP 159/145, HR 113bpm reassessed in R arm: BP 95/66 (MAP 75), HR 90bpm - pt denies any symptoms. Supine>sitting L EOB, HOB flat and not using bedrail, supervision. Sit>stand EOB>RW with pt relying heavily via compensating by pushing up with hands on RW to come to stand. R stand pivot EOB>w/c using RW with min assist for balance - pt continues to demo poor eccentric control when going to sit in w/c with heavy landing throughout session despite cuing for improvement.  Transported to/from gym in w/c for time management and energy conservation. Gait training ~73ft using RW with heavy min assits for balance and pt demonstrating poor B LE coordination with varying step lengths and width of BOS and increased postural sway. Pt has poor awareness of deficits and impaired gait mechanics when questioned. Repeated sit<>stands to/from EOM attempted with no UE support but pt unable and continues to push down through RW - transitioned to B UE support on therapist's B arm support and therapist guarding from front and providing min/mod assist for controlled lowering and lifting - going from stand>sit pt demonstrates lack of anterior trunk lean/hip flexion causing him to loose his balance backwards and have  uncontrolled descent. Standing B UE support on RW repeated R/L forward foot taps on 4" step to external target to work on strength and coordintation - increased impairment noted with R LE with impaired ability to lift R LE off of step as opposed to drgagging foot backwards off step. Gait training 23ft using RW, min assist for balance - demos improving B LE coordination/stepping. Performed B LE reciprocal movements on Kinetron, from sitting in w/c, focusing on strengthening against 25cm/sec resistance for x2. Transported back to room and pt agreeable to remain sitting up in w/c - left with needs in reach and seat belt alarm on.  Therapy Documentation Precautions:  Precautions Precautions: Fall Restrictions Weight Bearing Restrictions: No  Pain: Reports "better" and "OK" upon questioning.  Therapy/Group: Individual Therapy  Ginny Forth , PT, DPT, CSRS  09/03/2020, 8:00 AM

## 2020-09-03 NOTE — Progress Notes (Signed)
PROGRESS NOTE   Subjective/Complaints: No issues overnight, pain is better today, does not think meds have to increase  ROS- neg CP, SOB, N/V , + diarrhea  Objective:   No results found. Recent Labs    09/01/20 0407 09/01/20 2031 09/02/20 0624  WBC 18.4*  --  15.1*  HGB 10.7* 10.2* 10.8*  HCT 31.3* 30.0* 34.2*  PLT 373  --  363   Recent Labs    09/01/20 0407 09/01/20 2031 09/02/20 0624  NA 134* 137 135  K 4.3 3.6 4.1  CL 100 101 103  CO2 24  --  22  GLUCOSE 189* 127* 110*  BUN 29* 28* 29*  CREATININE 0.96 0.80 1.03  CALCIUM 8.5*  --  8.2*    Intake/Output Summary (Last 24 hours) at 09/03/2020 0810 Last data filed at 09/02/2020 2100 Gross per 24 hour  Intake 840 ml  Output 1200 ml  Net -360 ml        Physical Exam: Vital Signs Blood pressure (!) 84/59, pulse 90, temperature 98.5 F (36.9 C), resp. rate 18, height 5\' 8"  (1.727 m), weight 75.4 kg, SpO2 94 %.    General: No acute distress Mood and affect are appropriate Heart: Regular rate and rhythm no rubs murmurs or extra sounds Lungs: Clear to auscultation, breathing unlabored, no rales or wheezes Abdomen: Positive bowel sounds, soft nontender to palpation, nondistended Extremities: No clubbing, cyanosis, or edema Skin: No evidence of breakdown, no evidence of rash Neurologic: Cranial nerves II through XII intact, motor strength is 4/5 in bilateral deltoid, bicep, tricep, grip, hip flexor, knee extensors, ankle dorsiflexor and plantar flexor Sensory exam normal sensation to light touch and proprioception in bilateral upper and lower extremities Cerebellar exam normal finger to nose to finger as well as heel to shin in bilateral upper and lower extremities Musculoskeletal: Full range of motion in all 4 extremities. No joint swelling    Assessment/Plan: 1. Functional deficits which require 3+ hours per day of interdisciplinary therapy in a  comprehensive inpatient rehab setting.  Physiatrist is providing close team supervision and 24 hour management of active medical problems listed below.  Physiatrist and rehab team continue to assess barriers to discharge/monitor patient progress toward functional and medical goals  Care Tool:  Bathing    Body parts bathed by patient: Face         Bathing assist Assist Level: Supervision/Verbal cueing     Upper Body Dressing/Undressing Upper body dressing   What is the patient wearing?: Hospital gown only    Upper body assist Assist Level: Maximal Assistance - Patient 25 - 49%    Lower Body Dressing/Undressing Lower body dressing    Lower body dressing activity did not occur: Refused       Lower body assist       Toileting Toileting Toileting Activity did not occur (Clothing management and hygiene only): Refused  Toileting assist       Transfers Chair/bed transfer  Transfers assist  Chair/bed transfer activity did not occur: Refused        Locomotion Ambulation   Ambulation assist   Ambulation activity did not occur: Refused  Walk 10 feet activity   Assist  Walk 10 feet activity did not occur: Refused  Assist level: Set up assist     Walk 50 feet activity   Assist Walk 50 feet with 2 turns activity did not occur: Refused         Walk 150 feet activity   Assist Walk 150 feet activity did not occur: Safety/medical concerns         Walk 10 feet on uneven surface  activity   Assist Walk 10 feet on uneven surfaces activity did not occur: Safety/medical concerns         Wheelchair     Assist Will patient use wheelchair at discharge?:  (TBD)             Wheelchair 50 feet with 2 turns activity    Assist            Wheelchair 150 feet activity     Assist          Blood pressure (!) 84/59, pulse 90, temperature 98.5 F (36.9 C), resp. rate 18, height 5\' 8"  (1.727 m), weight 75.4 kg,  SpO2 94 %.    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 Antithrombotics: -DVT/anticoagulation:Pharmaceutical:Lovenox -antiplatelet therapy: N/A 3. Pain Management:Gabapentin 300mg  TID-improved  , we discussed the process of gradually increasing pain med and that it may take several days, discussed need to participate in PT< OT, even if pain is not under optimal control  4. Mood:LCSW to follow for evaluation and support. -antipsychotic agents: N/A 5. Neuropsych: This patientiscapable of making decisions on hisown behalf. 6. Skin/Wound Care:routine pressure relief measures. 7. Fluids/Electrolytes/Nutrition:Monitor I/O. Check lytes in am. 8. Dental pain: Ibuprofen prn effective.  --Educated on importance of oral care due to recessed gums. --Will order peridex bid 9. T2DM-new diagnosis: Hgb A1c-7.0--will consult dietician to educate on CM diet --Monitor BS ac/hs with SSI for elevated BS CBG (last 3)  Recent Labs    09/02/20 1638 09/02/20 2107 09/03/20 0623  GLUCAP 100* 120* 114*    10. Vitamin B 12 deficiency: Being supplemented 11. Hyponatremia: Slowly improving from 129-->134 today. Continue to monitor. 12. Leucocytosis: Likely reactive due to fever  steroids. No clear indication for augmentin  --Monitor for fevers and other signs of infection.  13.  Diarrhea , likely abx associated with d/c and monitor  14.  ? Hx oral pain, no c/o today monitor of abx LOS: 2 days A FACE TO FACE EVALUATION WAS PERFORMED  2108 09/03/2020, 8:10 AM

## 2020-09-03 NOTE — Progress Notes (Signed)
Physical Therapy Session Note  Patient Details  Name: Elena Cothern MRN: [id number] Date of Birth: 08-11-1955  Today's Date: 09/03/2020 PT Individual Time: 1430-1520 PT Individual Time Calculation (min): 50 min   Short Term Goals: Week 1:  PT Short Term Goal 1 (Week 1): Pt will consistently perform sit<>stands using LRAD with min assist PT Short Term Goal 2 (Week 1): Pt will consistently perform bed<>chair transfers using LRAD with min assist PT Short Term Goal 3 (Week 1): Pt will ambulate at least 84ft using LRAD with min assist  Skilled Therapeutic Interventions/Progress Updates:    Patient received sitting up in wc, agreeable to PT. He denies pain, but reports fatigue. PT propelling patient to therapy gym for time management and energy conservation. BP in sitting RUE: 81/58 (67) 97BPM. He denies feeling light headed/dizzy. Attempted to assess BP in standing, however, patient unable to remain standing for entirety of time. BP upon sitting back down: 94/63 (72) 90 BPM. He was able to ambulate 39ft before needing to sit. Uncontrolled sit into wc despite multimodal cues to reach back to control descent. Patient with heavy reliance on B UE on RW. PT adding 1.5# ankle weights to B LE for improved proprioceptive input with slight improvement of coordination of BLE ambulating additional 53ft x4 with seated rest breaks between each bout. Standing in parallel bars with lateral toe tapping 2x12 each with B UE support on bars. PT attempting to have patient complete 3rd round, however patient began stating "I'm too tired to do more" and requested to return to his room. PT unable to redirect patient to therapeutic tasks. He transferred back to bed via squat pivot with MinA. Bed alarm on, call light within reach.   Therapy Documentation Precautions:  Precautions Precautions: Fall Restrictions Weight Bearing Restrictions: No    Therapy/Group: Individual Therapy  Elizebeth Koller, PT,  DPT, CBIS  09/03/2020, 7:52 AM

## 2020-09-03 NOTE — IPOC Note (Signed)
Overall Plan of Care [facility]) Patient Details Name: Jacob Cameron MRN: [id number] DOB: 04-07-1956  Admitting Diagnosis: Axonal polyneuropathy  Hospital Problems: Principal Problem:   Axonal polyneuropathy     Functional Problem List: Nursing Endurance,Pain  PT Balance,Perception,Behavior,Safety,Edema,Sensory,Endurance,Skin Integrity,Motor,Nutrition,Pain  OT Endurance,Pain,Sensory,Balance  SLP    TR         Basic ADL's: OT Grooming,Bathing,Dressing,Toileting     Advanced  ADL's: OT       Transfers: PT Bed Mobility,Bed to [employer]  OT Toilet     Locomotion: PT Ambulation,Stairs,Wheelchair Mobility     Additional Impairments: OT Fuctional Use of Upper Extremity  SLP        TR      Anticipated Outcomes Item Anticipated Outcome  Self Feeding independent  Swallowing      Basic self-care  set up/supervision  Toileting  supervision   Bathroom Transfers supervision  Bowel/Bladder     Transfers  supervision using LRAD  Locomotion  CGA using LRAD  Communication     Cognition     Pain     Safety/Judgment      Therapy Plan: PT Intensity: Minimum of 1-2 x/day ,45 to 90 minutes PT Frequency: 5 out of 7 days PT Duration Estimated Length of Stay: 10-14 days OT Intensity: Minimum of 1-2 x/day, 45 to 90 minutes OT Frequency: 5 out of 7 days OT Duration/Estimated Length of Stay: 10-14 days     Due to the current state of emergency, patients may not be receiving their 3-hours of Medicare-mandated therapy.   Team Interventions: Nursing Interventions Patient/Family Education,Medication Management,Pain Management  PT interventions Ambulation/gait training,Community reintegration,DME/adaptive equipment instruction,Neuromuscular re-education,Psychosocial support,Stair training,UE/LE Strength taining/ROM,Balance/vestibular training,Wheelchair propulsion/positioning,Discharge planning,Functional electrical stimulation,Pain management,Skin care/wound  management,Therapeutic Activities,UE/LE Coordination activities,Cognitive remediation/compensation,Disease management/prevention,Patient/family education,Splinting/orthotics,Functional mobility training,Therapeutic Exercise,Visual/perceptual remediation/compensation  OT Interventions Balance/vestibular training,Self Care/advanced ADL retraining,DME/adaptive equipment instruction,Pain management,Patient/family education,UE/LE Coordination activities,Discharge planning,Functional mobility training,Therapeutic Activities  SLP Interventions    TR Interventions    SW/CM Interventions Discharge Planning,Psychosocial Support,Patient/Family Education,Disease Management/Prevention   Barriers to Discharge MD  Medical stability  Nursing      PT Behavior    OT      SLP      SW       Team Discharge Planning: Destination: PT-Home ,OT- Home , SLP-  Projected Follow-up: PT-Outpatient PT,24 hour supervision/assistance, OT-  Home health OT, SLP-  Projected Equipment Needs: PT-To be determined, OT- 3 in 1 bedside comode, SLP-  Equipment Details: PT- , OT-  Patient/family involved in discharge planning: PT- Patient,  OT-Patient, SLP-   MD ELOS: 7-10d Medical Rehab Prognosis:  Good Assessment:  65 year old male in relatively good health who was admitted on 08/12/2020 with bilateral lower extremity and right upper extremity paresthesias with shooting painswith BLE weakness. Patient initially refused MRI but was agreeable to have it with sedation.Marland KitchenMRI brain negative. MRI spine was negative for injury and showed mild spondylosis of cervical, thoracic and lumbar spine.LP recommended for work-up and patient refused this. 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   He continued to have issues with pain, nausea and progressive symptoms therefore plasmapheresis recommended and patient/family agreeable therefore dialysis catheter placed on 02/09 by Dr. Mauri Reading and completed 3 doses with minimal improvement and high dose IV steroids added 02/17 due to  suspicion of GBS variant with 2 additional doses of PLEX-->last today. 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 Noted to have new onset diabetes with Hgb A1c-7 --doubt cause of neuropathy. He has had decrease in symptoms with improvement in strength but continues to be limtied by weakness with balance deficits. CIR recommended due to functional decline.    See Team Conference Notes for weekly updates to the plan of care

## 2020-09-04 DIAGNOSIS — G629 Polyneuropathy, unspecified: Secondary | ICD-10-CM | POA: Diagnosis not present

## 2020-09-04 LAB — GLUCOSE, CAPILLARY
Glucose-Capillary: 141 mg/dL — ABNORMAL HIGH (ref 70–99)
Glucose-Capillary: 181 mg/dL — ABNORMAL HIGH (ref 70–99)
Glucose-Capillary: 154 mg/dL — ABNORMAL HIGH (ref 70–99)
Glucose-Capillary: 173 mg/dL — ABNORMAL HIGH (ref 70–99)

## 2020-09-04 NOTE — Progress Notes (Signed)
Occupational Therapy Session Note  Patient Details  Name: Jacob Cameron MRN: [id number] Date of Birth: [birth year]  Today's Date: 09/04/2020 OT Individual Time: 1431-1531 OT Individual Time Calculation (min): 60 min    Short Term Goals: Week 1:  OT Short Term Goal 1 (Week 1): patient will tolerate change of position and sitting in w/c 2+ hours OT Short Term Goal 2 (Week 1): patient will complete bed mobility and functional transfers with min A OT Short Term Goal 3 (Week 1): patient will complete bathing and grooming tasks w/c level with min a OT Short Term Goal 4 (Week 1): patient will complete toileting with min A  Skilled Therapeutic Interventions/Progress Updates:    Pt in wheelchair to start session.  Had him work on UE strengthening during session beginning with wheelchair mobility down to the ortho gym.  He needed min guard assist and increased time secondary to veering to the right at times, as that UE is slightly weaker.  He required two rest breaks to complete task.  Once in the gym had him work further on UE strengthening with use of the ergonometer.  He was able to complete three, 5 min sessions with resistance on level 7 and RPMs maintained at 20-30 with Random Program.  Heart rate in the 90s with O2 at 96% on room air throughout.  Two sets were completed peddling forward and one backwards.  Finished session with return to the room via wheelchair and pt being educated on therapy putty exercises for hand strengthening.  Handout provided for reference with pt able to return demonstrate exercises for gross digit flexion,  Intrinsic hand strengthening, and tip to tip pinch with min instructional cueing.  Red medium resistance putty provided for use with pt instructed to complete 20-30 mins per day. He transferred to the EOB with min assist using the RW and to supine with supervision.  Call button and phone in reach with safety alarm in place.       Therapy Documentation Precautions:   Precautions Precautions: Fall Precaution Comments: Translator helpful but pt speaks pretty good English Restrictions Weight Bearing Restrictions: No   Pain: Pain Assessment Pain Scale: Faces Faces Pain Scale: Hurts a little bit Pain Type: Neuropathic pain Pain Location: Hand Pain Orientation: Right;Left Pain Descriptors / Indicators: Discomfort Pain Onset: With Activity Pain Intervention(s): Emotional support ADL: See Care Tool Section for some details of mobility and selfcare  Therapy/Group: Individual Therapy  Saralee Bolick OTR/L 09/04/2020, 4:43 PM

## 2020-09-04 NOTE — Progress Notes (Signed)
Physical Therapy Session Note  Patient Details  Name: Jacob Cameron MRN: [id number] Date of Birth: Sep 17, 1955  Today's Date: 09/04/2020 PT Individual Time: 1115-1201 PT Individual Time Calculation (min): 46 min   Short Term Goals: Week 1:  PT Short Term Goal 1 (Week 1): Pt will consistently perform sit<>stands using LRAD with min assist PT Short Term Goal 2 (Week 1): Pt will consistently perform bed<>chair transfers using LRAD with min assist PT Short Term Goal 3 (Week 1): Pt will ambulate at least 61ft using LRAD with min assist  Skilled Therapeutic Interventions/Progress Updates:    Patient supine in bed upon PT arrival. Patient alert and agreeable to PT session. Patient denied pain throughout session.  Therapeutic Activity: Bed Mobility: Patient performed supine --> sit attempting to sit straight up from supine with hernia apparent at R lower abdomen. Pt instructed on log roll to L side, then bringing BLE off bed and pushing UB up to seated position from bed surface. Pt states this was much easier to perform. Pt will require add'l training with addition of abdominal TA bracing for improved core stability around hernia. VC provided for new technique throughout. Transfers: Patient guided in blocked practice of  SPVT transfers in order to improve controlled descent to sit. Instructions provided with visual demonstration prior to start of practice. SPVT performed w/c <> armchair using RW. Requires Min A for power up and pivots with CGA. Pt improves quality of transfer with each performance. Reaches back for armrests prior to descending and is able to control self to seat using BUE and BLE with CGA.  Gait Training:  Patient ambulated 68' x1 and 30' x1 using RW and Min A for balance and to prevent knee buckling. Pt demos long stride length and provided with vc for controlling knee extension and holding knees "strong" during stance phase.  Quality of gait decreases when he reaches ~25 feet. During 30'  bout, pt reaches 29' and is completing 180 deg turn to w/c when his L knee buckles and he quickly sits to his w/c with Min A.   Patient seated upright in w/c at end of session with brakes locked, belt alarm set, and all needs within reach.  Therapy Documentation Precautions:  Precautions Precautions: Fall Precaution Comments: Translator helpful but pt speaks pretty good English Restrictions Weight Bearing Restrictions: No  Therapy/Group: Individual Therapy  Loel Dubonnet 09/04/2020, 5:09 PM

## 2020-09-04 NOTE — Progress Notes (Signed)
Physical Therapy Session Note  Patient Details  Name: Jacob Cameron MRN: [id number] Date of Birth: [birth year]  Today's Date: 09/04/2020 PT Individual Time: 1330-1425 PT Individual Time Calculation (min): 55 min   Short Term Goals: Week 1:  PT Short Term Goal 1 (Week 1): Pt will consistently perform sit<>stands using LRAD with min assist PT Short Term Goal 2 (Week 1): Pt will consistently perform bed<>chair transfers using LRAD with min assist PT Short Term Goal 3 (Week 1): Pt will ambulate at least 58ft using LRAD with min assist  Skilled Therapeutic Interventions/Progress Updates:    Patient received sitting up in wc, agreeable to PT. He denies pain, but reports fatigue. PT transporting patient to gym for time management and energy conservation. BP in sitting: 93/53 (66) 86BPM. BP in standing: 99.66 (76) 97BPM. He was able to ambulate 2x63ft with RW and ModA. He required seated rest break between bouts due to B LE fatigue. Patient noted to have very uncoordinated placement of LE and lack of control of RW throughout gait distance. Patient relying heavily on UE due to LE weakness/lack of trust in B LE. Foot flat contact, but appropriate AROM of dorsiflexors noted at times. Patient, at times, with excessive knee extension in stance to achieve greater stability of BLE in stance phase of gait cycle.  Patient then ambulating 2x23ft with MinA and 3# ankle weights to B LE and 18# total added to walker. Improved control of walker and B LE noted with this increased proprioceptive input. Patient completing 2x4 step ups onto 4" box in parallel bars with MinA. Descending leg often buckling when accepting weight. B UE support provided on bars. NuStep 5x2' with lengthy rest breaks between due to fatigue. Patient transferring back to wc via squat pivot with MinA. Seatbelt alarm on, call light within reach.    Therapy Documentation Precautions:  Precautions Precautions: Fall Precaution Comments: Translator helpful  but pt speaks pretty good English Restrictions Weight Bearing Restrictions: No    Therapy/Group: Individual Therapy  Westley Foots 09/04/2020, 7:50 AM

## 2020-09-04 NOTE — Progress Notes (Signed)
PROGRESS NOTE   Subjective/Complaints: Tolerating therapy from a pain standpoint   ROS- neg CP, SOB, N/V , + diarrhea  Objective:   No results found. Recent Labs    09/01/20 2031 09/02/20 0624  WBC  --  15.1*  HGB 10.2* 10.8*  HCT 30.0* 34.2*  PLT  --  363   Recent Labs    09/01/20 2031 09/02/20 0624  NA 137 135  K 3.6 4.1  CL 101 103  CO2  --  22  GLUCOSE 127* 110*  BUN 28* 29*  CREATININE 0.80 1.03  CALCIUM  --  8.2*    Intake/Output Summary (Last 24 hours) at 09/04/2020 0819 Last data filed at 09/03/2020 2036 Gross per 24 hour  Intake 400 ml  Output 600 ml  Net -200 ml        Physical Exam: Vital Signs Blood pressure (!) 87/61, pulse 86, temperature 98.2 F (36.8 C), resp. rate 14, height 5\' 8"  (1.727 m), weight 74.2 kg, SpO2 100 %.    General: No acute distress Mood and affect are appropriate Heart: Regular rate and rhythm no rubs murmurs or extra sounds Lungs: Clear to auscultation, breathing unlabored, no rales or wheezes Abdomen: Positive bowel sounds, soft nontender to palpation, nondistended Extremities: No clubbing, cyanosis, or edema Skin: No evidence of breakdown, no evidence of rash Neurologic: Cranial nerves II through XII intact, motor strength is 4/5 in bilateral deltoid, bicep, tricep, grip, hip flexor, knee extensors, ankle dorsiflexor and plantar flexor Sensory exam normal sensation to light touch and proprioception in bilateral upper and lower extremities Cerebellar exam normal finger to nose to finger as well as heel to shin in bilateral upper and lower extremities Musculoskeletal: Full range of motion in all 4 extremities. No joint swelling    Assessment/Plan: 1. Functional deficits which require 3+ hours per day of interdisciplinary therapy in a comprehensive inpatient rehab setting.  Physiatrist is providing close team supervision and 24 hour management of active medical  problems listed below.  Physiatrist and rehab team continue to assess barriers to discharge/monitor patient progress toward functional and medical goals  Care Tool:  Bathing    Body parts bathed by patient: Face     Body parts n/a: Front perineal area,Buttocks,Right upper leg,Left upper leg (did not attempt)   Bathing assist Assist Level: Supervision/Verbal cueing     Upper Body Dressing/Undressing Upper body dressing   What is the patient wearing?: Hospital gown only    Upper body assist Assist Level: Maximal Assistance - Patient 25 - 49%    Lower Body Dressing/Undressing Lower body dressing    Lower body dressing activity did not occur: Refused       Lower body assist       Toileting Toileting Toileting Activity did not occur (Clothing management and hygiene only): Refused  Toileting assist Assist for toileting: Maximal Assistance - Patient 25 - 49%     Transfers Chair/bed transfer  Transfers assist  Chair/bed transfer activity did not occur: Refused  Chair/bed transfer assist level: Minimal Assistance - Patient > 75% Chair/bed transfer assistive device:   Ambulation assist   Ambulation activity did not occur:  Refused  Assist level: Minimal Assistance - Patient > 75% Assistive device: Walker-rolling Max distance: 68ft   Walk 10 feet activity   Assist  Walk 10 feet activity did not occur: Refused  Assist level: Minimal Assistance - Patient > 75% Assistive device: Walker-rolling   Walk 50 feet activity   Assist Walk 50 feet with 2 turns activity did not occur: Refused         Walk 150 feet activity   Assist Walk 150 feet activity did not occur: Safety/medical concerns         Walk 10 feet on uneven surface  activity   Assist Walk 10 feet on uneven surfaces activity did not occur: Safety/medical concerns         Wheelchair     Assist Will patient use wheelchair at discharge?:  (TBD)              Wheelchair 50 feet with 2 turns activity    Assist            Wheelchair 150 feet activity     Assist          Blood pressure (!) 87/61, pulse 86, temperature 98.2 F (36.8 C), resp. rate 14, height 5\' 8"  (1.727 m), weight 74.2 kg, SpO2 100 %.    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 Antithrombotics: -DVT/anticoagulation:Pharmaceutical:Lovenox -antiplatelet therapy: N/A 3. Pain Management:Gabapentin 300mg  TID-improved  , we discussed the process of gradually increasing pain med and that it may take several days, discussed need to participate in PT< OT, even if pain is not under optimal control  4. Mood:LCSW to follow for evaluation and support. -antipsychotic agents: N/A 5. Neuropsych: This patientiscapable of making decisions on hisown behalf. 6. Skin/Wound Care:routine pressure relief measures. 7. Fluids/Electrolytes/Nutrition:Monitor I/O. Check lytes in am. 8. Dental pain: Ibuprofen prn effective.  --Educated on importance of oral care due to recessed gums. --Will order peridex bid 9. T2DM-new diagnosis: Hgb A1c-7.0--will consult dietician to educate on CM diet --Monitor BS ac/hs with SSI for elevated BS CBG (last 3)  Recent Labs    09/03/20 1617 09/03/20 2047 09/04/20 0350  GLUCAP 169* 135* 141*  controlled 2/25  10. Vitamin B 12 deficiency: Being supplemented 11. Hyponatremia: Slowly improving from 129-->134 today. Continue to monitor. 12. Leucocytosis: Likely reactive due to fever  steroids. No clear indication for augmentin  --Monitor for fevers and other signs of infection.  13.  Diarrhea , likely abx associated with d/c and monitor  14.  ? Hx oral pain, no c/o today monitor of abx LOS: 3 days A FACE TO  FACE EVALUATION WAS PERFORMED  09/06/20 09/04/2020, 8:19 AM

## 2020-09-05 LAB — GLUCOSE, CAPILLARY
Glucose-Capillary: 114 mg/dL — ABNORMAL HIGH (ref 70–99)
Glucose-Capillary: 95 mg/dL (ref 70–99)
Glucose-Capillary: 118 mg/dL — ABNORMAL HIGH (ref 70–99)
Glucose-Capillary: 129 mg/dL — ABNORMAL HIGH (ref 70–99)

## 2020-09-05 MED ORDER — DULOXETINE HCL 30 MG PO CPEP
30.0000 mg | ORAL_CAPSULE | Freq: Every day | ORAL | Status: DC
  Administered 2020-09-05: 30 mg via ORAL
  Filled 2020-09-05: qty 1

## 2020-09-05 MED ORDER — GABAPENTIN 300 MG PO CAPS
600.0000 mg | ORAL_CAPSULE | Freq: Two times a day (BID) | ORAL | Status: DC
  Administered 2020-09-05 – 2020-09-07 (×4): 600 mg via ORAL
  Filled 2020-09-05 (×4): qty 2

## 2020-09-05 MED ORDER — DULOXETINE HCL 60 MG PO CPEP: 60.0000 mg | ORAL_CAPSULE | Freq: Every day | ORAL | Status: DC

## 2020-09-05 NOTE — Progress Notes (Signed)
Occupational Therapy Session Note  Patient Details  Name: Jacob Cameron MRN: [id number] Date of Birth: December 15, 1955  Today's Date: 09/05/2020 OT Individual Time: 1093-2355 OT Individual Time Calculation (min): 53 min    Short Term Goals: Week 1:  OT Short Term Goal 1 (Week 1): patient will tolerate change of position and sitting in w/c 2+ hours OT Short Term Goal 2 (Week 1): patient will complete bed mobility and functional transfers with min A OT Short Term Goal 3 (Week 1): patient will complete bathing and grooming tasks w/c level with min a OT Short Term Goal 4 (Week 1): patient will complete toileting with min A  Skilled Therapeutic Interventions/Progress Updates:    Session 1: ([id number])  Pt in bed to start session.  He was able to transfer to sitting with supervision.  He declined bathing and dressing stating he had already cleaned up today.  He completed transfer to the wheelchair with min assist using the RW for support.  Therapist then had him roll down to the dayroom with increased time and supervision for use of the Nustep.  He completed 1 set of 5 mins to start with resistance on level 4 and steps per min at 47.  HR at 91 with O2 sats at 96% at rest post activity.  Next he completed 2 sets of 2.5 mins each using just his LEs.  Increased difficulty noted with increased fatigue and steps per min at 36.  Therapist had to assist with starting each set and then throughout he would have to occasionally reposition his feet as they would slide off of the foot rests.  Once complete, he transferred back to the wheelchair and was taken down to the ADL apartment where he was educated on tub transfers with use of the tub bench.  He was able to return demonstrate completion of transfer using the RW and min assist.  Pt with increased report of pain/ burning in his legs and hands, stating that he had gotten pain medicine earlier, but it was not working as well.  Finished session with return to the room via pt  pushing his chair.  Two rest breaks given to complete task.  He then washed his hands at the sink and was left with NT to check his CBG.   Call button in reach with safety belt in place.    Session 2:  [phone]) Pt was taken to the therapy gym for session via wheelchair with transfer to the therapy mat with min assist.  He then worked on standing to squat transitions with BUE support on the RW.  He needed min to mod facilitation for completion of 3 sets of 5 reps.  Progressed to working on standing while integrating UE use to pick up and toss bean bags to the [employer].  He needed min assist for balance while reaching and tossing the bean bags with both the right and left hands.  When reaching down, he demonstrates increased difficulty with knee flexion and tends to try and flex his trunk for compensation.  Transitioned to sitting where he completed UE strengthening with use of a 5 lb dowel rod.  He performed 2 sets of 10 reps for shoulder flexion bilaterally as well as completing 2 sets of 20 reps using the dowel rod to hit the beach ball back and forth with the therapist.  Finished session with return to the wheelchair and return to the room.  He was left in the wheelchair with the call button  and phone in reach awaiting PT session.    Therapy Documentation Precautions:  Precautions Precautions: Fall Precaution Comments: Translator helpful but pt speaks pretty good English Restrictions Weight Bearing Restrictions: No  Pain: Pain Assessment Pain Scale: Faces Faces Pain Scale: No hurt Pain Type: Neuropathic pain Pain Location: Foot Pain Orientation: Right;Left Pain Descriptors / Indicators: Discomfort;Burning Pain Onset: On-going Pain Intervention(s): Repositioned;Emotional support ADL: See Care Tool Section for some details of mobility and selfcare  Therapy/Group: Individual Therapy  Everado Pillsbury OTR/L 09/05/2020, 12:48 PM

## 2020-09-05 NOTE — Progress Notes (Signed)
Physical Therapy Session Note  Patient Details  Name: Jacob Cameron MRN: [id number] Date of Birth: 05/10/1956  Today's Date: 09/05/2020 PT Individual Time: 9211-9417 PT Individual Time Calculation (min): 26 min   and  Today's Date: 09/05/2020 PT Missed Time: 34 Minutes Missed Time Reason: Patient fatigue  Short Term Goals: Week 1:  PT Short Term Goal 1 (Week 1): Pt will consistently perform sit<>stands using LRAD with min assist PT Short Term Goal 2 (Week 1): Pt will consistently perform bed<>chair transfers using LRAD with min assist PT Short Term Goal 3 (Week 1): Pt will ambulate at least 28ft using LRAD with min assist  Skilled Therapeutic Interventions/Progress Updates:    Pt received sitting in w/c reports feeling fatigued from other therapy sessions but agreeable to current session.  Transported to/from gym in w/c for time management and energy conservation. Gait training ~25ft 2x (seated break between) using RW with min assist for balance - demos improved B LE coordination with more symmetrical step lengths and more normal/consistent BOS although continues to rely significantly on B UE support with elevated shoulders. Demonstrates significant improvement in using RW to turn fully prior to sitting down in w/c and demonstrates significant improvement in controlled eccentric descent when sitting. Repeated sit<>stands to/from significantly elevated EOM, no UE support, 2x5 reps working on B LE functional strengthening and motor planning to decrease B UE support during tranfers and increased anterior trunk lean/weight shift. Pt demonstrating significantly increasing fatigue and requests to return to room. L squat pivot EOM>w/c with CGA/min assist. Transported back to room. L squat pivot w/c>EOM due with CGA. Sit>supine supervision. Pt left supine in bed with needs in reach and bed alarm on. Missed 34 minutes of skilled physical therapy.  Therapy Documentation Precautions:  Precautions Precautions:  Fall Precaution Comments: Translator helpful but pt speaks pretty good English Restrictions Weight Bearing Restrictions: No  Pain: Reports pain in B LEs and B UEs, unrated, pt reports premedicated. Provided rest breaks, emotional support, and distraction for pain management. Pt also reports his feet feel more "cold" today.    Therapy/Group: Individual Therapy  Ginny Forth , PT, DPT, CSRS  09/05/2020, 12:12 PM

## 2020-09-06 DIAGNOSIS — G629 Polyneuropathy, unspecified: Secondary | ICD-10-CM | POA: Diagnosis not present

## 2020-09-06 LAB — GLUCOSE, CAPILLARY
Glucose-Capillary: 132 mg/dL — ABNORMAL HIGH (ref 70–99)
Glucose-Capillary: 120 mg/dL — ABNORMAL HIGH (ref 70–99)
Glucose-Capillary: 159 mg/dL — ABNORMAL HIGH (ref 70–99)
Glucose-Capillary: 113 mg/dL — ABNORMAL HIGH (ref 70–99)

## 2020-09-06 NOTE — Progress Notes (Signed)
Pt noted to have multiple loose stool episodes this shift. Vitals WNL, denies nausea at this time. MD lovorn notified of pt status, and loose stool episodes. Will hold laxatives/stool softners. No new orders noted at this time.

## 2020-09-06 NOTE — Progress Notes (Signed)
PROGRESS NOTE   Subjective/Complaints:   Main issues is feels like will vomit- very nauseated this AM- explained it's likely due to Duloxetine we started last night for his nerve pain- we will stop.   Hasn't asked for any meds for nausea so far- asked nursing to give something.   Added Duloxetine to allergy list.   ROS-  Pt denies SOB, abd pain, CP,and vision changes   Objective:   No results found. No results for input(s): WBC, HGB, HCT, PLT in the last 72 hours. No results for input(s): NA, K, CL, CO2, GLUCOSE, BUN, CREATININE, CALCIUM in the last 72 hours. No intake or output data in the 24 hours ending 09/06/20 1407      Physical Exam: Vital Signs Blood pressure 106/75, pulse 87, temperature 99.4 F (37.4 C), resp. rate 18, height 5\' 8"  (1.727 m), weight 72.6 kg, SpO2 97 %.     General: awake, alert, appropriate, sitting up in bed; appears nauseated- needed vomit bag, NAD HENT: conjugate gaze; oropharynx dry- constantly swallowing to stop from vomiting CV: regular rate; no JVD Pulmonary: CTA B/L; no W/R/R- good air movement GI: soft, TTP diffusely- hyperactive; ND Psychiatric: appropriate- but appears ill Neurological: Ox3- c/o nerve pain  Skin: No evidence of breakdown, no evidence of rash Neurologic: Cranial nerves II through XII intact, motor strength is 4/5 in bilateral deltoid, bicep, tricep, grip, hip flexor, knee extensors, ankle dorsiflexor and plantar flexor Sensory exam normal sensation to light touch and proprioception in bilateral upper and lower extremities Cerebellar exam normal finger to nose to finger as well as heel to shin in bilateral upper and lower extremities Musculoskeletal: Full range of motion in all 4 extremities. No joint swelling    Assessment/Plan: 1. Functional deficits which require 3+ hours per day of interdisciplinary therapy in a comprehensive inpatient rehab  setting.  Physiatrist is providing close team supervision and 24 hour management of active medical problems listed below.  Physiatrist and rehab team continue to assess barriers to discharge/monitor patient progress toward functional and medical goals  Care Tool:  Bathing    Body parts bathed by patient: Face     Body parts n/a: Front perineal area,Buttocks,Right upper leg,Left upper leg (did not attempt)   Bathing assist Assist Level: Supervision/Verbal cueing     Upper Body Dressing/Undressing Upper body dressing   What is the patient wearing?: Hospital gown only    Upper body assist Assist Level: Maximal Assistance - Patient 25 - 49%    Lower Body Dressing/Undressing Lower body dressing    Lower body dressing activity did not occur: Refused       Lower body assist       Toileting Toileting Toileting Activity did not occur (Clothing management and hygiene only): Refused  Toileting assist Assist for toileting: Maximal Assistance - Patient 25 - 49%     Transfers Chair/bed transfer  Transfers assist  Chair/bed transfer activity did not occur: Refused  Chair/bed transfer assist level: Minimal Assistance - Patient > 75% Chair/bed transfer assistive device:   Ambulation assist   Ambulation activity did not occur: Refused  Assist level: Minimal Assistance - Patient >  75% Assistive device: Walker-rolling Max distance: 42ft   Walk 10 feet activity   Assist  Walk 10 feet activity did not occur: Refused  Assist level: Minimal Assistance - Patient > 75% Assistive device: Walker-rolling   Walk 50 feet activity   Assist Walk 50 feet with 2 turns activity did not occur: Safety/medical concerns         Walk 150 feet activity   Assist Walk 150 feet activity did not occur: Safety/medical concerns         Walk 10 feet on uneven surface  activity   Assist Walk 10 feet on uneven surfaces activity did not occur:  Safety/medical concerns         Wheelchair     Assist Will patient use wheelchair at discharge?: No Type of Wheelchair: Manual    Wheelchair assist level: Contact Guard/Touching assist Max wheelchair distance: 100'    Wheelchair 50 feet with 2 turns activity    Assist            Wheelchair 150 feet activity     Assist          Blood pressure 106/75, pulse 87, temperature 99.4 F (37.4 C), resp. rate 18, height 5\' 8"  (1.727 m), weight 72.6 kg, SpO2 97 %.    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 Antithrombotics: -DVT/anticoagulation:Pharmaceutical:Lovenox -antiplatelet therapy: N/A 3. Pain Management:Gabapentin 300mg  TID-improved  , we discussed the process of gradually increasing pain med and that it may take several days, discussed need to participate in PT< OT, even if pain is not under optimal control  2/27- tried adding Duloxetine and increasing Gabapentin to 600 mg BID yesterday- have to stop Duloxetine made ill- will add to allergy list.   4. Mood:LCSW to follow for evaluation and support. -antipsychotic agents: N/A 5. Neuropsych: This patientiscapable of making decisions on hisown behalf. 6. Skin/Wound Care:routine pressure relief measures. 7. Fluids/Electrolytes/Nutrition:Monitor I/O. Check lytes in am. 8. Dental pain: Ibuprofen prn effective.  --Educated on importance of oral care due to recessed gums. --Will order peridex bid 9. T2DM-new diagnosis: Hgb A1c-7.0--will consult dietician to educate on CM diet --Monitor BS ac/hs with SSI for elevated BS CBG (last 3)  Recent Labs    09/05/20 2118 09/06/20 0558 09/06/20 1217  GLUCAP 129* 132* 120*  2/27- BGs 120s-130s- controlled- con't regimen  10. Vitamin B 12 deficiency:  Being supplemented 11. Hyponatremia: Slowly improving from 129-->134 today. Continue to monitor. 12. Leucocytosis: Likely reactive due to fever  steroids. No clear indication for augmentin  --Monitor for fevers and other signs of infection.  2/27- had T of 99.4 Tc=Tm today- will monitor closely and recheck labs in AM  13.  Diarrhea , likely abx associated with d/c and monitor  14.  ? Hx oral pain, no c/o today monitor of abx   LOS: 5 days A FACE TO FACE EVALUATION WAS PERFORMED  Megan Lovorn 09/06/2020, 2:07 PM

## 2020-09-07 DIAGNOSIS — G629 Polyneuropathy, unspecified: Secondary | ICD-10-CM | POA: Diagnosis not present

## 2020-09-07 LAB — BASIC METABOLIC PANEL
Anion gap: 11 (ref 5–15)
BUN: 11 mg/dL (ref 8–23)
CO2: 25 mmol/L (ref 22–32)
Calcium: 8.6 mg/dL — ABNORMAL LOW (ref 8.9–10.3)
Chloride: 100 mmol/L (ref 98–111)
Creatinine, Ser: 0.94 mg/dL (ref 0.61–1.24)
GFR, Estimated: >60 mL/min (ref >60)
Glucose, Bld: 125 mg/dL — ABNORMAL HIGH (ref 70–99)
Potassium: 3.5 mmol/L (ref 3.5–5.1)
Sodium: 136 mmol/L (ref 135–145)

## 2020-09-07 LAB — CBC
HCT: 35.4 % — ABNORMAL LOW (ref 39.0–52.0)
Hemoglobin: 11.4 g/dL — ABNORMAL LOW (ref 13.0–17.0)
MCH: 27.7 pg (ref 26.0–34.0)
MCHC: 32.2 g/dL (ref 30.0–36.0)
MCV: 86.1 fL (ref 80.0–100.0)
Platelets: 287 10*3/uL (ref 150–400)
RBC: 4.11 MIL/uL — ABNORMAL LOW (ref 4.22–5.81)
RDW: 14.1 % (ref 11.5–15.5)
WBC: 9.1 10*3/uL (ref 4.0–10.5)
nRBC: 0 % (ref 0.0–0.2)

## 2020-09-07 LAB — GLUCOSE, CAPILLARY
Glucose-Capillary: 119 mg/dL — ABNORMAL HIGH (ref 70–99)
Glucose-Capillary: 116 mg/dL — ABNORMAL HIGH (ref 70–99)
Glucose-Capillary: 135 mg/dL — ABNORMAL HIGH (ref 70–99)
Glucose-Capillary: 157 mg/dL — ABNORMAL HIGH (ref 70–99)

## 2020-09-07 MED ORDER — GABAPENTIN 400 MG PO CAPS
400.0000 mg | ORAL_CAPSULE | Freq: Three times a day (TID) | ORAL | Status: DC
  Administered 2020-09-07 – 2020-09-09 (×6): 400 mg via ORAL
  Filled 2020-09-07 (×6): qty 1

## 2020-09-07 NOTE — Progress Notes (Signed)
PROGRESS NOTE   Subjective/Complaints: No nausea, feels current nerve pain med is working but may need "a little" more Labs reviewed  ROS-  Pt denies SOB, abd pain, CP,and vision changes   Objective:   No results found. Recent Labs    09/07/20 0440  WBC 9.1  HGB 11.4*  HCT 35.4*  PLT 287   Recent Labs    09/07/20 0440  NA 136  K 3.5  CL 100  CO2 25  GLUCOSE 125*  BUN 11  CREATININE 0.94  CALCIUM 8.6*    Intake/Output Summary (Last 24 hours) at 09/07/2020 0819 Last data filed at 09/07/2020 0720 Gross per 24 hour  Intake 437 ml  Output --  Net 437 ml        Physical Exam: Vital Signs Blood pressure 95/66, pulse 80, temperature 98.6 F (37 C), temperature source Oral, resp. rate 16, height 5\' 8"  (1.727 m), weight 70.1 kg, SpO2 97 %.  General: No acute distress Mood and affect are appropriate Heart: Regular rate and rhythm no rubs murmurs or extra sounds Lungs: Clear to auscultation, breathing unlabored, no rales or wheezes Abdomen: Positive bowel sounds, soft nontender to palpation, nondistended Extremities: No clubbing, cyanosis, or edema  Skin: No evidence of breakdown, no evidence of rash Neurologic: Cranial nerves II through XII intact, motor strength is 4/5 in bilateral deltoid, bicep, tricep, grip, hip flexor, knee extensors, ankle dorsiflexor and plantar flexor Sensory exam normal sensation to light touch and proprioception in bilateral upper and lower extremities Cerebellar exam normal finger to nose to finger as well as heel to shin in bilateral upper and lower extremities Musculoskeletal: Full range of motion in all 4 extremities. No joint swelling    Assessment/Plan: 1. Functional deficits which require 3+ hours per day of interdisciplinary therapy in a comprehensive inpatient rehab setting.  Physiatrist is providing close team supervision and 24 hour management of active medical problems  listed below.  Physiatrist and rehab team continue to assess barriers to discharge/monitor patient progress toward functional and medical goals  Care Tool:  Bathing    Body parts bathed by patient: Face     Body parts n/a: Front perineal area,Buttocks,Right upper leg,Left upper leg (did not attempt)   Bathing assist Assist Level: Supervision/Verbal cueing     Upper Body Dressing/Undressing Upper body dressing   What is the patient wearing?: Hospital gown only    Upper body assist Assist Level: Maximal Assistance - Patient 25 - 49%    Lower Body Dressing/Undressing Lower body dressing    Lower body dressing activity did not occur: Refused       Lower body assist       Toileting Toileting Toileting Activity did not occur (Clothing management and hygiene only): Refused  Toileting assist Assist for toileting: Maximal Assistance - Patient 25 - 49%     Transfers Chair/bed transfer  Transfers assist  Chair/bed transfer activity did not occur: Refused  Chair/bed transfer assist level: Minimal Assistance - Patient > 75% Chair/bed transfer assistive device:   Ambulation assist   Ambulation activity did not occur: Refused  Assist level: Minimal Assistance - Patient > 75% Assistive  device: Walker-rolling Max distance: 76ft   Walk 10 feet activity   Assist  Walk 10 feet activity did not occur: Refused  Assist level: Minimal Assistance - Patient > 75% Assistive device: Walker-rolling   Walk 50 feet activity   Assist Walk 50 feet with 2 turns activity did not occur: Safety/medical concerns         Walk 150 feet activity   Assist Walk 150 feet activity did not occur: Safety/medical concerns         Walk 10 feet on uneven surface  activity   Assist Walk 10 feet on uneven surfaces activity did not occur: Safety/medical concerns         Wheelchair     Assist Will patient use wheelchair at discharge?:  No Type of Wheelchair: Manual    Wheelchair assist level: Contact Guard/Touching assist Max wheelchair distance: 100'    Wheelchair 50 feet with 2 turns activity    Assist            Wheelchair 150 feet activity     Assist          Blood pressure 95/66, pulse 80, temperature 98.6 F (37 C), temperature source Oral, resp. rate 16, height 5\' 8"  (1.727 m), weight 70.1 kg, SpO2 97 %.    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 Antithrombotics: -DVT/anticoagulation:Pharmaceutical:Lovenox -antiplatelet therapy: N/A 3. Pain Management:Gabapentin 300mg  TID-improved  , we discussed the process of gradually increasing pain med and that it may take several days, discussed need to participate in PT< OT, even if pain is not under optimal control  Per Dr 2/27- tried adding Duloxetine and increasing Gabapentin to 600 mg BID yesterday- have to stop Duloxetine made ill- will add to allergy list.   Will change gabapentin to 400mg  TID  4. Mood:LCSW to follow for evaluation and support. -antipsychotic agents: N/A 5. Neuropsych: This patientiscapable of making decisions on hisown behalf. 6. Skin/Wound Care:routine pressure relief measures. 7. Fluids/Electrolytes/Nutrition:Monitor I/O. Check lytes in am. 8. Dental pain: Ibuprofen prn effective.  --Educated on importance of oral care due to recessed gums. --Will order peridex bid 9. T2DM-new diagnosis: Hgb A1c-7.0--will consult dietician to educate on CM diet --Monitor BS ac/hs with SSI for elevated BS CBG (last 3)  Recent Labs    09/06/20 1654 09/06/20 2126 09/07/20 0620  GLUCAP 159* 113* 119*  2/27- BGs 120s-130s- controlled- con't  regimen  10. Vitamin B 12 deficiency: Being supplemented 11. Hyponatremia: Slowly improving from 129-->134 today. Continue to monitor. 12. Leucocytosis: Likely reactive due to fever  steroids. No clear indication for augmentin  --Monitor for fevers and other signs of infection.  2/27- had T of 99.4 Tc=Tm today- will monitor closely and recheck labs in AM  13.  Diarrhea , likely abx associated with d/c and monitor  14.  ? Hx oral pain, no c/o today monitor off abx   LOS: 6 days A FACE TO FACE EVALUATION WAS PERFORMED  09/09/20 09/07/2020, 8:19 AM

## 2020-09-07 NOTE — Progress Notes (Signed)
Physical Therapy Session Note  Patient Details  Name: Jacob Cameron MRN: [id number] Date of Birth: 1955-10-28  Today's Date: 09/07/2020 PT Missed Time: 60 Minutes and 45 mins Missed Time Reason: Patient ill (Comment);Patient unwilling to participate (N/V)  Short Term Goals: Week 1:  PT Short Term Goal 1 (Week 1): Pt will consistently perform sit<>stands using LRAD with min assist PT Short Term Goal 2 (Week 1): Pt will consistently perform bed<>chair transfers using LRAD with min assist PT Short Term Goal 3 (Week 1): Pt will ambulate at least 12ft using LRAD with min assist  Skilled Therapeutic Interventions/Progress Updates:    Session 1: Patient received supine in bed, reporting increased nausea and generally feeling "not well." RN aware and proving patient with rx. However, patient remained unwilling to participate in therapy. He remains in bed, bed alarm on, call light within reach.   Session 2: Patient received sitting up in bed. He continues to report feeling nauseous. Per RN, not due for any rx. Patient refusing to participate in therapy at this time despite max encouragement and education on importance of participating in therapy. He remains in bed, alarm on, call light within reach.   Therapy Documentation Precautions:  Precautions Precautions: Fall Precaution Comments: Translator helpful but pt speaks pretty good English Restrictions Weight Bearing Restrictions: No    Therapy/Group: Individual Therapy  Westley Foots 09/07/2020, 7:43 AM

## 2020-09-07 NOTE — Progress Notes (Signed)
Occupational Therapy Note  Patient Details  Name: Jacob Cameron MRN: [id number] Date of Birth: 10/05/1955  Pt declined participation on OT X2 today in the am and again in the pm.  Tried to persuade pt to sit EOB and just complete selfcare or UE exercises however he declined stating "tomorrow".  He reports pain and tingling in his arms and legs is too much to participate.  Nursing made aware in the am and they stated he already had nausea medicine, and pain medicine post PT attempted session.  Will re-attempt tomorrow.   Bonnell Placzek OTR/L 09/07/2020, 3:37 PM

## 2020-09-08 DIAGNOSIS — G629 Polyneuropathy, unspecified: Secondary | ICD-10-CM | POA: Diagnosis not present

## 2020-09-08 LAB — GLUCOSE, CAPILLARY
Glucose-Capillary: 123 mg/dL — ABNORMAL HIGH (ref 70–99)
Glucose-Capillary: 91 mg/dL (ref 70–99)
Glucose-Capillary: 119 mg/dL — ABNORMAL HIGH (ref 70–99)
Glucose-Capillary: 165 mg/dL — ABNORMAL HIGH (ref 70–99)

## 2020-09-08 MED ORDER — ENSURE ENLIVE PO LIQD: 237.0000 mL | Freq: Two times a day (BID) | ORAL | Status: DC

## 2020-09-08 MED ORDER — ADULT MULTIVITAMIN W/MINERALS CH
1.0000 | ORAL_TABLET | Freq: Every day | ORAL | Status: DC
  Administered 2020-09-08 – 2020-09-18 (×11): 1 via ORAL
  Filled 2020-09-08 (×11): qty 1

## 2020-09-08 MED ORDER — ENSURE ENLIVE PO LIQD
237.0000 mL | Freq: Three times a day (TID) | ORAL | Status: DC
  Administered 2020-09-08 – 2020-09-18 (×25): 237 mL via ORAL
  Filled 2020-09-08 (×8): qty 237

## 2020-09-08 NOTE — Progress Notes (Signed)
Physical Therapy Session Note  Patient Details  Name: Jacob Cameron MRN: [id number] Date of Birth: [birth year]  Today's Date: 09/08/2020 PT Individual Time: 1006-1030 and 8144-8185 PT Individual Time Calculation (min): 24 min and 41 min and  Today's Date: 09/08/2020 PT Missed Time: 19 Minutes Missed Time Reason: Patient fatigue  Short Term Goals: Week 1:  PT Short Term Goal 1 (Week 1): Pt will consistently perform sit<>stands using LRAD with min assist PT Short Term Goal 2 (Week 1): Pt will consistently perform bed<>chair transfers using LRAD with min assist PT Short Term Goal 3 (Week 1): Pt will ambulate at least 75ft using LRAD with min assist  Skilled Therapeutic Interventions/Progress Updates:    Session 1: Pt received sitting in w/c and agreeable to therapy session stating he is feeling better than yesterday.  Transported to/from gym in w/c for time management and energy conservation. Stand pivot w/c>Nustep using RW with CGA for steadying. Performed B UE and B LE reciprocal movement pattern retraining and cardiopulmonary endurance training using Nustep against level 3 increased to level 5 resistance each minute for 3 minutes totaling 152 steps then transitioned to only B LE reciprocal movements for strengthening against level 5 resistance for 50 steps. HR ~95bpm and SpO2 100% during. Gait training ~78ft using RW with min assist for balance - demos improved B LE coordination with decreased ataxic style movements though continues to be slow and now slightly more mechanic/rigid in form. When turning to sit in w/c pt has B LE knee buckle resulting in poorly controlled descent to sit in w/c with therapist assisting to slow movement. Transported back to room and pt requesting to return to bed and rest. L squat pivot w/c>EOB with CGA. Sit>supine with supervision. Pt left supine in bed with needs in reach and bed alarm on.  Session 2: Pt received supine in bed and agreeable to therapy session though  reporting fatigue. Supine>sitting L EOB with supervision. Sitting EOB donned shoes max assist. Sit<>stands during session with min assist for lifting/lowering and pt continuing to demonstrate significant compensation with B UE support. Short distance ~56ft ambulatory transfer to w/c using RW with min assist for balance - continues to demo slow gait speed with wide BOS and increased B LE external rotation - demos improved control when turning to sit down in w/c compared to during AM session.  Transported to/from gym in w/c for time management and energy conservation. Gait training ~48ft using RW with min assist - continuing to demo above gait impairments and continued heavy reliance on B UE support on RW. B LE strengthening at stairs using BUE support on B HRs via repeated L LE step up/down on /off 1st 6" step with min/mod assist for lifting/lowering 2x and then attempted with R LE; however, L LE buckled while placing R foot on step requiring mod assist to recover into standing. Throughout session pt demos significant lethargy often keeping eyes closed. After therapeutic seated rest break performed gait training ~65ft to mat table using RW with min assist and gait as above with continued heavy reliance on B UE support. Initiated sit<>stands to/from significantly elevated mat table with pt continuing to report fatigue and keeping eyes closed. Vitals assessed: BP 95/63 (MAP 74), HR 70bpm L squat pivot EOM>w/c with CGA for steadying. Transported back to room. L squat pivot to bed with min assist for lifting/pivoting hips. Sit>supine with supervision and left supine in bed with needs in reach and bed alarm on. Missed 19 minutes of skilled physical  therapy.  Therapy Documentation Precautions:  Precautions Precautions: Fall Precaution Comments: Translator helpful but pt speaks pretty good English Restrictions Weight Bearing Restrictions: No  Pain:   Session 1: Continues to report some "tingling" pain in B UEs and  LEs, unrated - premedicated.   Session 2: No true complaint of pain just repeated that his "feet" felt "heavy" and had a lot of "weakness."    Therapy/Group: Individual Therapy  Ginny Forth , PT, DPT, CSRS  09/08/2020, 7:56 AM

## 2020-09-08 NOTE — Progress Notes (Signed)
PROGRESS NOTE   Subjective/Complaints: Refused OT yesterday due to pain but feels better today  ROS-  Pt denies SOB, abd pain, CP,and vision changes   Objective:   No results found. Recent Labs    09/07/20 0440  WBC 9.1  HGB 11.4*  HCT 35.4*  PLT 287   Recent Labs    09/07/20 0440  NA 136  K 3.5  CL 100  CO2 25  GLUCOSE 125*  BUN 11  CREATININE 0.94  CALCIUM 8.6*    Intake/Output Summary (Last 24 hours) at 09/08/2020 0838 Last data filed at 09/08/2020 0807 Gross per 24 hour  Intake 440 ml  Output -  Net 440 ml        Physical Exam: Vital Signs Blood pressure 94/60, pulse 78, temperature 98.2 F (36.8 C), resp. rate 18, height 5\' 8"  (1.727 m), weight 71.1 kg, SpO2 98 %.  General: No acute distress Mood and affect are appropriate Heart: Regular rate and rhythm no rubs murmurs or extra sounds Lungs: Clear to auscultation, breathing unlabored, no rales or wheezes Abdomen: Positive bowel sounds, soft nontender to palpation, nondistended Extremities: No clubbing, cyanosis, or edema  Skin: No evidence of breakdown, no evidence of rash Neurologic: Cranial nerves II through XII intact, motor strength is 4/5 in bilateral deltoid, bicep, tricep, grip, hip flexor, knee extensors, ankle dorsiflexor and plantar flexor Sensory exam normal sensation to light touch and proprioception in bilateral upper and lower extremities Cerebellar exam normal finger to nose to finger as well as heel to shin in bilateral upper and lower extremities Musculoskeletal: Full range of motion in all 4 extremities. No joint swelling    Assessment/Plan: 1. Functional deficits which require 3+ hours per day of interdisciplinary therapy in a comprehensive inpatient rehab setting.  Physiatrist is providing close team supervision and 24 hour management of active medical problems listed below.  Physiatrist and rehab team continue to assess  barriers to discharge/monitor patient progress toward functional and medical goals  Care Tool:  Bathing    Body parts bathed by patient: Face     Body parts n/a: Front perineal area,Buttocks,Right upper leg,Left upper leg (did not attempt)   Bathing assist Assist Level: Supervision/Verbal cueing     Upper Body Dressing/Undressing Upper body dressing   What is the patient wearing?: Hospital gown only    Upper body assist Assist Level: Maximal Assistance - Patient 25 - 49%    Lower Body Dressing/Undressing Lower body dressing    Lower body dressing activity did not occur: Refused       Lower body assist       Toileting Toileting Toileting Activity did not occur (Clothing management and hygiene only): Refused  Toileting assist Assist for toileting: Maximal Assistance - Patient 25 - 49%     Transfers Chair/bed transfer  Transfers assist  Chair/bed transfer activity did not occur: Refused  Chair/bed transfer assist level: Minimal Assistance - Patient > 75% Chair/bed transfer assistive device:   Ambulation assist   Ambulation activity did not occur: Refused  Assist level: Minimal Assistance - Patient > 75% Assistive device: Walker-rolling Max distance: 11ft   Walk 10 feet  activity   Assist  Walk 10 feet activity did not occur: Refused  Assist level: Minimal Assistance - Patient > 75% Assistive device: Walker-rolling   Walk 50 feet activity   Assist Walk 50 feet with 2 turns activity did not occur: Safety/medical concerns         Walk 150 feet activity   Assist Walk 150 feet activity did not occur: Safety/medical concerns         Walk 10 feet on uneven surface  activity   Assist Walk 10 feet on uneven surfaces activity did not occur: Safety/medical concerns         Wheelchair     Assist Will patient use wheelchair at discharge?: No Type of Wheelchair: Manual    Wheelchair assist level:  Contact Guard/Touching assist Max wheelchair distance: 100'    Wheelchair 50 feet with 2 turns activity    Assist            Wheelchair 150 feet activity     Assist          Blood pressure 94/60, pulse 78, temperature 98.2 F (36.8 C), resp. rate 18, height 5\' 8"  (1.727 m), weight 71.1 kg, SpO2 98 %.    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 Antithrombotics: -DVT/anticoagulation:Pharmaceutical:Lovenox -antiplatelet therapy: N/A 3. Pain Management:Gabapentin 300mg  TID-improved  , we discussed the process of gradually increasing pain med and that it may take several days, discussed need to participate in PT< OT, even if pain is not under optimal control  Per Dr 2/27- tried adding Duloxetine and increasing Gabapentin to 600 mg BID yesterday- have to stop Duloxetine made ill- will add to allergy list.   Will change gabapentin to 400mg  TID  4. Mood:LCSW to follow for evaluation and support. -antipsychotic agents: N/A 5. Neuropsych: This patientiscapable of making decisions on hisown behalf. 6. Skin/Wound Care:routine pressure relief measures. 7. Fluids/Electrolytes/Nutrition:Monitor I/O intake limited but BUN and creat nl , d/c IV 8. Dental pain:resolved --Educated on importance of oral care due to recessed gums. --Will order peridex bid 9. T2DM-new diagnosis: Hgb A1c-7.0--will consult dietician to educate on CM diet --Monitor BS ac/hs with SSI for elevated BS CBG (last 3)  Recent Labs    09/07/20 1611 09/07/20 2128 09/08/20 0532  GLUCAP 135* 157* 123*  3/1- BGs 120s-150s- controlled- con't regimen  10. Vitamin B 12 deficiency: Being supplemented 11. Hyponatremia: Slowly improving from 129-->134 today.  Continue to monitor. 12. Leucocytosis: Likely reactive due to fever  steroids. No clear indication for augmentin  --Monitor for fevers and other signs of infection.  2/27- had T of 99.4 Tc=Tm today- will monitor closely and recheck labs in AM  13.  Diarrhea , likely abx associated with d/c and monitor     LOS: 7 days A FACE TO FACE EVALUATION WAS PERFORMED  2129 09/08/2020, 8:38 AM

## 2020-09-08 NOTE — Progress Notes (Signed)
On evening 8 pm med pass patient was verbally aggressive about his breakfast order patient states that " you know my order but you keep brining in the wrong food." Patient educated about meal delivery process and the nessecity to call down to the kitchen to make custom orders. Patient also educated that orders are not saved after being delivered and he needs to call for every custom order. Patient then began to speak in his native language and this nurse gave medication per mar and left the room.

## 2020-09-08 NOTE — Progress Notes (Signed)
Initial Nutrition Assessment  RD working remotely.  DOCUMENTATION CODES:   Not applicable  INTERVENTION:   - Diabetes diet education provided previously (see note from 09/02/20 for details)  - Change diet order to Vegetarian so that pt has more options that align with his preferences  - MVI with minerals daily  - Ensure Enlive po TID, each supplement provides 350 kcal and 20 grams of protein  - Encourage adequate PO intake  NUTRITION DIAGNOSIS:   Increased nutrient needs related to other (extensive therapies) as evidenced by estimated needs.  GOAL:   Patient will meet greater than or equal to 90% of their needs  MONITOR:   PO intake,Supplement acceptance,Weight trends,Labs  REASON FOR ASSESSMENT:   Other (RN reports pt having difficulty finding foods to eat)    ASSESSMENT:   65 year old male admitted on 08/12/20 with bilateral lower extremity and right upper extremity paresthesias with shooting pains with BLE weakness. Pt felt to have GBS and was treated with IVIG 5/5 with some improvement in LE paraesthesias of symptoms. Pt continued to have issues with pain, nausea, and progressive symptoms therefore plasmapheresis recommended and patient/family agreeable. Dialysis catheter placed on 2/9 and pt completed 3 doses with minimal improvement. High dose IV steroids added 2/17 due to suspicion of GBS variant with 2 additional doses of PLEX. Admitted to CIR on 09/01/20.   RN reached out to RD regarding pt having difficulty with food choices on Heart Healthy/Carb Modified diet because he is a vegetarian. Per RN, pt only eating fish, green beans, and [place] fries. RD changed diet order to Vegetarian and continued with 1800 ml fluid restriction from previous diet order. RN reports pt enjoys Ensure supplements so RD will order these to aid pt in meeting kcal and protein needs.  RD completed diet education with pt on 2/23. At that time pt reported having a good appetite and eating well  PTA.  Noted pt with complaints of nausea over the weekend. Per MD note, likely related to new medication which was discontinued. Nausea has improved.  Reviewed weight history in chart. No weight available prior to recent acute admission. However, pt has experienced a 9.2 kg weight loss since 08/10/20. This is an 11.5% weight loss in 1 month which is severe and significant for timeframe. Pt is at risk for malnutrition. RD unable to confirm without NFPE. Will complete at follow-up.  RD will also order daily MVI due to variable PO intake.  Meal Completion: 15-100%  Medications reviewed and include: vitamin B-12 injection weekly, SSI, protonix, miralax  Labs reviewed. CBG's: 91-157 x 24 hours  NUTRITION - FOCUSED PHYSICAL EXAM:  Unable to complete at this time. RD working remotely.  Diet Order:   Diet Order            Diet vegetarian Room service appropriate? Yes; Fluid consistency: Thin; Fluid restriction: 1800 mL Fluid  Diet effective now                 EDUCATION NEEDS:   Education needs have been addressed  Skin:  Skin Assessment: Reviewed RN Assessment  Last BM:  09/08/20 large type 7  Height:   Ht Readings from Last 1 Encounters:  09/01/20 5\' 8"  (1.727 m)    Weight:   Wt Readings from Last 1 Encounters:  09/08/20 71.1 kg    BMI:  Body mass index is 23.83 kg/m.  Estimated Nutritional Needs:   Kcal:  2000-2200  Protein:  100-115 grams  Fluid:  2.0-2.2 L  Gustavus Bryant, MS, RD, LDN Inpatient Clinical Dietitian Please see [facility] for contact information.

## 2020-09-08 NOTE — Progress Notes (Addendum)
Occupational Therapy Session Note  Patient Details  Name: Jacob Cameron MRN: [id number] Date of Birth: [birth year]  Today's Date: 09/08/2020 OT Individual Time: 0800-0903 OT Individual Time Calculation (min): 63 min    Short Term Goals: Week 1:  OT Short Term Goal 1 (Week 1): patient will tolerate change of position and sitting in w/c 2+ hours OT Short Term Goal 2 (Week 1): patient will complete bed mobility and functional transfers with min A OT Short Term Goal 3 (Week 1): patient will complete bathing and grooming tasks w/c level with min a OT Short Term Goal 4 (Week 1): patient will complete toileting with min A  Skilled Therapeutic Interventions/Progress Updates:    Session 1: (0973-5329) Pt in bed to start agreeable to complete transfer to the EOB with supervision.  Pt declined bathing, stating nursing assisted him earlier.  Therapist did provide new paper scrub top for wearing however and he donned with supervision.  Max instructional cueing needed for him to try and donn and wear his shoes.  Pt with increased pain and coldness reported in his feet during previous sessions, so wanted to try shoes to see if it helped with his pain during standing and mobility.  He eventually agreed to try and donn them.  Mod assist was needed for donning and tying his shoes.  He was then able to complete transfer stand pivot to the wheelchair with min assist with use of the RW.  He was then able to roll himself down to the therapy gym with supervision for transfer to the therapy mat.  Had pt work on BUE strengthening in sitting and standing with use of resistive clothespins.  He was able to use the RUE for completion of yellow and red clothespins using a gross grasp and lateral pinch.  Increased difficulty was noted when attempting to press and place the green clothespins.  He was able to complete gross grasp for the green clothespins with the LUE however.  In standing he needed min assist for balance secondary to  posterior LOB and decreased hip extension.  He him work on stand to squat with use of the walker as well and elevated mat.  He was able to complete with min assist for 2 sets of 5 repetitions.  Finished session with return to the room with supervision pushing his wheelchair.  He was left with the call button and phone in reach and safety belt in place.    Session 2: ([id number]) Pt in bed to start session.  Had him complete transfer to the EOB with supervision for work on bilateral strengthening with use of therapy putty.  He was able to use BUEs for several repetitions of gross digit flexion, intrinsic flexion, and lateral pinch with both UEs using the medium resistance therapy putty.  Min instructional cueing was needed for proper technique with all exercises.  Increased weakness was noted to be more prominent in the digit flexors on the right hand compared to the left.  Finished session with pt transitioning back to supine with supervision and therapist assisting him with scooting up to the [facility] at min assist.  Call button and phone in reach with safety alarm in place.    Therapy Documentation Precautions:  Precautions Precautions: Fall Precaution Comments: Translator helpful but pt speaks pretty good English Restrictions Weight Bearing Restrictions: No   Pain: Pain Assessment Pain Scale: 0-10 Pain Score: 4  Pain Type: Neuropathic pain Pain Location: Hand Pain Orientation: Left;Right Pain Descriptors / Indicators: Burning Pain  Onset: With Activity Pain Intervention(s): Repositioned ADL: See Care Tool Section for some details of mobility and selfcare  Therapy/Group: Individual Therapy  Jalen Oberry OTR/L  09/08/2020, 9:16 AM

## 2020-09-09 DIAGNOSIS — G629 Polyneuropathy, unspecified: Secondary | ICD-10-CM | POA: Diagnosis not present

## 2020-09-09 LAB — GLUCOSE, CAPILLARY
Glucose-Capillary: 121 mg/dL — ABNORMAL HIGH (ref 70–99)
Glucose-Capillary: 126 mg/dL — ABNORMAL HIGH (ref 70–99)
Glucose-Capillary: 140 mg/dL — ABNORMAL HIGH (ref 70–99)
Glucose-Capillary: 165 mg/dL — ABNORMAL HIGH (ref 70–99)

## 2020-09-09 MED ORDER — GABAPENTIN 400 MG PO CAPS
400.0000 mg | ORAL_CAPSULE | Freq: Three times a day (TID) | ORAL | Status: DC
  Administered 2020-09-09 – 2020-09-18 (×37): 400 mg via ORAL
  Filled 2020-09-09 (×37): qty 1

## 2020-09-09 NOTE — Progress Notes (Signed)
Patient ID: Jacob Cameron, male   DOB: August 22, 1955, 65 y.o.   MRN: [id number]  Family education scheduled 3/7 and 3/8 1-3 PM.  Lavera Guise, [place] [phone]

## 2020-09-09 NOTE — Progress Notes (Signed)
PROGRESS NOTE   Subjective/Complaints: Some issues with meal order yesterday , refuses therapy at times, became angry at LPN about meal order  ROS-  Pt denies SOB, abd pain, CP,and vision changes   Objective:   No results found. Recent Labs    09/07/20 0440  WBC 9.1  HGB 11.4*  HCT 35.4*  PLT 287   Recent Labs    09/07/20 0440  NA 136  K 3.5  CL 100  CO2 25  GLUCOSE 125*  BUN 11  CREATININE 0.94  CALCIUM 8.6*    Intake/Output Summary (Last 24 hours) at 09/09/2020 0841 Last data filed at 09/09/2020 0738 Gross per 24 hour  Intake 500 ml  Output 200 ml  Net 300 ml        Physical Exam: Vital Signs Blood pressure (!) 87/64, pulse 84, temperature 98.1 F (36.7 C), resp. rate 18, height _0  (1.727 m), weight 71.4 kg, SpO2 97 %.  General: No acute distress Mood and affect are appropriate Heart: Regular rate and rhythm no rubs murmurs or extra sounds Lungs: Clear to auscultation, breathing unlabored, no rales or wheezes Abdomen: Positive bowel sounds, soft nontender to palpation, nondistended Extremities: No clubbing, cyanosis, or edema  Skin: No evidence of breakdown, no evidence of rash Neurologic: Cranial nerves II through XII intact, motor strength is 4/5 in bilateral deltoid, bicep, tricep, grip, hip flexor, knee extensors, ankle dorsiflexor and plantar flexor Sensory exam normal sensation to light touch and proprioception in bilateral upper and lower extremities Cerebellar exam normal finger to nose to finger as well as heel to shin in bilateral upper and lower extremities Musculoskeletal: Full range of motion in all 4 extremities. No joint swelling    Assessment/Plan: 1. Functional deficits which require 3+ hours per day of interdisciplinary therapy in a comprehensive inpatient rehab setting.  Physiatrist is providing close team supervision and 24 hour management of active medical problems listed  below.  Physiatrist and rehab team continue to assess barriers to discharge/monitor patient progress toward functional and medical goals  Care Tool:  Bathing    Body parts bathed by patient: Face     Body parts n/a: Front perineal area,Buttocks,Right upper leg,Left upper leg (did not attempt)   Bathing assist Assist Level: Supervision/Verbal cueing     Upper Body Dressing/Undressing Upper body dressing   What is the patient wearing?: Pull over shirt    Upper body assist Assist Level: Supervision/Verbal cueing    Lower Body Dressing/Undressing Lower body dressing    Lower body dressing activity did not occur: Refused       Lower body assist       Toileting Toileting Toileting Activity did not occur (Clothing management and hygiene only): Refused  Toileting assist Assist for toileting: Maximal Assistance - Patient 25 - 49%     Transfers Chair/bed transfer  Transfers assist  Chair/bed transfer activity did not occur: Refused  Chair/bed transfer assist level: Minimal Assistance - Patient > 75% Chair/bed transfer assistive device: Programmer, multimedia   Ambulation assist   Ambulation activity did not occur: Refused  Assist level: Minimal Assistance - Patient > 75% Assistive device: Walker-rolling Max distance: 22f  Walk 10 feet activity   Assist  Walk 10 feet activity did not occur: Refused  Assist level: Minimal Assistance - Patient > 75% Assistive device: Walker-rolling   Walk 50 feet activity   Assist Walk 50 feet with 2 turns activity did not occur: Safety/medical concerns         Walk 150 feet activity   Assist Walk 150 feet activity did not occur: Safety/medical concerns         Walk 10 feet on uneven surface  activity   Assist Walk 10 feet on uneven surfaces activity did not occur: Safety/medical concerns         Wheelchair     Assist Will patient use wheelchair at discharge?: No Type of Wheelchair:  Manual    Wheelchair assist level: Contact Guard/Touching assist Max wheelchair distance: 100'    Wheelchair 50 feet with 2 turns activity    Assist            Wheelchair 150 feet activity     Assist          Blood pressure (!) 87/64, pulse 84, temperature 98.1 F (36.7 C), resp. rate 18, height _0  (1.727 m), weight 71.4 kg, SpO2 97 %.    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 Formulized individual treatment plan based on medical history, underlying problem and comorbidities.  2. Antithrombotics: -DVT/anticoagulation:Pharmaceutical:Lovenox -antiplatelet therapy: N/A 3. Pain Management:Gabapentin 324m TID-improved  , we discussed the process of gradually increasing pain med and that it may take several days, discussed need to participate in PT< OT, even if pain is not under optimal control  Per Dr LDagoberto Ligas2/27- tried adding Duloxetine and increasing Gabapentin to 600 mg BID yesterday- have to stop Duloxetine made ill- will add to allergy list.   Will change gabapentin to 405mTID  4. Mood:LCSW to follow for evaluation and support. -antipsychotic agents: N/A 5. Neuropsych: This patientiscapable of making decisions on hisown behalf. 6. Skin/Wound Care:routine pressure relief measures. 7. Fluids/Electrolytes/Nutrition:Monitor I/O intake limited but BUN and creat nl , d/c IV 8. Dental pain:resolved --Educated on importance of oral care due to recessed gums. --Will order peridex bid 9. T2DM-new diagnosis: Hgb A1c-7.0--will consult dietician to educate on CM diet --Monitor BS ac/hs with SSI for  elevated BS CBG (last 3)  Recent Labs    09/08/20 1648 09/08/20 2139 09/09/20 0540  GLUCAP 119* 165* 121*  3/2-controlled   10. Vitamin B 12 deficiency: Being supplemented 11. Hyponatremia: Slowly improving from 129-->134 today. Continue to monitor. 12. Leucocytosis: Likely reactive due to fever  steroids. No clear indication for augmentin  --Monitor for fevers and other signs of infection.  2/27- had T of 99.4 Tc=Tm today- will monitor closely and recheck labs in AM  13.  Diarrhea , likely abx associated with d/c and monitor     LOS: 8 days A FACE TO FACE EVALUATION WAS PERFORMED  AnCharlett Blake/08/2020, 8:41 AM

## 2020-09-09 NOTE — Progress Notes (Signed)
Occupational Therapy Weekly Progress Note  Patient Details  Name: Jacob Cameron MRN: [id number] Date of Birth: [birth year]  Beginning of progress report period: September 02, 2020 End of progress report period: September 09, 2020  Today's Date: 09/09/2020 OT Individual Time: 0802-0901 OT Individual Time Calculation (min): 59 min    Patient has met 3 of 4 short term goals.  Jacob Cameron is making steady progress with OT but has demonstrated some limited participation secondary to report of pain and nausea at times.  Increased pain is still present in the arms and legs but recently has been at a 4-5/10.  He is able to currently complete UB selfcare with min assist for LB bathing sit to stand.  LB dressing is at a mod assist with increased difficulty donning his gripper socks and shoes secondary to bilateral hand weakness at times.  He continues to demonstrate increased weakness distally in the hands bilaterally with the RUE being more weaker and demonstrating decreased FM coordination.  Transfers are currently min assist with the RW with overall decreased endurance.  Feel he is on target for established LTGs set at supervision level overall.  Recommend continued CIR level to achieve these goals.    Patient continues to demonstrate the following deficits: muscle weakness and muscle paralysis, impaired timing and sequencing, unbalanced muscle activation and decreased coordination and decreased standing balance, decreased postural control and decreased balance strategies and therefore will continue to benefit from skilled OT intervention to enhance overall performance with BADL and Reduce care partner burden.  Patient progressing toward long term goals..  Continue plan of care.  OT Short Term Goals Week 2:  OT Short Term Goal 1 (Week 2): patient will complete toileting with min A OT Short Term Goal 2 (Week 2): Pt will donn his shoes and socks with supervision. OT Short Term Goal 3 (Week 2): Pt will complete toilet  transfer with min guard assist using the RW for support. OT Short Term Goal 4 (Week 2): Pt will stand at the sink for 2 mins with close supervision for completion of 1-2 grooming tasks.  Skilled Therapeutic Interventions/Progress Updates:    Pt worked on bathing and dressing during session.  He was able to transfer to the EOB with supervision and then ambulated to the shower with min assist using the RW.  He was able to remove all UB clothing with supervision and then completed UB bathing with supervision as well.  He needed mod assist for removal of LB clothing including pants and gripper socks.  He was able to complete LB bathing with min assist in sitting with lateral leans.  Once he dried off, he was able to complete transfer to the wheelchair at min assist level.  He completed donning his pullover shirt with supervision.  Pants were donned with min assist sit to stand with mod assist for gripper socks and shoes.  He was able to complete grooming task of oral hygiene in sitting to complete session.  Call button and phone were in place with pt awaiting for PT session next.     Therapy Documentation Precautions:  Precautions Precautions: Fall Precaution Comments: Translator helpful but pt speaks pretty good English Restrictions Weight Bearing Restrictions: No  Pain: Pain Assessment Pain Scale: 0-10 Pain Score: 4  Pain Type: Neuropathic pain Pain Location: Leg Pain Orientation: Left;Right Pain Descriptors / Indicators: Burning Pain Onset: With Activity Pain Intervention(s): Repositioned Multiple Pain Sites: No ADL: See Care Tool Section for some details of mobility and selfcare  Therapy/Group: Individual Therapy  Dolton Shaker OTR/L 09/09/2020, 10:32 AM

## 2020-09-09 NOTE — Progress Notes (Signed)
Physical Therapy Weekly Progress Note  Patient Details  Name: Jacob Cameron MRN: [id number] Date of Birth: 1956-05-30  Beginning of progress report period: September 02, 2020 End of progress report period: September 09, 2020   Patient has met 3 of 3 short term goals.  Jacob Cameron is making slow, steady progress with therapy demonstrating increasing independence with functional mobility. He is performing supine<>sit with supervision, sit<>stand and stand pivot transfers using RW with min assist, and ambulating up to ~68ft using RW with min assist. He continues to demonstrates significant B LE weakness relying heavily on B UE support on RW for transfers, balance in standing, and gait. His participation in therapy has varied due to significantly impaired endurance with resultant fatigue. Jacob Cameron will benefit from continued CIR level therapies to further progress his functional mobility level.  Patient continues to demonstrate the following deficits muscle weakness, decreased cardiorespiratoy endurance and decreased standing balance, decreased postural control and decreased balance strategies and therefore will continue to benefit from skilled PT intervention to increase functional independence with mobility.  Patient progressing toward long term goals..  Continue plan of care.  PT Short Term Goals Week 1:  PT Short Term Goal 1 (Week 1): Pt will consistently perform sit<>stands using LRAD with min assist PT Short Term Goal 1 - Progress (Week 1): Met PT Short Term Goal 2 (Week 1): Pt will consistently perform bed<>chair transfers using LRAD with min assist PT Short Term Goal 2 - Progress (Week 1): Met PT Short Term Goal 3 (Week 1): Pt will ambulate at least 41ft using LRAD with min assist PT Short Term Goal 3 - Progress (Week 1): Met Week 2:  PT Short Term Goal 1 (Week 2): = to LTGs based on ELOS  Skilled Therapeutic Interventions/Progress Updates:  Ambulation/gait training;Community  reintegration;DME/adaptive equipment instruction;Neuromuscular re-education;Psychosocial support;Stair training;UE/LE Strength taining/ROM;Training and development officer;Wheelchair propulsion/positioning;Discharge planning;Functional electrical stimulation;Pain management;Skin care/wound management;Therapeutic Activities;UE/LE Coordination activities;Cognitive remediation/compensation;Disease management/prevention;Patient/family education;Splinting/orthotics;Functional mobility training;Therapeutic Exercise;Visual/perceptual remediation/compensation   Therapy Documentation Precautions:  Precautions Precautions: Fall Precaution Comments: Translator helpful but pt speaks pretty good English Restrictions Weight Bearing Restrictions: No   Tawana Scale , PT, DPT, CSRS  09/09/2020, 7:58 AM

## 2020-09-09 NOTE — Progress Notes (Signed)
Patient ID: Jacob Cameron, male   DOB: Aug 04, 1955, 65 y.o.   MRN: [id number] Team Conference Report to Patient/Family  Team Conference discussion was reviewed with the patient and caregiver, including goals, any changes in plan of care and target discharge date.  Patient and caregiver express understanding and are in agreement.  The patient has a target discharge date of 09/18/20.  Andria Rhein 09/09/2020, 1:41 PM

## 2020-09-09 NOTE — Progress Notes (Signed)
Physical Therapy Session Note  Patient Details  Name: Jacob Cameron MRN: [id number] Date of Birth: [birth year]  Today's Date: 09/09/2020 PT Individual Time: 0905-1021 PT Individual Time Calculation (min): 76 min   and  Today's Date: 09/09/2020 PT Missed Time: 14 Minutes and 30 minutes Missed Time Reason: Patient fatigue  Short Term Goals: Week 2:  PT Short Term Goal 1 (Week 2): = to LTGs based on ELOS  Skilled Therapeutic Interventions/Progress Updates:    Session 1: Pt received sitting in w/c and agreeable to therapy session.  Transported to/from gym in w/c for time management and energy conservation. Sit<>stands using RW to/from w/c and EOM with CGA/min assist for lifting/lowering - demos improved eccentric control to sit down. Gait training ~20ft using RW x4 reps with CGA/min assist for balance each time - continues to demo mildly impaired B LE coordination during swing (R>L) with hard landing on R LE, wider BOS with continued increased hip external rotation, decreased heel strike, and significant support/WBing through B UEs on RW. Transported to ADL apartment. Discussed D/C plan with goals of reaching household ambulation. Gait training 3x in ADL apartment ~9ft each using RW with CGA/min assist for balance - continues to demo above gait impairments with no increased deviations on carpet compared to hard flooring. During this task HR 102bpm. Sit<>stands to/from significantly elevated EOM with B HHA to decrease UE support with min assist for lifting/lowering and pt demonstrating decreased use of UEs compared to last time performing this exercise with this therapist. Started static standing balance task working on decreased B UE support with pt reporting lightheadedness. Returned to sitting and assessed vitals: BP 92/64 (MAP 74), HR 99bpm, returned to standing and reassessed: BP 97/62 (MAP 74), HR 117bpm with pt becoming fatigued during static standing with only LUE support. Standing tolerance and balance  task performing card matching game with L UE support on RW progressed to L HHA to further challenge standing balance with pt tolerating standing ~36min each time - min assist for balance due to mild anterior/posterior sway. Standing balance and B LE strengthening task with B UE support on RW via alternate BLE foot taps on 4" step - pt demonstrates significant compensation with trunk flexion and WBing down through arms. Attempted to transition to only L HHA and R LE forward/backwards stepping to decrease compensation with UEs - pt becomes very fearful of falling stating "I can't" although agreeable to attempt but remains incredibly fearful therefore discontinued task. Pt states that he is "complete exhaustion" and requests to return to room. Transported back to room. R squat pivot w/c>EOB with CGA for safety. Sit>supine supervision. Pt left supine in bed with needs in reach and bed alarm on.  Missed 14 minutes of skilled physical therapy.  Session 2: Pt received asleep R sidelying in bed and upon awakening pt states he is "exhausted." Pt politely requests to rest for the rest of the day. Pt left sidelying in bed with needs in reach and bed alarm on. Missed 30 minutes of skilled physical therapy.  Therapy Documentation Precautions:  Precautions Precautions: Fall Precaution Comments: Translator helpful but pt speaks pretty good English Restrictions Weight Bearing Restrictions: No  Pain: Session 1: Reports feeling pain in legs mostly described as "tingling" - does not limit participation in therapy session.    Therapy/Group: Individual Therapy  Ginny Forth , PT, DPT, CSRS  09/09/2020, 7:57 AM

## 2020-09-09 NOTE — Patient Care Conference (Signed)
Inpatient RehabilitationTeam Conference and Plan of Care Update Date: 09/09/2020   Time: 10:43 AM    Patient Name: Jacob Cameron      Medical Record Number: [id number]  Date of Birth: [birth year] Sex: Male         Room/Bed: 4W16C/4W16C-01 Payor Info: Payor: MEDICARE / Plan: MEDICARE PART A AND B / Product Type: *No Product type* /    Admit Date/Time:  09/01/2020  4:08 PM  Primary Diagnosis:  Axonal polyneuropathy  Hospital Problems: Principal Problem:   Axonal polyneuropathy    Expected Discharge Date: Expected Discharge Date: 09/18/20  Team Members Present: Physician leading conference: Dr. Claudette Laws Care Coodinator Present: Chana Bode, RN, BSN, CRRN;Christina Vita Barley, BSW Nurse Present: Margot Ables, LPN PT Present: Casimiro Needle, PT OT Present: Perrin Maltese, OT PPS Coordinator present : Fae Pippin, [place], PT     Current Status/Progress Goal Weekly Team Focus  Bowel/Bladder   pt conitnent of bowel and bladder lbm 3/2  Remain continent B/B  PRN toileting   Swallow/Nutrition/ Hydration             ADL's   Supervision for UB selfcare, min assist for LB bathing sit to stand.  Min to mod for LB dressing.  Increased pain in his legs and hands limiting therapy participation at times.  Min assist for transfers with the RW as well.  supervision overall  selfcare retraining, transfer training,  DME education, balance retraining, therapeutic exercise, pt/family education   Mobility   supervision bed mobility, min assist sit<>stand and stand pivot transfers using RW, min assist gait up to 41ft using RW with pt demonstrating poor B LE coordination with impaired gait mechanics; demonstrates continued significant BLE weakness; significantly impaired activity tolerance limiting participation in therapy sessions  supervision overall  B LE strengthening, activity tolerance, transfer training, gait training, DME education/training, pt education, standing balance and  tolerance, therapeutic exercise   Communication             Safety/Cognition/ Behavioral Observations            Pain   9/10 pt reported increased pain of numbness and tingling over the past few shifts  less than 3 out of 10  Assess qshift and PRN   Skin   mild brusing to RUE ij site in dressing  No skin breakdown this admission  assess skin Qshift and PRN     Discharge Planning:  Goal to discharge home with 2 sons and spouse to provide care 24/7   Team Discussion: Sensory dysesthesias post GBS. Patient intermittently refusing therapy due to pain issues. Pain addressed per MD and note some improvement with participation with therapy sessions and patient refusing to flex knees. Autonomic instability associated with GBS addressed. Patient compensates a lot and is fearful. Progress limited by weakness, sensory impairments and in-coordination but able to ambulate 40' with min assist.  Patient on target to meet rehab goals: yes, supervision goals set for discharge  *See Care Plan and progress notes for long and short-term goals.   Revisions to Treatment Plan:   Teaching Needs: Transfers, toileting, medications, etc.   Current Barriers to Discharge: Decreased caregiver support and Behavior  Possible Resolutions to Barriers: Wife able to provide supervision, sons will provide physical assist at discharge Family education set up     Medical Summary Current Status: tingling pain, ankle weakness  Barriers to Discharge: Medical stability   Possible Resolutions to Barriers/Weekly Focus: adjust gabapentin, family ed   Continued Need for Acute  Rehabilitation Level of Care: The patient requires daily medical management by a physician with specialized training in physical medicine and rehabilitation for the following reasons: Direction of a multidisciplinary physical rehabilitation program to maximize functional independence : Yes Medical management of patient stability for increased  activity during participation in an intensive rehabilitation regime.: Yes Analysis of laboratory values and/or radiology reports with any subsequent need for medication adjustment and/or medical intervention. : Yes   I attest that I was present, lead the team conference, and concur with the assessment and plan of the team.   Chana Bode B 09/09/2020, 3:27 PM

## 2020-09-10 LAB — GLUCOSE, CAPILLARY
Glucose-Capillary: 166 mg/dL — ABNORMAL HIGH (ref 70–99)
Glucose-Capillary: 111 mg/dL — ABNORMAL HIGH (ref 70–99)
Glucose-Capillary: 108 mg/dL — ABNORMAL HIGH (ref 70–99)
Glucose-Capillary: 150 mg/dL — ABNORMAL HIGH (ref 70–99)

## 2020-09-10 NOTE — Progress Notes (Signed)
Occupational Therapy Session Note  Patient Details  Name: Jacob Cameron MRN: [id number] Date of Birth: 29-Apr-1956  Today's Date: 09/10/2020 OT Individual Time: 1400-1500 OT Individual Time Calculation (min): 60 min    Short Term Goals: Week 2:  OT Short Term Goal 1 (Week 2): patient will complete toileting with min A OT Short Term Goal 2 (Week 2): Pt will donn his shoes and socks with supervision. OT Short Term Goal 3 (Week 2): Pt will complete toilet transfer with min guard assist using the RW for support. OT Short Term Goal 4 (Week 2): Pt will stand at the sink for 2 mins with close supervision for completion of 1-2 grooming tasks.  Skilled Therapeutic Interventions/Progress Updates:    Pt reporting 6/10 pain in BLE requesting pain medication; nurse made aware and medication administered at start of session.  Pt agreeable to washing up at sink.  Sit to stand at [facility] and ambulation of approximately 5 feet EOB to sinkside with close supervision.  Pt completed stand to sit with CGA.  Pt completed oral care, combed hair, washed face, and applied powder and deodorant with setup seated in w/c.  Pt reports he does not need to wash up anything else at this time, but requesting clean shirt.  Pt doffed/donned paper scrub shirt overhead with setup.    Pt reports eating with his right hand is very difficult due to weakness and tingling/pain and has been eating with his left hand but does not like to because it is his non-dominant hand.  Assessed strength in bilateral hands noting the following: Bilateral hands (right more significantly impaired than left):  Positive Froment's sign (hyperflexion of IP of thumb with lateral resistive pinching); pt unable to make "OK" sign(unable to oppose thumb to index) ; positive wartenberg's sign(unable to adduct ring and small fingers); positive Bouvier's sign of small fingers.  Instructed pt in resistive grip exercises and attempted pinching exercises using medium resistive  putty, however due to weakness in intrinsic musculature of hands, pt would likely benefit more from downgrading to NMES to affected areas to promote increased strength and functional use.  Provided built up handle and instructed pt on use for self feeding. Pt able to self feed applesauce using right dominant hand with use of adaptation with reported increased ease.    Call bell in reach, seat alarm on at end of session.    Therapy Documentation Precautions:  Precautions Precautions: Fall Precaution Comments: Translator helpful but pt speaks pretty good English Restrictions Weight Bearing Restrictions: No   Therapy/Group: Individual Therapy  Amie Critchley 09/10/2020, 4:07 PM

## 2020-09-10 NOTE — Progress Notes (Signed)
PROGRESS NOTE   Subjective/Complaints: Per PT pt c/o tingling in hand but no pain. Standing to perform overhead task, still has some quad weakness, foot slap but not foot drop R>L  ROS-  Pt denies SOB, abd pain, CP,and vision changes   Objective:   No results found. No results for input(s): WBC, HGB, HCT, PLT in the last 72 hours. No results for input(s): NA, K, CL, CO2, GLUCOSE, BUN, CREATININE, CALCIUM in the last 72 hours.  Intake/Output Summary (Last 24 hours) at 09/10/2020 0818 Last data filed at 09/09/2020 1355 Gross per 24 hour  Intake 200 ml  Output --  Net 200 ml        Physical Exam: Vital Signs Blood pressure 106/65, pulse 81, temperature 97.8 F (36.6 C), resp. rate 18, height 5\' 8"  (1.727 m), weight 72.4 kg, SpO2 97 %.  General: No acute distress Mood and affect are appropriate Heart: Regular rate and rhythm no rubs murmurs or extra sounds Lungs: Clear to auscultation, breathing unlabored, no rales or wheezes Abdomen: Positive bowel sounds, soft nontender to palpation, nondistended Extremities: No clubbing, cyanosis, or edema  Skin: No evidence of breakdown, no evidence of rash Neurologic: Cranial nerves II through XII intact, motor strength is 4/5 in bilateral deltoid, bicep, tricep, grip, hip flexor, knee extensors, ankle dorsiflexor and plantar flexor Sensory exam normal sensation to light touch and proprioception in bilateral upper and lower extremities Cerebellar exam normal finger to nose to finger as well as heel to shin in bilateral upper and lower extremities Musculoskeletal: Full range of motion in all 4 extremities. No joint swelling    Assessment/Plan: 1. Functional deficits which require 3+ hours per day of interdisciplinary therapy in a comprehensive inpatient rehab setting.  Physiatrist is providing close team supervision and 24 hour management of active medical problems listed  below.  Physiatrist and rehab team continue to assess barriers to discharge/monitor patient progress toward functional and medical goals  Care Tool:  Bathing    Body parts bathed by patient: Right arm,Left arm,Chest,Abdomen,Front perineal area,Buttocks,Right upper leg,Left upper leg,Face   Body parts bathed by helper: Right lower leg,Left lower leg Body parts n/a: Front perineal area,Buttocks,Right upper leg,Left upper leg (did not attempt)   Bathing assist Assist Level: Minimal Assistance - Patient > 75%     Upper Body Dressing/Undressing Upper body dressing   What is the patient wearing?: Pull over shirt    Upper body assist Assist Level: Supervision/Verbal cueing    Lower Body Dressing/Undressing Lower body dressing    Lower body dressing activity did not occur: Refused What is the patient wearing?: Pants     Lower body assist Assist for lower body dressing: Moderate Assistance - Patient 50 - 74%     Toileting Toileting Toileting Activity did not occur and hygiene only): Refused  Toileting assist Assist for toileting: Maximal Assistance - Patient 25 - 49%     Transfers Chair/bed transfer  Transfers assist  Chair/bed transfer activity did not occur: Refused  Chair/bed transfer assist level: Minimal Assistance - Patient > 75% Chair/bed transfer assistive device: Press photographer   Ambulation assist   Ambulation activity did not  occur: Refused  Assist level: Minimal Assistance - Patient > 75% Assistive device: Walker-rolling Max distance: 22ft   Walk 10 feet activity   Assist  Walk 10 feet activity did not occur: Refused  Assist level: Minimal Assistance - Patient > 75% Assistive device: Walker-rolling   Walk 50 feet activity   Assist Walk 50 feet with 2 turns activity did not occur: Safety/medical concerns  Assist level: Minimal Assistance - Patient > 75% Assistive device: Walker-rolling    Walk 150 feet  activity   Assist Walk 150 feet activity did not occur: Safety/medical concerns         Walk 10 feet on uneven surface  activity   Assist Walk 10 feet on uneven surfaces activity did not occur: Safety/medical concerns         Wheelchair     Assist Will patient use wheelchair at discharge?: No Type of Wheelchair: Manual    Wheelchair assist level: Contact Guard/Touching assist Max wheelchair distance: 100'    Wheelchair 50 feet with 2 turns activity    Assist            Wheelchair 150 feet activity     Assist          Blood pressure 106/65, pulse 81, temperature 97.8 F (36.6 C), resp. rate 18, height 5\' 8"  (1.727 m), weight 72.4 kg, SpO2 97 %.    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 Antithrombotics: -DVT/anticoagulation:Pharmaceutical:Lovenox -antiplatelet therapy: N/A 3. Pain Management:Gabapentin 300mg  TID-improved  , we discussed the process of gradually increasing pain med and that it may take several days, discussed need to participate in PT< OT, even if pain is not under optimal control  Per Dr 5/11 2/27- tried adding Duloxetine and increasing Gabapentin to 600 mg BID yesterday- have to stop Duloxetine made ill- will add to allergy list.   Will change gabapentin to 400mg  TID  4. Mood:LCSW to follow for evaluation and support. -antipsychotic agents: N/A 5. Neuropsych: This patientiscapable of making decisions on hisown behalf. 6. Skin/Wound Care:routine pressure relief measures. 7. Fluids/Electrolytes/Nutrition:Monitor I/O intake limited but BUN and creat nl , d/c IV 8. Dental pain:resolved --Educated on importance of oral care due to recessed gums. --Will order peridex bid 9. T2DM-new  diagnosis: Hgb A1c-7.0--will consult dietician to educate on CM diet --Monitor BS ac/hs with SSI for elevated BS CBG (last 3)  Recent Labs    09/09/20 1610 09/09/20 2108 09/10/20 0613  GLUCAP 140* 165* 166*  3/3-controlled   10. Vitamin B 12 deficiency: Being supplemented 11. Hyponatremia: Slowly improving from 129-->134 today. Continue to monitor. 12. Leucocytosis: Likely reactive due to fever  steroids. No clear indication for augmentin  --Monitor for fevers and other signs of infection.  2/27- had T of 99.4 Tc=Tm today- will monitor closely and recheck labs in AM  13.  Diarrhea , likely abx associated with d/c and monitor     LOS: 9 days A FACE TO FACE EVALUATION WAS PERFORMED  2109 09/10/2020, 8:18 AM

## 2020-09-10 NOTE — Progress Notes (Signed)
Physical Therapy Session Note  Patient Details  Name: Jacob Cameron MRN: [id number] Date of Birth: 1956-04-29  Today's Date: 09/10/2020 PT Individual Time: 0809-0905 PT Individual Time Calculation (min): 56 min   Short Term Goals: Week 2:  PT Short Term Goal 1 (Week 2): = to LTGs based on ELOS  Skilled Therapeutic Interventions/Progress Updates:    Pt received supine in bed and agreeable to therapy session. Reports he took his nausea medication this AM. Supine>sitting R EOB with supervision. L stand pivot EOB>w/c using RW with CGA/min assist for lifting/balance - requires 2nd attempt to come to stand due to lack of powering up through his LEs. Transported to/from gym in w/c for time management and energy conservation. Gait training 40ft, 72ft using RW with CGA/min assist for balance - continues to demo significant compensation with WBing down through UEs on RW causing increased trunk/hip flexion, despite cuing unable to correct; demos improving B LE coordination though still wider BOS. Standing balance task with L HHA while placing green and red clothespins on basketball goal with slight cross body reach - min assist for balance and pt intermittently using backs of legs on mat for stability though able to progress off of the mat. MD in/out for morning assessment. Gait training ~61ft using +2 min assist via 3 Musketeers to decrease UE compensation - pt demos improved upright posture with increased B LE WBing. Transported back to room and pt agreeable to remain sitting in w/c - left with needs in reach and seat belt alarm on.   Therapy Documentation Precautions:  Precautions Precautions: Fall Precaution Comments: Translator helpful but pt speaks pretty good English Restrictions Weight Bearing Restrictions: No  Pain: Reports "tingling" pain in bilateral feet and hands - premedicated.    Therapy/Group: Individual Therapy  Ginny Forth , PT, DPT, CSRS  09/10/2020, 7:49 AM

## 2020-09-10 NOTE — Progress Notes (Signed)
Physical Therapy Session Note  Patient Details  Name: Jacob Cameron MRN: [id number] Date of Birth: 1955-08-01  Today's Date: 09/10/2020 PT Individual Time: 1115-1155 PT Individual Time Calculation (min): 40 min   Short Term Goals: Week 2:  PT Short Term Goal 1 (Week 2): = to LTGs based on ELOS  Skilled Therapeutic Interventions/Progress Updates:    Patient received sitting up in bed, agreeable to PT. He reports "burning and aching" pain in B LE, but did not rate, premedicated. PT providing rest breaks, distractions and repositioning to assist with pain management. He was able to come sit edge of bed with supervision and transfer to wc via squat pivot with CGA. PT transporting patient to therapy gym for time management and energy conservation. He completed 4x2' on NuStep with B UE/LE for improved endurance and appropriate activation of muscles and coordination of fluid movement. Patient report 5/10 on Borg scale of difficulty. Patient ambulating 4x15' with RW and light CGA/supervision. Verbal cues needed to prevent patient from locking B knees in stance. Patient likely does so for improved stability of B knees, but demonstrated no buckling and good control of LEs with appropriate knee flexion. Patient safely ambulating household/limited household distances at this time. He was able to propel himself in wc back to his room using B UE and supervision. Transferring back to bed via squat pivot with CGA. Bed alarm on, call light within reach.   Therapy Documentation Precautions:  Precautions Precautions: Fall Precaution Comments: Translator helpful but pt speaks pretty good English Restrictions Weight Bearing Restrictions: No   Therapy/Group: Individual Therapy  Elizebeth Koller, PT, DPT, CBIS  09/10/2020, 7:34 AM

## 2020-09-10 NOTE — Plan of Care (Signed)
  Problem: RH Wheelchair Mobility Goal: LTG Patient will propel w/c in controlled environment (PT) Description: LTG: Patient will propel wheelchair in controlled environment, # of feet with assist (PT) Flowsheets (Taken 09/10/2020 1917) LTG: Pt will propel w/c in controlled environ  assist needed:: (goal added to allow pt increased independence with functional mobility) Supervision/Verbal cueing LTG: Propel w/c distance in controlled environment: 175ft Note: goal added to allow pt increased independence with functional mobility Goal: LTG Patient will propel w/c in home environment (PT) Description: LTG: Patient will propel wheelchair in home environment, # of feet with assistance (PT). Flowsheets (Taken 09/10/2020 1917) LTG: Pt will propel w/c in home environ  assist needed:: (goal added to allow pt increased independence with functional mobility) Supervision/Verbal cueing LTG: Propel w/c distance in home environment: 70ft Note: goal added to allow pt increased independence with functional mobility

## 2020-09-11 LAB — GLUCOSE, CAPILLARY
Glucose-Capillary: 121 mg/dL — ABNORMAL HIGH (ref 70–99)
Glucose-Capillary: 94 mg/dL (ref 70–99)
Glucose-Capillary: 106 mg/dL — ABNORMAL HIGH (ref 70–99)
Glucose-Capillary: 187 mg/dL — ABNORMAL HIGH (ref 70–99)

## 2020-09-11 NOTE — Progress Notes (Signed)
Physical Therapy Session Note  Patient Details  Name: Jacob Cameron MRN: [id number] Date of Birth: 02/15/1956  Today's Date: 09/11/2020 PT Individual Time: 1000-1110 PT Individual Time Calculation (min): 70 min   Short Term Goals: Week 2:  PT Short Term Goal 1 (Week 2): = to LTGs based on ELOS  Skilled Therapeutic Interventions/Progress Updates:    Patient received sitting up in wc, agreeable to PT. He reports pain in B LE, but does not rate- premedicated. PT providing rest breaks, distractions and repositioning to assist with pain management. Patient able to propel himself in wc 121ft with B UE and supervision. Fruit scavenger hunt in mock kitchen completed with patient requiring verbal cues to allow for appropriate B knee flexion throughout gait cycle. He was able to ambulate total of ~75ft at a time to collect all of the fruit. Patient requiring verbal cues for safety on how to approach cabinets/fridge. Posterior LOB noted x2 with MaxA from PT provided to regain balance. Patient demonstrating minimal, if any, balance strategies. Fwd/backward ambulation in parallel bars with B UE progressing to U UE support. U LE fwd/backward stepping to dots with B UE x12 with decreased step length and confidence with backward step. Patient will benefit from continued practice with retro stepping and associated responses to develop protective reflexes for LOB. Patient completing wc mobility practice zig-zagging to cones to emulate home environment with tight turns. He was able to do so with supervision. Patient completing 5x1' on Kinetron 20cm/s for further posterior chain activation. Patient returning to his room, transferring to bed. Bed alarm on, call light within reach.   Therapy Documentation Precautions:  Precautions Precautions: Fall Precaution Comments: Translator helpful but pt speaks pretty good English Restrictions Weight Bearing Restrictions: No    Therapy/Group: Individual Therapy  Elizebeth Koller, PT, DPT, CBIS  09/11/2020, 7:36 AM

## 2020-09-11 NOTE — Progress Notes (Addendum)
Patient ID: Jacob Cameron, male   DOB: [birth year], 65 y.o.   MRN: [id number]   Patient accepted by [facility], orders faxed  Lavera Guise, [place] [phone]

## 2020-09-11 NOTE — Progress Notes (Signed)
Occupational Therapy Session Note  Patient Details  Name: Jacob Cameron MRN: [id number] Date of Birth: 18-Jun-1956  Today's Date: 09/11/2020 OT Individual Time: 0903-1001 OT Individual Time Calculation (min): 58 min    Short Term Goals: Week 2:  OT Short Term Goal 1 (Week 2): patient will complete toileting with min A OT Short Term Goal 2 (Week 2): Pt will donn his shoes and socks with supervision. OT Short Term Goal 3 (Week 2): Pt will complete toilet transfer with min guard assist using the RW for support. OT Short Term Goal 4 (Week 2): Pt will stand at the sink for 2 mins with close supervision for completion of 1-2 grooming tasks.  Skilled Therapeutic Interventions/Progress Updates:    Session 1 [phone])  Pt in bed to start with transition to the EOB with close supervision.  BP was taken in sitting at 92/57 and then in standing 101/66.  Pt reported no symptoms of dizziness.  He was able to complete stand pivot transfer to the wheelchair with min assist using the RW for support.  He then propelled himself down to the therapy gym with supervision in the manual wheelchair.  He was then able to complete further transfers to the therapy mat with min.  Had him focus on BUE coordination with use of the nuts and bolts board.  He was able to use BUEs to screw nuts on bolts while having to lift his arms to chest level or higher.  He alternated with each UE, demonstrating some drops at times secondary to decreased coordination.  Therapist had to loosed two of the nuts secondary to decreased hand strength.  Next, he worked on 2 sets of 20 reps tip to tip pinch using the yellow clothespins with both the right and left.  He needed increased positioning to keep from dropping them.  Red clothespin was completed for 2 sets of 10 reps with inability to complete full closure.  Once again he needed to reposition the pin in his hand to keep from dropping it.  Finished session with transfer to the wheelchair and then  back to the room where he remained up for PT session coming in next.    Session 2: (1334-1400)  Pt in bed to start, agreeable to participation in therapy.  He was able to transfer to the EOB with supervision and then therapist assisted with donning and tying his shoes secondary to decreased time.  He completed stand pivot transfer to the wheelchair with min assist and then rolled himself down to the tub/room for session.  He was able to complete stand pivot transfer to the tub bench with min assist using the RW.  Discussed need for a hand held shower as well as tub bench.  Pt reports taking sponge baths at home sometimes and then also showering.  He did finally state that he has access to a walk-in shower as well.  This information was not noted when asking him earlier.  Returned to the room at the end of the session with transfer to the bed with min assist.  Encouraged continued use of his therapy putty for hand strengthening over the weekend.    Therapy Documentation Precautions:  Precautions Precautions: Fall Precaution Comments: Translator helpful but pt speaks pretty good English Restrictions Weight Bearing Restrictions: No  Pain: Pain Assessment Pain Scale: 0-10 Pain Score: 4  Pain Type: Neuropathic pain Pain Location: Leg Pain Orientation: Right;Left Pain Descriptors / Indicators: Tingling Pain Onset: On-going Pain Intervention(s): Repositioned;Emotional support ADL:  See Care Tool Section for some details of mobility and selfcare  Therapy/Group: Individual Therapy  Trish Mancinelli OTR/L 09/11/2020, 10:02 AM

## 2020-09-11 NOTE — Progress Notes (Signed)
PROGRESS NOTE   Subjective/Complaints: No issues overnite , diarrhea resolved per RN  Discussed that tingling may take a couple months to resolve ROS-  Pt denies SOB, abd pain, CP,and vision changes   Objective:   No results found. No results for input(s): WBC, HGB, HCT, PLT in the last 72 hours. No results for input(s): NA, K, CL, CO2, GLUCOSE, BUN, CREATININE, CALCIUM in the last 72 hours. No intake or output data in the 24 hours ending 09/11/20 0815      Physical Exam: Vital Signs Blood pressure (!) 91/57, pulse 86, temperature 98.5 F (36.9 C), resp. rate 18, height 5\' 8"  (1.727 m), weight 71.4 kg, SpO2 99 %.   General: No acute distress Mood and affect are appropriate Heart: Regular rate and rhythm no rubs murmurs or extra sounds Lungs: Clear to auscultation, breathing unlabored, no rales or wheezes Abdomen: Positive bowel sounds, soft nontender to palpation, nondistended Extremities: No clubbing, cyanosis, or edema  Skin: No evidence of breakdown, no evidence of rash Neurologic: Cranial nerves II through XII intact, motor strength is 4/5 in bilateral deltoid, bicep, tricep, grip, hip flexor, knee extensors, ankle dorsiflexor and plantar flexor Sensory exam normal sensation to light touch and proprioception in bilateral upper and lower extremities Cerebellar exam normal finger to nose to finger as well as heel to shin in bilateral upper and lower extremities Musculoskeletal: Full range of motion in all 4 extremities. No joint swelling    Assessment/Plan: 1. Functional deficits which require 3+ hours per day of interdisciplinary therapy in a comprehensive inpatient rehab setting.  Physiatrist is providing close team supervision and 24 hour management of active medical problems listed below.  Physiatrist and rehab team continue to assess barriers to discharge/monitor patient progress toward functional and medical  goals  Care Tool:  Bathing    Body parts bathed by patient: Right arm,Left arm,Chest,Abdomen,Front perineal area,Buttocks,Right upper leg,Left upper leg,Face   Body parts bathed by helper: Right lower leg,Left lower leg Body parts n/a: Front perineal area,Buttocks,Right upper leg,Left upper leg (did not attempt)   Bathing assist Assist Level: Minimal Assistance - Patient > 75%     Upper Body Dressing/Undressing Upper body dressing   What is the patient wearing?: Pull over shirt    Upper body assist Assist Level: Supervision/Verbal cueing    Lower Body Dressing/Undressing Lower body dressing    Lower body dressing activity did not occur: Refused What is the patient wearing?: Pants     Lower body assist Assist for lower body dressing: Moderate Assistance - Patient 50 - 74%     Toileting Toileting Toileting Activity did not occur and hygiene only): Refused  Toileting assist Assist for toileting: Maximal Assistance - Patient 25 - 49%     Transfers Chair/bed transfer  Transfers assist  Chair/bed transfer activity did not occur: Refused  Chair/bed transfer assist level: Minimal Assistance - Patient > 75% Chair/bed transfer assistive device: Walker,Armrests   Locomotion Ambulation   Ambulation assist   Ambulation activity did not occur: Refused  Assist level: Minimal Assistance - Patient > 75% Assistive device: Walker-rolling Max distance: 11ft   Walk 10 feet activity   Assist  Walk 10 feet activity did not occur: Refused  Assist level: Minimal Assistance - Patient > 75% Assistive device: Walker-rolling   Walk 50 feet activity   Assist Walk 50 feet with 2 turns activity did not occur: Safety/medical concerns  Assist level: Minimal Assistance - Patient > 75% Assistive device: Walker-rolling    Walk 150 feet activity   Assist Walk 150 feet activity did not occur: Safety/medical concerns         Walk 10 feet on uneven  surface  activity   Assist Walk 10 feet on uneven surfaces activity did not occur: Safety/medical concerns         Wheelchair     Assist Will patient use wheelchair at discharge?: Yes Type of Wheelchair: Manual    Wheelchair assist level: Supervision/Verbal cueing Max wheelchair distance: 150    Wheelchair 50 feet with 2 turns activity    Assist        Assist Level: Supervision/Verbal cueing   Wheelchair 150 feet activity     Assist      Assist Level: Supervision/Verbal cueing   Blood pressure (!) 91/57, pulse 86, temperature 98.5 F (36.9 C), resp. rate 18, height 5\' 8"  (1.727 m), weight 71.4 kg, SpO2 99 %.    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 Antithrombotics: -DVT/anticoagulation:Pharmaceutical:Lovenox -antiplatelet therapy: N/A 3. Pain Management:Gabapentin 300mg  TID-improved  , we discussed the process of gradually increasing pain med and that it may take several days, discussed need to participate in PT< OT, even if pain is not under optimal control  Per Dr 5/11 2/27- tried adding Duloxetine and increasing Gabapentin to 600 mg BID yesterday- have to stop Duloxetine made ill- will add to allergy list.   Will change gabapentin to 400mg  TID  4. Mood:LCSW to follow for evaluation and support. -antipsychotic agents: N/A 5. Neuropsych: This patientiscapable of making decisions on hisown behalf. 6. Skin/Wound Care:routine pressure relief measures. 7. Fluids/Electrolytes/Nutrition:Monitor I/O intake limited but BUN and creat nl , d/c IV 8. Dental pain:resolved --Educated on importance of oral care due to recessed gums. --Will order peridex bid 9. T2DM-new diagnosis: Hgb A1c-7.0--will consult dietician to  educate on CM diet --Monitor BS ac/hs with SSI for elevated BS CBG (last 3)  Recent Labs    09/10/20 1640 09/10/20 2107 09/11/20 0552  GLUCAP 108* 150* 121*  3/4-controlled   10. Vitamin B 12 deficiency: Being supplemented 11. Hyponatremia: Slowly improving from 129-->134 today. Continue to monitor. 12. Leucocytosis: Likely reactive due to fever  steroids. No clear indication for augmentin  --Monitor for fevers and other signs of infection.  2/27- had T of 99.4 Tc=Tm today- will monitor closely and recheck labs in AM  13.  Diarrhea , likely abx associated with d/c and monitor     LOS: 10 days A FACE TO FACE EVALUATION WAS PERFORMED  2108 09/11/2020, 8:15 AM

## 2020-09-11 NOTE — Progress Notes (Signed)
Patient ID: Jacob Cameron, male   DOB: [birth year], 65 y.o.   MRN: [id number]  Patient wheelchair and RW ordered through [employer].   [place], [place] [phone]

## 2020-09-12 LAB — GLUCOSE, CAPILLARY
Glucose-Capillary: 136 mg/dL — ABNORMAL HIGH (ref 70–99)
Glucose-Capillary: 109 mg/dL — ABNORMAL HIGH (ref 70–99)
Glucose-Capillary: 101 mg/dL — ABNORMAL HIGH (ref 70–99)
Glucose-Capillary: 118 mg/dL — ABNORMAL HIGH (ref 70–99)

## 2020-09-12 NOTE — Progress Notes (Signed)
Occupational Therapy Session Note  Patient Details  Name: Jacob Cameron MRN: [id number] Date of Birth: 1956/06/07  Today's Date: 09/12/2020 OT Individual Time: 0037-0488 OT Individual Time Calculation (min): 48 min    Short Term Goals: Week 1:  OT Short Term Goal 1 (Week 1): patient will tolerate change of position and sitting in w/c 2+ hours OT Short Term Goal 1 - Progress (Week 1): Met OT Short Term Goal 2 (Week 1): patient will complete bed mobility and functional transfers with min A OT Short Term Goal 2 - Progress (Week 1): Met OT Short Term Goal 3 (Week 1): patient will complete bathing and grooming tasks w/c level with min a OT Short Term Goal 3 - Progress (Week 1): Met OT Short Term Goal 4 (Week 1): patient will complete toileting with min A OT Short Term Goal 4 - Progress (Week 1): Not met  Skilled Therapeutic Interventions/Progress Updates: Patient participated as follows:   Transfer supine with head of bed eleveated to EOB= mod I;  EOB to squat pivot transfer to w/c to his left= close S Face, upper bodybathig and dressing=setup sitting in w/c at sink;   Oral care = Mod I;  Patient stated he did not need or want to wash lower body  With rest of session he completed various bilateral hand strengthening, coordination and FM tasks in order to increase indepencenace and accurate use of digits, hands and forearms to complete ADLs and IADLs as independently as possible.  At end f session, he complete w/c to bed transfer to his right with close S for squat pivot transfers.  Continue OT plan of care for this client.     Therapy Documentation Precautions:  Precautions Precautions: Fall Precaution Comments: Translator helpful but pt speaks pretty good English Restrictions Weight Bearing Restrictions: No    Pain:denied    Therapy/Group: Individual Therapy  Alfredia Ferguson [facility] 09/12/2020, 12:36 PM

## 2020-09-13 LAB — GLUCOSE, CAPILLARY
Glucose-Capillary: 122 mg/dL — ABNORMAL HIGH (ref 70–99)
Glucose-Capillary: 94 mg/dL (ref 70–99)
Glucose-Capillary: 96 mg/dL (ref 70–99)
Glucose-Capillary: 147 mg/dL — ABNORMAL HIGH (ref 70–99)

## 2020-09-13 NOTE — Progress Notes (Signed)
Physical Therapy Session Note  Patient Details  Name: Jacob Cameron MRN: [id number] Date of Birth: Dec 25, 1955  Today's Date: 09/13/2020 PT Individual Time: 1015-1110; 2426-8341; 9622-2979 PT Individual Time Calculation (min): 55 min and 35 min and 38 min PT Missed Time: 17 min Missed Time Reason: patient fatigue  Short Term Goals: Week 2:  PT Short Term Goal 1 (Week 2): = to LTGs based on ELOS  Skilled Therapeutic Interventions/Progress Updates:    Session 1: Pt received supine in bed, agreeable to PT session. Pt reports tingling in B hands, legs, and feet, reports taking medication for symptoms of nerve pain.. Bed mobility independent. Sit to stand with min A to RW throughout session. Stand pivot transfer bed to w/c with RW and CGA throughout session. Supervision level for w/c mobility to/from therapy gym. Pt requires assist for management of w/c parts and to safely lock brakes prior to initiating transfers. Squats x 5 reps, 2 x 10 reps with RW and min A for balance for BLE strengthening with multimodal cueing for correct body mechanics. Standing alt L/R 4" step-taps with RW and CGA for balance. Attempt to have pt perform step-taps with no UE support, unable to confidently maintain balance and let go of RW with BUE. Static standing balance 3 x 2 min with min HHA, attempt to progress to no UE support but pt remains fearful of falling. Pt also reports onset of BLE fatigue and declines further standing activity this session. Seated ball toss x 5 min for global endurance and sitting balance training. Pt requests to return to bed at end of session, independent for bed mobility. Pt left supine in bed with needs in reach, bed alarm in place at end of session.  Session 2: Pt received supine in bed, agreeable to PT session. Pt reports tingling in B hands, legs, and feet. Bed mobility independent. Sit to stand with min A to RW throughout session. Stand pivot transfer with RW and CGA throughout session.  Ambulation x 95 ft, 2 x 100 ft, x 115 ft with RW and min A for balance. Pt reports impaired proprioception in BLE and tends to ambulate with flexed head in order to look down at feet as well as exhibits onset of trunk flexion with fatigue and heavy BUE reliance with shoulder elevation. Encouraged upright posture and pt exhibits improved posture with cueing. Manual w/c propulsion x 300 ft with use of BUE at Supervision level weaving through cones and navigating functional environment, cues for obstacle avoidance. Pt requests to return to bed due to fatigue, independent for bed mobility. Pt missed 10 min of scheduled therapy session due to fatigue.  Session 3: Pt received seated on BSC in room finishing toileting, agreeable to therapy session. No complaints of pain. Pt is able to clean frontal periarea with setup A. Sit to stand with min A to RW. Pt unable to perform posterior peri-hygiene in standing due to fear of falling with letting go of RW with one UE, dependent for remainder of peri-hygiene and clothing management. Stand pivot transfer to w/c with RW and CGA. While seated in w/c at sink pt able to perform oral hygiene, wash his face, apply powder to UB, and don/doff scrub top with setup A. Pt requests to return to bed due to fatigue. Stand pivot transfer w/c to bed with RW and CGA. Pt is independent for bed mobility. Pt missed 7 min of scheduled therapy session due to fatigue. Pt left supine in bed with needs in reach, bed  alarm in place at end of session.  Therapy Documentation Precautions:  Precautions Precautions: Fall Precaution Comments: Translator helpful but pt speaks pretty good English Restrictions Weight Bearing Restrictions: No   Therapy/Group: Individual Therapy   Peter Congo, PT, DPT  09/13/2020, 12:45 PM

## 2020-09-13 NOTE — Progress Notes (Signed)
PROGRESS NOTE   Subjective/Complaints: Slept well, using comode rather than toilet, numbness and tingling but not pain  ROS-  Pt denies SOB, abd pain, CP,and vision changes   Objective:   No results found. No results for input(s): WBC, HGB, HCT, PLT in the last 72 hours. No results for input(s): NA, K, CL, CO2, GLUCOSE, BUN, CREATININE, CALCIUM in the last 72 hours.  Intake/Output Summary (Last 24 hours) at 09/13/2020 0850 Last data filed at 09/12/2020 1900 Gross per 24 hour  Intake 730 ml  Output --  Net 730 ml        Physical Exam: Vital Signs Blood pressure (!) 88/59, pulse 86, temperature 98 F (36.7 C), resp. rate 16, height 5\' 8"  (1.727 m), weight 72.4 kg, SpO2 97 %.  General: No acute distress Mood and affect are appropriate Heart: Regular rate and rhythm no rubs murmurs or extra sounds Lungs: Clear to auscultation, breathing unlabored, no rales or wheezes Abdomen: Positive bowel sounds, soft nontender to palpation, nondistended Extremities: No clubbing, cyanosis, or edema   Skin: No evidence of breakdown, no evidence of rash Neurologic: Cranial nerves II through XII intact, motor strength is 4/5 in bilateral deltoid, bicep, tricep, grip, hip flexor, knee extensors, ankle dorsiflexor and plantar flexor Sensory exam normal sensation to light touch and proprioception in bilateral upper and lower extremities Cerebellar exam normal finger to nose to finger as well as heel to shin in bilateral upper and lower extremities Musculoskeletal: Full range of motion in all 4 extremities. No joint swelling    Assessment/Plan: 1. Functional deficits which require 3+ hours per day of interdisciplinary therapy in a comprehensive inpatient rehab setting.  Physiatrist is providing close team supervision and 24 hour management of active medical problems listed below.  Physiatrist and rehab team continue to assess barriers to  discharge/monitor patient progress toward functional and medical goals  Care Tool:  Bathing    Body parts bathed by patient: Right arm,Left arm,Chest,Abdomen,Front perineal area,Buttocks,Right upper leg,Left upper leg,Face   Body parts bathed by helper: Right lower leg,Left lower leg Body parts n/a: Front perineal area,Buttocks,Right upper leg,Left upper leg (did not attempt)   Bathing assist Assist Level: Minimal Assistance - Patient > 75%     Upper Body Dressing/Undressing Upper body dressing   What is the patient wearing?: Pull over shirt    Upper body assist Assist Level: Supervision/Verbal cueing    Lower Body Dressing/Undressing Lower body dressing    Lower body dressing activity did not occur: Refused What is the patient wearing?: Pants     Lower body assist Assist for lower body dressing: Moderate Assistance - Patient 50 - 74%     Toileting Toileting Toileting Activity did not occur and hygiene only): Refused  Toileting assist Assist for toileting: Maximal Assistance - Patient 25 - 49%     Transfers Chair/bed transfer  Transfers assist  Chair/bed transfer activity did not occur: Refused  Chair/bed transfer assist level: Contact Guard/Touching assist Chair/bed transfer assistive device: Walker,Armrests   Locomotion Ambulation   Ambulation assist   Ambulation activity did not occur: Refused  Assist level: Minimal Assistance - Patient > 75% Assistive device: Walker-rolling Max  distance: 30   Walk 10 feet activity   Assist  Walk 10 feet activity did not occur: Refused  Assist level: Minimal Assistance - Patient > 75% Assistive device: Walker-rolling   Walk 50 feet activity   Assist Walk 50 feet with 2 turns activity did not occur: Safety/medical concerns  Assist level: Minimal Assistance - Patient > 75% Assistive device: Walker-rolling    Walk 150 feet activity   Assist Walk 150 feet activity did not occur:  Safety/medical concerns         Walk 10 feet on uneven surface  activity   Assist Walk 10 feet on uneven surfaces activity did not occur: Safety/medical concerns         Wheelchair     Assist Will patient use wheelchair at discharge?: Yes Type of Wheelchair: Manual    Wheelchair assist level: Supervision/Verbal cueing Max wheelchair distance: 150    Wheelchair 50 feet with 2 turns activity    Assist        Assist Level: Supervision/Verbal cueing   Wheelchair 150 feet activity     Assist      Assist Level: Supervision/Verbal cueing   Blood pressure (!) 88/59, pulse 86, temperature 98 F (36.7 C), resp. rate 16, height 5\' 8"  (1.727 m), weight 72.4 kg, SpO2 97 %.    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 Antithrombotics: -DVT/anticoagulation:Pharmaceutical:Lovenox -antiplatelet therapy: N/A 3. Pain Management:Gabapentin 300mg  TID-improved  , we discussed the process of gradually increasing pain med and that it may take several days, discussed need to participate in PT< OT, even if pain is not under optimal control  Per Dr 5/11 2/27- tried adding Duloxetine and increasing Gabapentin to 600 mg BID yesterday- have to stop Duloxetine made ill- will add to allergy list.   Will change gabapentin to 400mg  TID - now mainly with parasthesia not dyesthesias 4. Mood:LCSW to follow for evaluation and support. -antipsychotic agents: N/A 5. Neuropsych: This patientiscapable of making decisions on hisown behalf. 6. Skin/Wound Care:routine pressure relief measures. 7. Fluids/Electrolytes/Nutrition:Monitor I/O intake limited but BUN and creat nl , d/c IV 8. Dental pain:resolved --Educated on importance of oral care due to recessed  gums. --Will order peridex bid 9. T2DM-new diagnosis: Hgb A1c-7.0--will consult dietician to educate on CM diet --Monitor BS ac/hs with SSI for elevated BS CBG (last 3)  Recent Labs    09/12/20 1712 09/12/20 2101 09/13/20 0558  GLUCAP 101* 118* 122*  3/6-controlled   10. Vitamin B 12 deficiency: Being supplemented 11. Hyponatremia: Slowly improving from 129-->134 today. Continue to monitor. 12. Leucocytosis: Likely reactive due to fever  steroids. No clear indication for augmentin  --Monitor for fevers and other signs of infection.  2/27- had T of 99.4 Tc=Tm today- will monitor closely and recheck labs in AM  13.  Diarrhea , likely abx associated with d/c and monitor     LOS: 12 days A FACE TO FACE EVALUATION WAS PERFORMED  11/12/20 09/13/2020, 8:50 AM

## 2020-09-14 DIAGNOSIS — R197 Diarrhea, unspecified: Secondary | ICD-10-CM

## 2020-09-14 DIAGNOSIS — M792 Neuralgia and neuritis, unspecified: Secondary | ICD-10-CM

## 2020-09-14 DIAGNOSIS — E119 Type 2 diabetes mellitus without complications: Secondary | ICD-10-CM

## 2020-09-14 DIAGNOSIS — D62 Acute posthemorrhagic anemia: Secondary | ICD-10-CM

## 2020-09-14 LAB — BASIC METABOLIC PANEL
Anion gap: 10 (ref 5–15)
BUN: 8 mg/dL (ref 8–23)
CO2: 25 mmol/L (ref 22–32)
Calcium: 9.1 mg/dL (ref 8.9–10.3)
Chloride: 101 mmol/L (ref 98–111)
Creatinine, Ser: 0.91 mg/dL (ref 0.61–1.24)
GFR, Estimated: >60 mL/min (ref >60)
Glucose, Bld: 122 mg/dL — ABNORMAL HIGH (ref 70–99)
Potassium: 3.8 mmol/L (ref 3.5–5.1)
Sodium: 136 mmol/L (ref 135–145)

## 2020-09-14 LAB — CBC
HCT: 33.2 % — ABNORMAL LOW (ref 39.0–52.0)
Hemoglobin: 11 g/dL — ABNORMAL LOW (ref 13.0–17.0)
MCH: 28.3 pg (ref 26.0–34.0)
MCHC: 33.1 g/dL (ref 30.0–36.0)
MCV: 85.3 fL (ref 80.0–100.0)
Platelets: 266 10*3/uL (ref 150–400)
RBC: 3.89 MIL/uL — ABNORMAL LOW (ref 4.22–5.81)
RDW: 14.4 % (ref 11.5–15.5)
WBC: 7.2 10*3/uL (ref 4.0–10.5)
nRBC: 0 % (ref 0.0–0.2)

## 2020-09-14 LAB — GLUCOSE, CAPILLARY
Glucose-Capillary: 125 mg/dL — ABNORMAL HIGH (ref 70–99)
Glucose-Capillary: 99 mg/dL (ref 70–99)
Glucose-Capillary: 105 mg/dL — ABNORMAL HIGH (ref 70–99)
Glucose-Capillary: 101 mg/dL — ABNORMAL HIGH (ref 70–99)

## 2020-09-14 NOTE — Progress Notes (Signed)
Physical Therapy Session Note  Patient Details  Name: Jacob Cameron MRN: [id number] Date of Birth: [birth year]  Today's Date: 09/14/2020 PT Individual Time: 1000-1055; 1400-1455 PT Individual Time Calculation (min): 55 min and 55 mins  Short Term Goals: Week 2:  PT Short Term Goal 1 (Week 2): = to LTGs based on ELOS  Skilled Therapeutic Interventions/Progress Updates:    Session 1: Patient received reclined in bed, agreeable to PT. He reports N/T in all 4 distal extremities. PT reiterating that it will take time for this sensation to pass. Patient verbalized understanding. He was able to come sit edge of bed independently. MinA to don sneakers. Transferring to wc via squat pivot with CGA. He was able to propel himself in wc to therapy gym with supervision, transferring to therapy mat via squat pivot with CGA. Seated therex as follows: LAQ 3x10 4# ankle weight Marches 3x10 4# ankle weight  Hip abduction 3x10 green theraband  HSC 3x10 green theraband  Patient able to complete 2x6 sit <>stand with CGA and verbal cues for anterior weight shift for increased efficiency and hand placement. He required extended seated rest break throughout session due to fatigue and encouragement to continue participating in session. Patient transferring back to wc via squat pivot with CGA and back to bed per his request with CGA. Bed alarm on, call light within reach.    Session 2: Patient received reclined in bed, tired, but agreeable to PT. He reports increase in N/T in distal extremities compared to AM session. Sons not present for family ed. Patient reports that they work until 6pm during the week. He states that wife can only provide supervision, where sons are able to provide physical assist as needed when present. PT followed up with SW regarding this. He was able to come sit edge of bed independently and don shoes with MinA. He transferred to wc via squat pivot with supervision and propelled himself in wc 259ft with  supervision. He was able to transfer into sedan-height car with CGA and RW. He was able to ambulate 74ft, 39ft, 12ft with RW and CGA. CGA/MinA needed for initial sit <>stand, fluctuating based on fatigue. He continues to require verbal cues to allow for adequate B knee flexion throughout gait cycle. Patient propelling himself in wc back to his room with supervision. Transferring to bed via squat pivot with supervision. Bed alarm on, call light within reach.   Therapy Documentation Precautions:  Precautions Precautions: Fall Precaution Comments: Translator helpful but pt speaks pretty good English Restrictions Weight Bearing Restrictions: No    Therapy/Group: Individual Therapy  Elizebeth Koller, PT, DPT, CBIS  09/14/2020, 7:39 AM

## 2020-09-14 NOTE — Progress Notes (Signed)
Patient ID: Jacob Cameron, male   DOB: [birth year], 65 y.o.   MRN: [id number]   New patient appointment scheduled with Esperanza Richters, PA-C at [facility] at [facility] on October 22, 2020 at 10:45 AM.   [address] [place], [place] [address]   [place], [place] [phone]

## 2020-09-14 NOTE — Progress Notes (Signed)
PROGRESS NOTE   Subjective/Complaints: Patient seen laying in bed this morning.  He appears to indicate that he slept well overnight and had a good weekend.  ROS: Patient denies CP, shortness of breath, nausea, vomiting, diarrhea.  Objective:   No results found. Recent Labs    09/14/20 0457  WBC 7.2  HGB 11.0*  HCT 33.2*  PLT 266   Recent Labs    09/14/20 0457  NA 136  K 3.8  CL 101  CO2 25  GLUCOSE 122*  BUN 8  CREATININE 0.91  CALCIUM 9.1    Intake/Output Summary (Last 24 hours) at 09/14/2020 1134 Last data filed at 09/13/2020 1338 Gross per 24 hour  Intake 240 ml  Output --  Net 240 ml        Physical Exam: Vital Signs Blood pressure 94/66, pulse 88, temperature 99.4 F (37.4 C), temperature source Oral, resp. rate 18, height 5\' 8"  (1.727 m), weight 72.1 kg, SpO2 98 %. Constitutional: No distress . Vital signs reviewed. HENT: Normocephalic.  Atraumatic. Eyes: EOMI. No discharge. Cardiovascular: No JVD.  RRR. Respiratory: Normal effort.  No stridor.  Bilateral clear to auscultation. GI: Non-distended.  BS +. Skin: Warm and dry.  Intact. Psych: Normal mood.  Normal behavior. Musc: No edema in extremities.  No tenderness in extremities. Neurologic:  Alert Motor: Grossly 4/5 throughout   Assessment/Plan: 1. Functional deficits which require 3+ hours per day of interdisciplinary therapy in a comprehensive inpatient rehab setting.  Physiatrist is providing close team supervision and 24 hour management of active medical problems listed below.  Physiatrist and rehab team continue to assess barriers to discharge/monitor patient progress toward functional and medical goals  Care Tool:  Bathing    Body parts bathed by patient: Right arm,Left arm,Chest,Abdomen,Front perineal area,Buttocks,Right upper leg,Left upper leg,Face   Body parts bathed by helper: Right lower leg,Left lower leg Body parts n/a:  Front perineal area,Buttocks,Right upper leg,Left upper leg (did not attempt)   Bathing assist Assist Level: Minimal Assistance - Patient > 75%     Upper Body Dressing/Undressing Upper body dressing   What is the patient wearing?: Pull over shirt    Upper body assist Assist Level: Supervision/Verbal cueing    Lower Body Dressing/Undressing Lower body dressing    Lower body dressing activity did not occur: Refused What is the patient wearing?: Pants     Lower body assist Assist for lower body dressing: Moderate Assistance - Patient 50 - 74%     Toileting Toileting Toileting Activity did not occur and hygiene only): Refused  Toileting assist Assist for toileting: Maximal Assistance - Patient 25 - 49%     Transfers Chair/bed transfer  Transfers assist  Chair/bed transfer activity did not occur: Refused  Chair/bed transfer assist level: Contact Guard/Touching assist Chair/bed transfer assistive device: Press photographer   Ambulation assist   Ambulation activity did not occur: Refused  Assist level: Minimal Assistance - Patient > 75% Assistive device: Walker-rolling Max distance: 115'   Walk 10 feet activity   Assist  Walk 10 feet activity did not occur: Refused  Assist level: Minimal Assistance - Patient > 75% Assistive device: Walker-rolling  Walk 50 feet activity   Assist Walk 50 feet with 2 turns activity did not occur: Safety/medical concerns  Assist level: Minimal Assistance - Patient > 75% Assistive device: Walker-rolling    Walk 150 feet activity   Assist Walk 150 feet activity did not occur: Safety/medical concerns         Walk 10 feet on uneven surface  activity   Assist Walk 10 feet on uneven surfaces activity did not occur: Safety/medical concerns         Wheelchair     Assist Will patient use wheelchair at discharge?: Yes Type of Wheelchair: Manual    Wheelchair assist level:  Supervision/Verbal cueing Max wheelchair distance: 150    Wheelchair 50 feet with 2 turns activity    Assist        Assist Level: Supervision/Verbal cueing   Wheelchair 150 feet activity     Assist      Assist Level: Supervision/Verbal cueing   Blood pressure 94/66, pulse 88, temperature 99.4 F (37.4 C), temperature source Oral, resp. rate 18, height 5\' 8"  (1.727 m), weight 72.1 kg, SpO2 98 %.    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 Antithrombotics: -DVT/anticoagulation:Pharmaceutical:Lovenox -antiplatelet therapy: N/A 3. Pain Management:  Per Dr 2/27- tried adding Duloxetine and increasing Gabapentin to 600 mg BID yesterday- have to stop Duloxetine made ill- will add to allergy list.    Increased gabapentin to 400mg  TID - now mainly with parasthesia not dyesthesias  Controlled on 3/7 4. Mood:LCSW to follow for evaluation and support. -antipsychotic agents: N/A 5. Neuropsych: This patientiscapable of making decisions on hisown behalf. 6. Skin/Wound Care:routine pressure relief measures. 7. Fluids/Electrolytes/Nutrition:Monitor I/Os  8. Dental pain:resolved --Educated on importance of oral care due to recessed gums. --Will order peridex bid 9. T2DM-new diagnosis: Hgb A1c-7.0--will consult dietician to educate on CM diet --Monitor BS ac/hs with SSI for elevated BS CBG (last 3)  Recent Labs    09/13/20 1710 09/13/20 2136 09/14/20 0544  GLUCAP 96 147* 125*   Mildly elevated on 2/7, continue to monitor 10. Vitamin B 12 deficiency: Being supplemented 11. Hyponatremia: Resolved. 12. Leucocytosis: Resolved   Likely reactive due to fever  steroids. No clear indication for augmentin  --Monitor for fevers and other signs of infection. 13.  Diarrhea  , likely abx associated with d/c and monitor   Patient improving 14.  Acute blood loss anemia  Hemoglobin 11.2 on 3/7  Continue to monitor  LOS: 13 days A FACE TO FACE EVALUATION WAS PERFORMED  Carlynn Leduc 4/7 09/14/2020, 11:34 AM

## 2020-09-14 NOTE — Progress Notes (Signed)
Occupational Therapy Session Note  Patient Details  Name: Jacob Cameron MRN: [id number] Date of Birth: 22-Jan-1956  Today's Date: 09/14/2020 OT Individual Time: 9528-4132 OT Individual Time Calculation (min): 56 min    Short Term Goals: Week 2:  OT Short Term Goal 1 (Week 2): patient will complete toileting with min A OT Short Term Goal 2 (Week 2): Pt will donn his shoes and socks with supervision. OT Short Term Goal 3 (Week 2): Pt will complete toilet transfer with min guard assist using the RW for support. OT Short Term Goal 4 (Week 2): Pt will stand at the sink for 2 mins with close supervision for completion of 1-2 grooming tasks.  Skilled Therapeutic Interventions/Progress Updates:    Pt in bed to start session agreeable to participation in OT session.  He was able to ambulate to the shower with use of the RW and min guard assist.  Min guard for removal of clothing prior to transfer from the 3:1 to the tub bench.  He was able to complete all bathing with min assist only for sit to stand using the grab bars.  He was then able to complete most dressing at min assist from the edge of the but bench, except for his gripper socks.  He ambulated out to the EOB with the RW and min assist for application of deodorant and powder.  He was able to donn his gripper socks on the EOB with supervision before taking a brief rest.  Finished session with therapy putty exercises for grip and pinch to BUEs while resting with HOB elevated.  He completed 10 repetitions for gross grasp and release with each hand using the red medium resistance therapy putty.  He was left in the bed with the alarm in place and the call button and phone in reach.    Therapy Documentation Precautions:  Precautions Precautions: Fall Precaution Comments: Translator helpful but pt speaks pretty good English Restrictions Weight Bearing Restrictions: No   Pain: Pain Assessment Pain Scale: 0-10 Pain Score: 4  Pain Type: Neuropathic  pain Pain Location: Foot Pain Orientation: Right;Left Pain Descriptors / Indicators: Discomfort Pain Onset: With Activity Pain Intervention(s): Repositioned Multiple Pain Sites: No ADL: See Care Tool Section for some details of mobility and selfcare  Therapy/Group: Individual Therapy  Nishanth Mccaughan OTR/L 09/14/2020, 1:57 PM

## 2020-09-15 LAB — GLUCOSE, CAPILLARY
Glucose-Capillary: 119 mg/dL — ABNORMAL HIGH (ref 70–99)
Glucose-Capillary: 102 mg/dL — ABNORMAL HIGH (ref 70–99)
Glucose-Capillary: 100 mg/dL — ABNORMAL HIGH (ref 70–99)
Glucose-Capillary: 136 mg/dL — ABNORMAL HIGH (ref 70–99)

## 2020-09-15 NOTE — Progress Notes (Signed)
Physical Therapy Session Note  Patient Details  Name: Jacob Cameron MRN: [id number] Date of Birth: 1955-08-17  Today's Date: 09/15/2020 PT Individual Time: 0915-1010; 1400-1435 PT Individual Time Calculation (min): 55 min and 35 mins Missed 25 mins  Short Term Goals: Week 2:  PT Short Term Goal 1 (Week 2): = to LTGs based on ELOS  Skilled Therapeutic Interventions/Progress Updates:    Session 1: Patient received sitting up in bed, agreeable to PT. He denies pain, but reports N/T in all 4 distal extremities. Patient continues to have questions regarding timeline for N/T to subside and whether a certain diet would assist. PT directing questions to MD. He was able to come sit edge of bed independently and don shoes with MinA. Transferring to wc via squat pivot with supervision and propelling himself to therapy gym ModI. Modified squats in parallel bars 3x8 with prolonged seated rest break between set due to fatigue. Patient requiring consistent encouragement to continue to participate in therapy this AM. 6" box step ups 3x6  alternating LE in parallel bars with B UE support + MinA. Episode of L knee buckling x1 when descending. Patient completing sit <> stand x5 with CGA and verbal cues for hand placement. Patient continues to rely heavily on UE support for all functional tasks involving LE demands. He returned to room in wc, requesting to return to bed. Bed alarm on, call light within reach.   Session 2: Patient received sitting up in wc, minimally agreeable to PT. He denies pain, but reports frustration at back- to -back session today between OT and PT. PT explaining that that was done with anticipation of family ed being completed, however patients family was still not present for scheduled family ed. Patient agreeable to complete NMR on NuStep x12 mins with multiple rest breaks due to fatigue. Marked LE weakness noted initially with limited ability for patient to move pedals on level 3. Once completed,  patient transferring back to wc and refused to participate in further therapy stating that he was too tired. PT unable to redirect patient toward even seated therapeutic tasks. Patient also refusing to propel himself back to his room in wc. Transferred back to bed per patient request with supervision via squat pivot. Bed alarm on, call light within reach.   Therapy Documentation Precautions:  Precautions Precautions: Fall Precaution Comments: Translator helpful but pt speaks pretty good English Restrictions Weight Bearing Restrictions: No    Therapy/Group: Individual Therapy  Elizebeth Koller, PT, DPT, CBIS  09/15/2020, 7:33 AM

## 2020-09-15 NOTE — Progress Notes (Signed)
Occupational Therapy Session Note  Patient Details  Name: Jacob Cameron MRN: [id number] Date of Birth: 07/12/1955  Today's Date: 09/15/2020 OT Individual Time: 4193-7902 OT Individual Time Calculation (min): 54 min    Short Term Goals: Week 2:  OT Short Term Goal 1 (Week 2): patient will complete toileting with min A OT Short Term Goal 2 (Week 2): Pt will donn his shoes and socks with supervision. OT Short Term Goal 3 (Week 2): Pt will complete toilet transfer with min guard assist using the RW for support. OT Short Term Goal 4 (Week 2): Pt will stand at the sink for 2 mins with close supervision for completion of 1-2 grooming tasks.  Skilled Therapeutic Interventions/Progress Updates:    Pt in bed to start session.  He completed supine to sit EOB with min guard assist for transfer to the wheelchair with the RW.  He was able to complete grooming tasks from the wheelchair with setup assist.  He was then able to propel his wheelchair down to the therapy gym with supervision and complete stand pivot transfer to the therapy mat with min assist.  He worked on [employer] strengthening with use of the resistive clothespins in sitting and with use of the LUE and RUE.  Increased difficulty noted with trying to pinch the green clothespins, but he was successful with pinching of the yellow and red ones.  Grip strength also tested at 19 lbs with the RUE and 10 lbs with the LUE.  Finished session with return to the wheelchair at min assist level and transfer back to the room.  He was left sitting up with the call button and phone in reach awaiting next session with PT.    Therapy Documentation Precautions:  Precautions Precautions: Fall Precaution Comments: Translator helpful but pt speaks pretty good English Restrictions Weight Bearing Restrictions: No  Pain: Pain Assessment Pain Scale: Faces Pain Score: 4  Pain Type: Neuropathic pain Pain Location: Foot Pain Orientation: Right;Left Pain Descriptors /  Indicators: Discomfort Pain Onset: With Activity Pain Intervention(s): Repositioned ADL: See Care Tool Section for some details of mobility and selfcare  Therapy/Group: Individual Therapy  Saranne Crislip OTR/L 09/15/2020, 3:40 PM

## 2020-09-15 NOTE — Progress Notes (Signed)
PROGRESS NOTE   Subjective/Complaints:  No issues overnight, discussed tingling is likely to continue for months   ROS: Patient denies CP, shortness of breath, nausea, vomiting, diarrhea.  Objective:   No results found. Recent Labs    09/14/20 0457  WBC 7.2  HGB 11.0*  HCT 33.2*  PLT 266   Recent Labs    09/14/20 0457  NA 136  K 3.8  CL 101  CO2 25  GLUCOSE 122*  BUN 8  CREATININE 0.91  CALCIUM 9.1    Intake/Output Summary (Last 24 hours) at 09/15/2020 0834 Last data filed at 09/15/2020 0700 Gross per 24 hour  Intake 200 ml  Output -  Net 200 ml        Physical Exam: Vital Signs Blood pressure 101/74, pulse 87, temperature 98.2 F (36.8 C), resp. rate 16, height 5\' 8"  (1.727 m), weight 67.7 kg, SpO2 97 %.  General: No acute distress Mood and affect are appropriate Heart: Regular rate and rhythm no rubs murmurs or extra sounds Lungs: Clear to auscultation, breathing unlabored, no rales or wheezes Abdomen: Positive bowel sounds, soft nontender to palpation, nondistended Extremities: No clubbing, cyanosis, or edema Skin: No evidence of breakdown, no evidence of rash   Musc: No edema in extremities.  No tenderness in extremities. Neurologic:  Alert Motor: Grossly 4/5 throughout   Assessment/Plan: 1. Functional deficits which require 3+ hours per day of interdisciplinary therapy in a comprehensive inpatient rehab setting.  Physiatrist is providing close team supervision and 24 hour management of active medical problems listed below.  Physiatrist and rehab team continue to assess barriers to discharge/monitor patient progress toward functional and medical goals  Care Tool:  Bathing    Body parts bathed by patient: Right arm,Left arm,Chest,Abdomen,Front perineal area,Buttocks,Right upper leg,Left upper leg,Face,Right lower leg,Left lower leg   Body parts bathed by helper: Right lower leg,Left lower  leg Body parts n/a: Front perineal area,Buttocks,Right upper leg,Left upper leg (did not attempt)   Bathing assist Assist Level: Minimal Assistance - Patient > 75%     Upper Body Dressing/Undressing Upper body dressing   What is the patient wearing?: Pull over shirt    Upper body assist Assist Level: Supervision/Verbal cueing    Lower Body Dressing/Undressing Lower body dressing    Lower body dressing activity did not occur: Refused What is the patient wearing?: Pants     Lower body assist Assist for lower body dressing: Minimal Assistance - Patient > 75%     Toileting Toileting Toileting Activity did not occur and hygiene only): Refused  Toileting assist Assist for toileting: Maximal Assistance - Patient 25 - 49%     Transfers Chair/bed transfer  Transfers assist  Chair/bed transfer activity did not occur: Refused  Chair/bed transfer assist level: Minimal Assistance - Patient > 75% Chair/bed transfer assistive device: Press photographer   Ambulation assist   Ambulation activity did not occur: Refused  Assist level: Minimal Assistance - Patient > 75% Assistive device: Walker-rolling Max distance: 115'   Walk 10 feet activity   Assist  Walk 10 feet activity did not occur: Refused  Assist level: Minimal Assistance - Patient > 75%  Assistive device: Walker-rolling   Walk 50 feet activity   Assist Walk 50 feet with 2 turns activity did not occur: Safety/medical concerns  Assist level: Minimal Assistance - Patient > 75% Assistive device: Walker-rolling    Walk 150 feet activity   Assist Walk 150 feet activity did not occur: Safety/medical concerns         Walk 10 feet on uneven surface  activity   Assist Walk 10 feet on uneven surfaces activity did not occur: Safety/medical concerns         Wheelchair     Assist Will patient use wheelchair at discharge?: Yes Type of Wheelchair: Manual     Wheelchair assist level: Supervision/Verbal cueing Max wheelchair distance: 150    Wheelchair 50 feet with 2 turns activity    Assist        Assist Level: Supervision/Verbal cueing   Wheelchair 150 feet activity     Assist      Assist Level: Supervision/Verbal cueing   Blood pressure 101/74, pulse 87, temperature 98.2 F (36.8 C), resp. rate 16, height 5\' 8"  (1.727 m), weight 67.7 kg, SpO2 97 %.    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 Antithrombotics: -DVT/anticoagulation:Pharmaceutical:Lovenox -antiplatelet therapy: N/A 3. Pain Management:  Per Dr 5/11 2/27- tried adding Duloxetine and increasing Gabapentin to 600 mg BID yesterday- have to stop Duloxetine made ill- will add to allergy list.    Increased gabapentin to 400mg  TID - now mainly with parasthesia not dyesthesias  Controlled on 3/8 4. Mood:LCSW to follow for evaluation and support. -antipsychotic agents: N/A 5. Neuropsych: This patientiscapable of making decisions on hisown behalf. 6. Skin/Wound Care:routine pressure relief measures. 7. Fluids/Electrolytes/Nutrition:Monitor I/Os  8. Dental pain:resolved --Educated on importance of oral care due to recessed gums. --Will order peridex bid 9. T2DM-new diagnosis: Hgb A1c-7.0--will consult dietician to educate on CM diet --Monitor BS ac/hs with SSI for elevated BS CBG (last 3)  Recent Labs    09/14/20 1626 09/14/20 2115 09/15/20 0624  GLUCAP 105* 101* 119*   Controlled 3/8 10. Vitamin B 12 deficiency: Being supplemented 11. Hyponatremia: Resolved. 12. Leucocytosis: Resolved   Likely reactive due to fever  steroids. No clear indication for augmentin  --Monitor for fevers and other signs of  infection. 13.  Diarrhea , likely abx associated with d/c and monitor   Patient improving 14.  Acute blood loss anemia  Hemoglobin 11.2 on 3/7  Continue to monitor  LOS: 14 days A FACE TO FACE EVALUATION WAS PERFORMED  11/15/20 09/15/2020, 8:34 AM

## 2020-09-15 NOTE — Plan of Care (Signed)
  Problem: Sit to Stand Goal: LTG:  Patient will perform sit to stand with assistance level (PT) Description: LTG:  Patient will perform sit to stand with assistance level (PT) Flowsheets (Taken 09/15/2020 1456) LTG: PT will perform sit to stand in preparation for functional mobility with assistance level: (downgraded due to patient progress) Contact Guard/Touching assist Note: downgraded due to patient progress   Problem: RH Car Transfers Goal: LTG Patient will perform car transfers with assist (PT) Description: LTG: Patient will perform car transfers with assistance (PT). Flowsheets (Taken 09/15/2020 1456) LTG: Pt will perform car transfers with assist:: (downgraded due to patient progress) Contact Guard/Touching assist Note: downgraded due to patient progress   Problem: RH Ambulation Goal: LTG Patient will ambulate in controlled environment (PT) Description: LTG: Patient will ambulate in a controlled environment, # of feet with assistance (PT). Flowsheets (Taken 09/15/2020 1456) LTG: Ambulation distance in controlled environment: (downgraded due to patient progress) 50 Note: downgraded due to patient progress Goal: LTG Patient will ambulate in home environment (PT) Description: LTG: Patient will ambulate in home environment, # of feet with assistance (PT). Flowsheets (Taken 09/15/2020 1456) LTG: Ambulation distance in home environment: (downgraded due to patient progress) 25 Note: downgraded due to patient progress

## 2020-09-15 NOTE — Progress Notes (Signed)
Nutrition Follow-up  DOCUMENTATION CODES:   Not applicable  INTERVENTION:   - Continue Ensure Enlive po TID, each supplement provides 350 kcal and 20 grams of protein  - Continue MVI with minerals daily  - Encourage adequate PO intake  - Continue Vegetarian diet order  NUTRITION DIAGNOSIS:   Increased nutrient needs related to other (extensive therapies) as evidenced by estimated needs.  Ongoing  GOAL:   Patient will meet greater than or equal to 90% of their needs  Progressing  MONITOR:   PO intake,Supplement acceptance,Weight trends,Labs  REASON FOR ASSESSMENT:   Other (RN reports pt having difficulty finding foods to eat)    ASSESSMENT:   65 year old male admitted on 08/12/20 with bilateral lower extremity and right upper extremity paresthesias with shooting pains with BLE weakness. Pt felt to have GBS and was treated with IVIG 5/5 with some improvement in LE paraesthesias of symptoms. Pt continued to have issues with pain, nausea, and progressive symptoms therefore plasmapheresis recommended and patient/family agreeable. Dialysis catheter placed on 2/9 and pt completed 3 doses with minimal improvement. High dose IV steroids added 2/17 due to suspicion of GBS variant with 2 additional doses of PLEX. Admitted to CIR on 09/01/20.  Noted target discharge date of 3/11.  Discussed pt with RN who reports pt is consuming Ensure supplements and is eating much better. RN reports Vegetarian diet order has provided pt with more options and someone is helping him order meals so that he is able to receive items that he enjoys.  Spoke with pt at bedside who confirms the above. Pt reports appetite is good and that he is eating well. He endorses consuming the Ensure supplements and states that he likes them. Will continue with current nutrition interventions.  Question accuracy of today's weight (149.25 lbs) given 9 lb weight loss in 1 day. Yesterday's weight charted as 158.95 lbs.  Prior to today, pt had been gaining weight steadily since lowest weight of 154.54 lbs on 2/28.  Meal Completion: 50-100% x last 8 meals  Medications reviewed and include: Ensure Enlive TID, SSI, MVI with minerals, protonix, miralax  Labs reviewed. CBG's: 101-119 x 24 hours  Diet Order:   Diet Order            Diet vegetarian Room service appropriate? Yes; Fluid consistency: Thin; Fluid restriction: 1800 mL Fluid  Diet effective now                 EDUCATION NEEDS:   Education needs have been addressed  Skin:  Skin Assessment: Reviewed RN Assessment  Last BM:  09/14/20 type 7  Height:   Ht Readings from Last 1 Encounters:  09/01/20 5\' 8"  (1.727 m)    Weight:   Wt Readings from Last 1 Encounters:  09/15/20 67.7 kg    BMI:  Body mass index is 22.69 kg/m.  Estimated Nutritional Needs:   Kcal:  2000-2200  Protein:  100-115 grams  Fluid:  2.0-2.2 L    11/15/20, MS, RD, LDN Inpatient Clinical Dietitian Please see [facility] for contact information.

## 2020-09-15 NOTE — Plan of Care (Signed)
  Problem: RH Balance Goal: LTG Patient will maintain dynamic standing with ADLs (OT) Description: LTG:  Patient will maintain dynamic standing balance with assist during activities of daily living (OT)  Flowsheets (Taken 09/15/2020 1323) LTG: Pt will maintain dynamic standing balance during ADLs with: (goal downgraded based on progress) Contact Guard/Touching assist Note: goal downgraded based on progress   Problem: RH Bathing Goal: LTG Patient will bathe all body parts with assist levels (OT) Description: LTG: Patient will bathe all body parts with assist levels (OT) Flowsheets (Taken 09/15/2020 1323) LTG: Pt will perform bathing with assistance level/cueing: (goal downgraded based on progress) Minimal Assistance - Patient > 75% LTG: Position pt will perform bathing:  Shower  Sit to Stand Note: goal downgraded based on progress   Problem: RH Dressing Goal: LTG Patient will perform lower body dressing w/assist (OT) Description: LTG: Patient will perform lower body dressing with assist, with/without cues in positioning using equipment (OT) Flowsheets (Taken 09/15/2020 1323) LTG: Pt will perform lower body dressing with assistance level of: (goal downgraded based on progress) Minimal Assistance - Patient > 75% Note: goal downgraded based on progress   Problem: RH Toileting Goal: LTG Patient will perform toileting task (3/3 steps) with assistance level (OT) Description: LTG: Patient will perform toileting task (3/3 steps) with assistance level (OT)  Flowsheets (Taken 09/15/2020 1323) LTG: Pt will perform toileting task (3/3 steps) with assistance level: (goal downgraded based on progress) Contact Guard/Touching assist Note: goal downgraded based on progress   Problem: RH Toilet Transfers Goal: LTG Patient will perform toilet transfers w/assist (OT) Description: LTG: Patient will perform toilet transfers with assist, with/without cues using equipment (OT) Flowsheets (Taken 09/15/2020 1323) LTG:  Pt will perform toilet transfers with assistance level of: (goal downgraded based on progress) Contact Guard/Touching assist Note: goal downgraded based on progress

## 2020-09-16 LAB — GLUCOSE, CAPILLARY
Glucose-Capillary: 145 mg/dL — ABNORMAL HIGH (ref 70–99)
Glucose-Capillary: 127 mg/dL — ABNORMAL HIGH (ref 70–99)
Glucose-Capillary: 107 mg/dL — ABNORMAL HIGH (ref 70–99)
Glucose-Capillary: 131 mg/dL — ABNORMAL HIGH (ref 70–99)

## 2020-09-16 NOTE — Progress Notes (Signed)
Patient ID: Jacob Cameron, male   DOB: Jun 17, 1956, 65 y.o.   MRN: [id number] Team Conference Report to Patient/Family  Team Conference discussion was reviewed with the patient and caregiver, including goals, any changes in plan of care and target discharge date.  Patient and caregiver express understanding and are in agreement.  The patient has a target discharge date of 09/18/20.  Andria Rhein 09/16/2020, 1:08 PM

## 2020-09-16 NOTE — Progress Notes (Signed)
Physical Therapy Session Note  Patient Details  Name: Jacob Cameron MRN: [id number] Date of Birth: 1956-03-12  Today's Date: 09/16/2020 PT Individual Time: 0905-1003 PT Individual Time Calculation (min): 58 min   Short Term Goals: Week 2:  PT Short Term Goal 1 (Week 2): = to LTGs based on ELOS  Skilled Therapeutic Interventions/Progress Updates:  Pt was received supine in bed and transferred to sit independent. He was agreeable to therapy session and did not report any pain. He was transferred bed to [facility] with supervision and wheeled to the therapy gym. Supervision WC to mat transfers (using blocked practice) at a raised mat were performed to assure safety with transfer ability at home. PT instructing patient on managing wc components to ensure safe transfers. Straight walking and cone weaving "zig zags" with a RW (3 rounds each exercise, 20 ft one way and 20 ft back) were performed CGA to mimic household distances and turns the patient will encounter at home. Pt was educated that any walking done at home should only be performed when one of his sons is present to guard him in case of falls and a gait belt should be used for safety. Kinetron was performed at the end of the session 3x2' at 20cm/s resistance to improve endurance. Pt was wheeled back to his room and left in sitting in his WC, seatbelt alarm on, call light within reach.   Therapy Documentation Precautions:  Precautions Precautions: Fall Precaution Comments: Translator helpful but pt speaks pretty good English Restrictions Weight Bearing Restrictions: No    Therapy/Group: Individual Therapy  Elizebeth Koller, PT, DPT, CBIS  09/16/2020, 7:28 AM

## 2020-09-16 NOTE — Progress Notes (Signed)
Patient ID: Jacob Cameron, male   DOB: April 02, 1956, 65 y.o.   MRN: [id number]   Patient DME will be delivered to patients home per patient/family request.  Lavera Guise, BSW [phone]

## 2020-09-16 NOTE — Progress Notes (Signed)
Occupational Therapy Session Note  Patient Details  Name: Jacob Cameron MRN: [id number] Date of Birth: 12-06-1955  Today's Date: 09/16/2020 OT Individual Time: 1015-1108 OT Individual Time Calculation (min): 53 min    Short Term Goals: Week 2:  OT Short Term Goal 1 (Week 2): patient will complete toileting with min A OT Short Term Goal 2 (Week 2): Pt will donn his shoes and socks with supervision. OT Short Term Goal 3 (Week 2): Pt will complete toilet transfer with min guard assist using the RW for support. OT Short Term Goal 4 (Week 2): Pt will stand at the sink for 2 mins with close supervision for completion of 1-2 grooming tasks.  Skilled Therapeutic Interventions/Progress Updates:    Pt received in w/c with no c/o pain. Pt agreeable to OT session. He completed oral care at the sink with set up assist. UB bathing and dressing with set up assist. Pt requested to not leave the room d/t fatigue from previous PT session. Pt completed BUE strengthening circuit with 3 lb dumbbells. He completed 3x10 of shoulder press, bicep curls and forward raises to increase BUE strength needed to participate fully in ADLs/iADLs. Cueing required for reducing swinging/trunk compensations to isolate proper muscle being targeted. Pt completed stand pivot transfer to the [facility] over toilet with close supervision. Min A for clothing management. Pt returned to supine in bed with CGA for stand pivot transfer. Bed alarm set, all needs met.  Therapy Documentation Precautions:  Precautions Precautions: Fall Precaution Comments: Translator helpful but pt speaks pretty good English Restrictions Weight Bearing Restrictions: No  Therapy/Group: Individual Therapy  Curtis Sites 09/16/2020, 6:37 AM

## 2020-09-16 NOTE — Progress Notes (Signed)
PROGRESS NOTE   Subjective/Complaints:  Pt concerned about equipment being delivered to home (his preference ) rather than to hospital , has small car   ROS: Patient denies CP, shortness of breath, nausea, vomiting, diarrhea.  Objective:   No results found. Recent Labs    09/14/20 0457  WBC 7.2  HGB 11.0*  HCT 33.2*  PLT 266   Recent Labs    09/14/20 0457  NA 136  K 3.8  CL 101  CO2 25  GLUCOSE 122*  BUN 8  CREATININE 0.91  CALCIUM 9.1    Intake/Output Summary (Last 24 hours) at 09/16/2020 0816 Last data filed at 09/15/2020 1815 Gross per 24 hour  Intake 400 ml  Output --  Net 400 ml        Physical Exam: Vital Signs Blood pressure 90/63, pulse 88, temperature 98.7 F (37.1 C), resp. rate 16, height $RemoveBe'5\' 8"'vYvhabrDd$  (1.727 m), weight 66.6 kg, SpO2 95 %.  General: No acute distress Mood and affect are appropriate Heart: Regular rate and rhythm no rubs murmurs or extra sounds Lungs: Clear to auscultation, breathing unlabored, no rales or wheezes Abdomen: Positive bowel sounds, soft nontender to palpation, nondistended Extremities: No clubbing, cyanosis, or edema Skin: No evidence of breakdown, no evidence of rash Musc: No edema in extremities.  No tenderness in extremities. Neurologic:  Alert Motor: Grossly 4/5 throughout   Assessment/Plan: 1. Functional deficits which require 3+ hours per day of interdisciplinary therapy in a comprehensive inpatient rehab setting.  Physiatrist is providing close team supervision and 24 hour management of active medical problems listed below.  Physiatrist and rehab team continue to assess barriers to discharge/monitor patient progress toward functional and medical goals  Care Tool:  Bathing    Body parts bathed by patient: Right arm,Left arm,Chest,Abdomen,Front perineal area,Buttocks,Right upper leg,Left upper leg,Face,Right lower leg,Left lower leg   Body parts bathed by  helper: Right lower leg,Left lower leg Body parts n/a: Front perineal area,Buttocks,Right upper leg,Left upper leg (did not attempt)   Bathing assist Assist Level: Minimal Assistance - Patient > 75%     Upper Body Dressing/Undressing Upper body dressing   What is the patient wearing?: Pull over shirt    Upper body assist Assist Level: Supervision/Verbal cueing    Lower Body Dressing/Undressing Lower body dressing    Lower body dressing activity did not occur: Refused What is the patient wearing?: Pants     Lower body assist Assist for lower body dressing: Minimal Assistance - Patient > 75%     Toileting Toileting Toileting Activity did not occur Landscape architect and hygiene only): Refused  Toileting assist Assist for toileting: Maximal Assistance - Patient 25 - 49%     Transfers Chair/bed transfer  Transfers assist  Chair/bed transfer activity did not occur: Refused  Chair/bed transfer assist level: Supervision/Verbal cueing Chair/bed transfer assistive device:  (squat pivot)   Locomotion Ambulation   Ambulation assist   Ambulation activity did not occur: Refused  Assist level: Minimal Assistance - Patient > 75% Assistive device: Walker-rolling Max distance: 115'   Walk 10 feet activity   Assist  Walk 10 feet activity did not occur: Refused  Assist level: Minimal Assistance -  Patient > 75% Assistive device: Walker-rolling   Walk 50 feet activity   Assist Walk 50 feet with 2 turns activity did not occur: Safety/medical concerns  Assist level: Minimal Assistance - Patient > 75% Assistive device: Walker-rolling    Walk 150 feet activity   Assist Walk 150 feet activity did not occur: Safety/medical concerns         Walk 10 feet on uneven surface  activity   Assist Walk 10 feet on uneven surfaces activity did not occur: Safety/medical concerns         Wheelchair     Assist Will patient use wheelchair at discharge?: Yes Type  of Wheelchair: Manual    Wheelchair assist level: Independent Max wheelchair distance: 150    Wheelchair 50 feet with 2 turns activity    Assist        Assist Level: Independent   Wheelchair 150 feet activity     Assist      Assist Level: Independent   Blood pressure 90/63, pulse 88, temperature 98.7 F (37.1 C), resp. rate 16, height $RemoveBe'5\' 8"'filnnMizj$  (1.727 m), weight 66.6 kg, SpO2 95 %.    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 Formulized individual treatment plan based on medical history, underlying problem and comorbidities. , d/c 3/11 2. Antithrombotics: -DVT/anticoagulation:Pharmaceutical:Lovenox, d/c in am  -antiplatelet therapy: N/A 3. Pain Management:  Per Dr Dagoberto Ligas 2/27- tried adding Duloxetine and increasing Gabapentin to 600 mg BID yesterday- have to stop Duloxetine made ill- will add to allergy list.    Increased gabapentin to $RemoveBefor'400mg'XhNRIGzfzQNC$  TID - now mainly with parasthesia not dyesthesias  Controlled on 3/9 4. Mood:LCSW to follow for evaluation and support. -antipsychotic agents: N/A 5. Neuropsych: This patientiscapable of making decisions on hisown behalf. 6. Skin/Wound Care:routine pressure relief measures. 7. Fluids/Electrolytes/Nutrition:Monitor I/Os  8. Dental pain:resolved --Educated on importance of oral care due to recessed gums. --Will order peridex bid 9. T2DM-new diagnosis: Hgb A1c-7.0--will consult dietician to educate on CM diet --Monitor BS ac/hs with SSI for elevated BS CBG (last 3)  Recent Labs    09/15/20 1637 09/15/20 2059 09/16/20 0616  GLUCAP 100* 136* 145*   Controlled 3/9 10. Vitamin B 12 deficiency:  Being supplemented 11. Hyponatremia: Resolved. 12. Leucocytosis: Resolved   Likely reactive due to fever  steroids. No clear indication for augmentin  --Monitor for fevers and other signs of infection. 13.  Diarrhea , likely abx associated with d/c and monitor   Patient improving 14.  Acute blood loss anemia  Hemoglobin 11.2 on 3/7  Continue to monitor  LOS: 15 days A FACE TO FACE EVALUATION WAS PERFORMED  Charlett Blake 09/16/2020, 8:16 AM

## 2020-09-16 NOTE — Patient Care Conference (Signed)
Inpatient RehabilitationTeam Conference and Plan of Care Update Date: 09/16/2020   Time: 10:41 AM    Patient Name: Jacob Cameron      Medical Record Number: [id number]  Date of Birth: [birth year] Sex: Male         Room/Bed: 4W09C/4W09C-01 Payor Info: Payor: MEDICARE / Plan: MEDICARE PART A AND B / Product Type: *No Product type* /    Admit Date/Time:  09/01/2020  4:08 PM  Primary Diagnosis:  Axonal polyneuropathy  Hospital Problems: Principal Problem:   Axonal polyneuropathy Active Problems:   Neuropathic pain   Acute blood loss anemia   Diarrhea   Diabetes mellitus, new onset [facility])    Expected Discharge Date: Expected Discharge Date: 09/18/20  Team Members Present: Physician leading conference: Dr. Claudette Laws Care Coodinator Present: Lavera Guise, BSW;Lyonel Morejon Lambert Mody, RN, BSN, CRRN Nurse Present: Margot Ables, LPN PT Present: Merry Lofty, PT OT Present: Perrin Maltese, OT PPS Coordinator present : Fae Pippin, [place], PT     Current Status/Progress Goal Weekly Team Focus  Bowel/Bladder   Pt is continent x2.  Pt will remain continent x2.  Assess q shift and prn.   Swallow/Nutrition/ Hydration             ADL's   Supervision for UB selfcare with min assist for LB selfcare.  Min guard to min assist for transfers with use of the RW.  Decreased bilateral hand strength and coordination with the right being slightly mor affected than the left.  supervision overall to min assist  selfcare retaining, balance retraining, transfer training DME education, therapeutic exercise, pt/family education   Mobility   Independent bed mobility, CGA/MinA STS, supervision squat pivot transfers or CGA stand pivot with RW, CGA gait up to 16ft with RW, impaired activity tolerance and significant B LE weakness, family has not been present for fam ed  spv/CGA (downgraded gait goals due to limited endurance and patient progress thus far)  NMR B LE, activity tolerance, dc  planning, pt/fam ed   Communication             Safety/Cognition/ Behavioral Observations            Pain   Pt reports pain is controlled with medication.  Pain will remain <3.  Assess q shift and prn.   Skin   Pt has bruising at IJ site.  Skin will continue to heal and remain infection free.  Assess q shift and prn.     Discharge Planning:  Goal to discharge home with 2 sons and spouse to provide care 24/7   Team Discussion: Paraesthesias continue. BP stable.  Patient on target to meet rehab goals: yes, supervision for upper body care and min assist for lower body care. Min assist for transfers to [facility] and shower. Supervision for squat pivot transfers and able to ambulate 34' with CGA. Min guard assist goals set for discharge.  *See Care Plan and progress notes for long and short-term goals.   Revisions to Treatment Plan:  OT downgraded goals Anticipate w/c level at discharge  Teaching Needs: Transfers, toileting, medications, etc.  Current Barriers to Discharge: Decreased caregiver support  Possible Resolutions to Barriers: Family education Reschedule family education sessions     Medical Summary Current Status: Patient indicates mainly having paresthesias rather than dysesthesias, blood pressure a little soft  Barriers to Discharge: Decreased family/caregiver support   Possible Resolutions to [employer] Focus: Discontinue oxycodone, family training with wife who is able to provide very limited   Continued  Need for Acute Rehabilitation Level of Care: The patient requires daily medical management by a physician with specialized training in physical medicine and rehabilitation for the following reasons: Direction of a multidisciplinary physical rehabilitation program to maximize functional independence : Yes Medical management of patient stability for increased activity during participation in an intensive rehabilitation regime.: Yes Analysis of laboratory values  and/or radiology reports with any subsequent need for medication adjustment and/or medical intervention. : Yes   I attest that I was present, lead the team conference, and concur with the assessment and plan of the team.   Chana Bode B 09/16/2020, 2:53 PM

## 2020-09-16 NOTE — Progress Notes (Signed)
Occupational Therapy Session Note  Patient Details  Name: Jacob Cameron MRN: [id number] Date of Birth: May 18, 1956  Today's Date: 09/16/2020 OT Individual Time: 6160-7371 OT Individual Time Calculation (min): 54 min    Short Term Goals: Week 2:  OT Short Term Goal 1 (Week 2): patient will complete toileting with min A OT Short Term Goal 2 (Week 2): Pt will donn his shoes and socks with supervision. OT Short Term Goal 3 (Week 2): Pt will complete toilet transfer with min guard assist using the RW for support. OT Short Term Goal 4 (Week 2): Pt will stand at the sink for 2 mins with close supervision for completion of 1-2 grooming tasks.  Skilled Therapeutic Interventions/Progress Updates:    Pt in bed to start session, he was able to transfer to the EOB with supervision.  He then worked on donning his shoes with mod assist including tying them.  Min assist for transfer from the lower EOB to the wheelchair.  He was able to then roll himself down to the therapy gym with supervision in order to practice walk-in shower transfers.  He was able to complete transfer with both the tub bench as well as the smaller shower seat.  Min assist for transfer with the tub bench overall using the RW.  He needed mod assist to complete transfer stepping into the shower with use of the walker and a smaller shower seat secondary to demonstrating increased difficulty standing up from the shower seat.  Discussed with pt and he wants to purchase a shower bench instead of the seat.  SW made aware and they are ordering it for him.  Finished session with return to the room via wheelchair and pt propelling it.  He was able to finish session with transfer to the bed with min assist.  He was able to remove his shoes with supervision and transition to supine.  Call button and phone in reach with safety alarm in place.    Therapy Documentation Precautions:  Precautions Precautions: Fall Precaution Comments: Translator helpful but pt  speaks pretty good English Restrictions Weight Bearing Restrictions: No  Pain: Pain Assessment Pain Scale: 0-10 Pain Score: 4  Pain Type: Neuropathic pain Pain Location: Foot Pain Orientation: Right;Left Pain Descriptors / Indicators: Discomfort Pain Onset: With Activity Pain Intervention(s): Repositioned ADL: See Care Tool Section for some details of mobility and selfcare  Therapy/Group: Individual Therapy  MCGUIRE,JAMES OTR/L 09/16/2020, 4:12 PM

## 2020-09-17 LAB — GLUCOSE, CAPILLARY
Glucose-Capillary: 145 mg/dL — ABNORMAL HIGH (ref 70–99)
Glucose-Capillary: 113 mg/dL — ABNORMAL HIGH (ref 70–99)
Glucose-Capillary: 98 mg/dL (ref 70–99)
Glucose-Capillary: 158 mg/dL — ABNORMAL HIGH (ref 70–99)

## 2020-09-17 MED ORDER — HYDROCERIN EX CREA
TOPICAL_CREAM | Freq: Two times a day (BID) | CUTANEOUS | Status: DC
  Filled 2020-09-17: qty 113

## 2020-09-17 MED ORDER — HYDROCERIN EX CREA
TOPICAL_CREAM | Freq: Two times a day (BID) | CUTANEOUS | Status: DC
  Administered 2020-09-18: 1 via TOPICAL
  Filled 2020-09-17 (×2): qty 113

## 2020-09-17 NOTE — Progress Notes (Signed)
PROGRESS NOTE   Subjective/Complaints:  Has dry feet , would like me to rec moisturizer  ROS: Patient denies CP, shortness of breath, nausea, vomiting, diarrhea.  Objective:   No results found. No results for input(s): WBC, HGB, HCT, PLT in the last 72 hours. No results for input(s): NA, K, CL, CO2, GLUCOSE, BUN, CREATININE, CALCIUM in the last 72 hours.  Intake/Output Summary (Last 24 hours) at 09/17/2020 0834 Last data filed at 09/16/2020 1741 Gross per 24 hour  Intake 600 ml  Output --  Net 600 ml        Physical Exam: Vital Signs Blood pressure (!) 90/59, pulse 94, temperature 98.5 F (36.9 C), resp. rate 14, height 5\' 8"  (1.727 m), weight 71 kg, SpO2 94 %.  General: No acute distress Mood and affect are appropriate Heart: Regular rate and rhythm no rubs murmurs or extra sounds Lungs: Clear to auscultation, breathing unlabored, no rales or wheezes Abdomen: Positive bowel sounds, soft nontender to palpation, nondistended Extremities: No clubbing, cyanosis, or edema Skin: No evidence of breakdown, no evidence of rash   Neurologic:  Alert Motor: Grossly 4/5 throughout   Assessment/Plan: 1. Functional deficits which require 3+ hours per day of interdisciplinary therapy in a comprehensive inpatient rehab setting.  Physiatrist is providing close team supervision and 24 hour management of active medical problems listed below.  Physiatrist and rehab team continue to assess barriers to discharge/monitor patient progress toward functional and medical goals  Care Tool:  Bathing    Body parts bathed by patient: Right arm,Left arm,Chest,Abdomen,Front perineal area,Buttocks,Right upper leg,Left upper leg,Face,Right lower leg,Left lower leg   Body parts bathed by helper: Right lower leg,Left lower leg Body parts n/a: Front perineal area,Buttocks,Right upper leg,Left upper leg (did not attempt)   Bathing assist Assist  Level: Minimal Assistance - Patient > 75%     Upper Body Dressing/Undressing Upper body dressing   What is the patient wearing?: Pull over shirt    Upper body assist Assist Level: Supervision/Verbal cueing    Lower Body Dressing/Undressing Lower body dressing    Lower body dressing activity did not occur: Refused What is the patient wearing?: Pants     Lower body assist Assist for lower body dressing: Minimal Assistance - Patient > 75%     Toileting Toileting Toileting Activity did not occur and hygiene only): Refused  Toileting assist Assist for toileting: Minimal Assistance - Patient > 75%     Transfers Chair/bed transfer  Transfers assist  Chair/bed transfer activity did not occur: Refused  Chair/bed transfer assist level: Contact Guard/Touching assist Chair/bed transfer assistive device: Walker,Armrests   Locomotion Ambulation   Ambulation assist   Ambulation activity did not occur: Refused  Assist level: Contact Guard/Touching assist Assistive device: Walker-rolling Max distance: 10'   Walk 10 feet activity   Assist  Walk 10 feet activity did not occur: Refused  Assist level: Contact Guard/Touching assist Assistive device: Walker-rolling   Walk 50 feet activity   Assist Walk 50 feet with 2 turns activity did not occur: Safety/medical concerns  Assist level: Minimal Assistance - Patient > 75% Assistive device: Walker-rolling    Walk 150 feet activity  Assist Walk 150 feet activity did not occur: Safety/medical concerns         Walk 10 feet on uneven surface  activity   Assist Walk 10 feet on uneven surfaces activity did not occur: Safety/medical concerns         Wheelchair     Assist Will patient use wheelchair at discharge?: Yes Type of Wheelchair: Manual    Wheelchair assist level: Independent Max wheelchair distance: 150    Wheelchair 50 feet with 2 turns activity    Assist         Assist Level: Independent   Wheelchair 150 feet activity     Assist      Assist Level: Independent   Blood pressure (!) 90/59, pulse 94, temperature 98.5 F (36.9 C), resp. rate 14, height 5\' 8"  (1.727 m), weight 71 kg, SpO2 94 %.    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 Antithrombotics: -DVT/anticoagulation:Pharmaceutical:Lovenox, d/c in am  -antiplatelet therapy: N/A 3. Pain Management:  Per Dr 5/11 2/27- tried adding Duloxetine and increasing Gabapentin to 600 mg BID yesterday- have to stop Duloxetine made ill- will add to allergy list.    Increased gabapentin to 400mg  TID - now mainly with parasthesia not dyesthesias  Controlled on 3/10 4. Mood:LCSW to follow for evaluation and support. -antipsychotic agents: N/A 5. Neuropsych: This patientiscapable of making decisions on hisown behalf. 6. Skin/Wound Care:routine pressure relief measures.c/o dry skin on feet order eucerin BID 7. Fluids/Electrolytes/Nutrition:Monitor I/Os  8. Dental pain:resolved --Educated on importance of oral care due to recessed gums. --Will order peridex bid 9. T2DM-new diagnosis: Hgb A1c-7.0--will consult dietician to educate on CM diet --Monitor BS ac/hs with SSI for elevated BS CBG (last 3)  Recent Labs    09/16/20 1627 09/16/20 2119 09/17/20 0625  GLUCAP 107* 131* 145*   Controlled 3/10 10. Vitamin B 12 deficiency: Being supplemented 11. Hyponatremia: Resolved. 12. Leucocytosis: Resolved   Likely reactive due to fever  steroids. No clear indication for augmentin  --Monitor for fevers and other signs of infection. 13.  Diarrhea , likely abx associated with d/c and monitor   Patient improving 14.  Acute blood loss anemia  Hemoglobin 11.2 on  3/7  Continue to monitor  LOS: 16 days A FACE TO FACE EVALUATION WAS PERFORMED  11/17/20 09/17/2020, 8:34 AM

## 2020-09-17 NOTE — Progress Notes (Signed)
Physical Therapy Discharge Summary  Patient Details  Name: Jacob Cameron MRN: [id number] Date of Birth: 1955/11/04  Today's Date: 09/17/2020 PT Individual Time: 1000-1058; 3299-2426 PT Individual Time Calculation (min): 58 min and 25 mins  Session 1: Patient received sitting up in bed, agreeable to PT. He denies pain. He was able to come sit edge of bed independently and transfer to wc via squat pivot with supervision. He was able to propel himself to therapy gym ModI. He ambulated 80ft with RW and CGA with slow gait speed and maintained heavy reliance on B UE and decreased B LE flexion through gait cycle. Patient completing dc assessment as noted below. He completed 10 mins on NuStep with B UE/LE for improved endurance. He returned to room in wc, transferring to bed per his request. Bed alarm on, call light within reach.   Session 2: Patient received sitting up in bed, agreeable to PT. He denies pain. Patient able to come sit edge of bed independently and don shoes with MinA. He transferred to wc via squat pivot with supervision and propelled himself in wc to therapy gym ModI. He completed dynamic standing balance task with U UE support transferring rings back and forth 12x4. Verbal cues needed to not lock out knees and allow for soft flexion throughout task. Patient ambulating 61ft x2 with RW and CGA, seated rest break between. He propelled himself in wc back to her room transferring to bed via squat pivot with supervision. Bed alarm on, call light within reach.   Patient has met 10 of 10 long term goals due to improved activity tolerance, improved balance, increased strength, decreased pain, ability to compensate for deficits and improved coordination.  Patient to discharge at a wheelchair level Supervision.   Patient's care partner is independent to provide the necessary physical assistance at discharge.   Recommendation:  Patient will benefit from ongoing skilled PT services in outpatient setting to  continue to advance safe functional mobility, address ongoing impairments in endurance, gait progressions, dynamic balance, and minimize fall risk.  Equipment: RW, standard wc (leg rests, anti tippers, cushion)  Reasons for discharge: treatment goals met and discharge from hospital  Patient/family agrees with progress made and goals achieved: Yes  PT Discharge Precautions/Restrictions Precautions Precautions: Fall Precaution Comments: Translator helpful but pt speaks pretty good English Restrictions Weight Bearing Restrictions: No Vision/Perception  Perception Perception: Within Functional Limits Praxis Praxis: Intact  Cognition Overall Cognitive Status: Within Functional Limits for tasks assessed Arousal/Alertness: Awake/alert Orientation Level: Oriented X4 Attention: Focused;Sustained Focused Attention: Appears intact Sustained Attention: Appears intact Memory: Appears intact Awareness: Appears intact Problem Solving: Appears intact Safety/Judgment: Appears intact Sensation Sensation Light Touch: Impaired Detail Peripheral sensation comments: N/T in distal 4 extremities Light Touch Impaired Details: Impaired RUE;Impaired LUE (tingling in hands but able to detect light touch) Hot/Cold: Appears Intact Proprioception: Impaired by gross assessment Proprioception Impaired Details: Impaired RLE;Impaired LLE Stereognosis: Appears Intact Additional Comments: Bilateral sensation in arms WFLS, he does report tingling in his hands, but can detect light touch Coordination Gross Motor Movements are Fluid and Coordinated: No Fine Motor Movements are Fluid and Coordinated: No Coordination and Movement Description: Pt still with decreased FM coordination bilaterally as well as decreased hand strength.  Dysmetria noted with finger to nose test in the right hand. Heel Shin Test: WFL B LE Motor  Motor Motor: Ataxia Motor - Discharge Observations: Weakness in bilateral hands and LEs  still present but improved since eval.  Mobility Bed Mobility Bed Mobility: Rolling Right;Rolling  Left;Supine to Sit;Sit to Supine Rolling Right: Independent Rolling Left: Independent Supine to Sit: Independent Sit to Supine: Independent Transfers Transfers: Sit to Stand;Stand to Sit;Stand Pivot Transfers;Squat Pivot Transfers Sit to Stand: Contact Guard/Touching assist Stand to Sit: Contact Guard/Touching assist Stand Pivot Transfers: Contact Guard/Touching assist Squat Pivot Transfers: Supervision/Verbal cueing Transfer (Assistive device): Rolling walker Locomotion  Gait Ambulation: Yes Gait Assistance: Contact Guard/Touching assist Gait Distance (Feet): 90 Feet Assistive device: Rolling walker Gait Gait: Yes Gait Pattern: Impaired Gait Pattern: Decreased hip/knee flexion - left;Decreased hip/knee flexion - right;Narrow base of support;Poor foot clearance - right;Poor foot clearance - left;Left foot flat;Right foot flat Gait velocity: reduced  Trunk/Postural Assessment  Cervical Assessment Cervical Assessment: Within Functional Limits Thoracic Assessment Thoracic Assessment: Exceptions to [facility] (increased kyphosis) Lumbar Assessment Lumbar Assessment: Exceptions to [facility] (posterior pelvic tilt) Postural Control Postural Control: Deficits on evaluation Righting Reactions: delayed and inadequate Postural Limitations: limited ability to reach outside BOS while standing  Balance Balance Balance Assessed: Yes Static Sitting Balance Static Sitting - Balance Support: Feet supported Static Sitting - Level of Assistance: 7: Independent Dynamic Sitting Balance Dynamic Sitting - Balance Support: During functional activity Dynamic Sitting - Level of Assistance: 7: Independent Static Standing Balance Static Standing - Balance Support: Bilateral upper extremity supported;During functional activity Static Standing - Level of Assistance: 5: Stand by assistance Dynamic Standing  Balance Dynamic Standing - Balance Support: During functional activity Dynamic Standing - Level of Assistance: 4: Min assist Dynamic Standing - Balance Activities: Reaching across midline;Reaching for objects Extremity Assessment  RUE Assessment RUE Assessment: Exceptions to [facility] Passive Range of Motion (PROM) Comments: WFLS for all joints Active Range of Motion (AROM) Comments: WFLS for all joints General Strength Comments: 4/5 in shoulder flexion as well as elbow flexion/extension. Only 3+/5 grip strength grossly.  Can oppose thumb to all digits with decreased ability to adduct fingers. LUE Assessment LUE Assessment: Exceptions to [facility] Passive Range of Motion (PROM) Comments: WFLS Active Range of Motion (AROM) Comments: WFLS General Strength Comments: Shoulder flexion as well as elbow flexion/extension 4+/5, grip 3+/5 but slightly stronger than right hand.  Decreased ability to oppose thumb to the 5th digit, but can adduct and abduct digits. RLE Assessment RLE Assessment: Exceptions to [facility] Active Range of Motion (AROM) Comments: WFL RLE Strength Right Hip Flexion: 4/5 Right Knee Flexion: 4-/5 Right Knee Extension: 4-/5 Right Ankle Dorsiflexion: 3+/5 Right Ankle Plantar Flexion: 3+/5 LLE Assessment LLE Assessment: Exceptions to [facility] Active Range of Motion (AROM) Comments: WFL LLE Strength Left Hip Flexion: 4/5 Left Knee Flexion: 3+/5 Left Knee Extension: 4/5 Left Ankle Dorsiflexion: 3+/5 Left Ankle Plantar Flexion: 3+/5  Family was not present for scheduled family education prior to dc. They were unable to demonstrate safety and competency with assisting patient with transfers and gait. This PT advised patient to only ambulate short distances with sons present and not with his wife as he reports that his wife cannot provide any physical assist. Sons are reportedly out of the house during business hours, but present at night. Patient verbalized understanding to be at wc level  throughout day when only wife is home.   Debbora Dus 09/17/2020, 10:34 AM

## 2020-09-17 NOTE — Progress Notes (Signed)
Inpatient Rehabilitation Care Coordinator Discharge Note  The overall goal for the admission was met for:   Discharge location: Yes, home  Length of Stay: Yes, 16 Days  Discharge activity level: Yes  Home/community participation: Yes  Services provided included: MD, RD, PT, OT, SLP, RN, CM, TR, Pharmacy, [place]: Medicare  Choices offered to/list presented [id number]   Follow-up services arranged: Home Health: [facility] (or additional information):  Patient/Family verbalized understanding of follow-up arrangements: Yes  Individual responsible for coordination of the follow-up plan: Georgiana Spinner  Confirmed correct DME delivered: Dyanne Iha 09/17/2020    Dyanne Iha

## 2020-09-17 NOTE — Progress Notes (Signed)
Occupational Therapy Discharge Summary  Patient Details  Name: Nafis Farnan MRN: [id number] Date of Birth: 1956-05-04  Today's Date: 09/17/2020 OT Individual Time: 0802-0859 OT Individual Time Calculation (min): 57 min   Session Note:  Pt worked on transfer from supine to sit EOB with independence to start session.  He then completed sit to stand with min guard assist and ambulation to the bathroom with use of the RW to gather towels for washing up at the sink.  He was able to sit in the wheelchair and complete washing of his UB and face as well as completing oral hygiene with supervision.  He then donned his shoes and tied them with supervision as well.  Finished session with pt taking used towels and washcloth over to the laundry bag on the door at min guard assist with the RW.  He transferred back to the bed to complete session with call button and phone in reach and safety belt in place.   Patient has met 8 of 10 long term goals due to improved activity tolerance, improved balance, ability to compensate for deficits, functional use of  RIGHT upper and RIGHT lower extremity and improved coordination.  Patient to discharge at  [facility] Assist level.  Patient's care partner did not attend family education sessions schedule but pt reports they will be able to assist him as needed.   Reasons goals not met: Pt requires min guard assist for sit to stand as well as min assist for dynamic stranding balance.   Recommendation:  Patient will benefit from ongoing skilled OT services in home health setting to continue to advance functional skills in the area of BADL, iADL and Reduce care partner burden.  Feel pt will continue to benefit from [facility] to further increase independence with selfcare tasks to a modified independent level as well as increasing bilateral hand strength and RUE coordination.  Recommend 24 hour supervision initially for safety.    Equipment: tub bench, 3:1  Reasons for discharge:  treatment goals met and discharge from hospital  Patient/family agrees with progress made and goals achieved: Yes  OT Discharge Precautions/Restrictions  Precautions Precautions: Fall Precaution Comments: Translator helpful but pt speaks pretty good English Restrictions Weight Bearing Restrictions: No   Pain Pain Assessment Pain Scale: 0-10 Pain Score: 0-No pain Pain Type: Neuropathic pain Pain Location: Foot Pain Orientation: Right;Left Pain Descriptors / Indicators: Discomfort Pain Onset: With Activity Pain Intervention(s): Repositioned;Emotional support Multiple Pain Sites: Yes 2nd Pain Site Wong-Baker Pain Rating: Hurts little more Pain Type: Neuropathic pain Pain Location: Hand Pain Orientation: Left;Right Pain Descriptors / Indicators: Tingling Pain Frequency: Constant Pain Onset: On-going Pain Intervention(s): Emotional support ADL ADL Eating: Independent Where Assessed-Eating: Wheelchair Grooming: Independent Where Assessed-Grooming: Wheelchair Upper Body Bathing: Supervision/safety Where Assessed-Upper Body Bathing: Shower,Chair Lower Body Bathing: Minimal assistance Where Assessed-Lower Body Bathing: Chair,Shower Upper Body Dressing: Independent Where Assessed-Upper Body Dressing: Wheelchair Lower Body Dressing: Minimal assistance Where Assessed-Lower Body Dressing: Wheelchair Toileting: Minimal assistance Where Assessed-Toileting: Bedside Commode Toilet Transfer: Therapist, music Method: Counselling psychologist: Radiographer, therapeutic: Minimal assistance Clinical cytogeneticist Method: Optometrist: Facilities manager: Curator Method: Heritage manager: Radio broadcast assistant ADL Comments: patient refused lower body dressing, required max A to donn clean hospital gown, dependent to wash feet and put on clean slipper socks,  refused to get OOB this session Vision Baseline Vision/History: No visual deficits Patient Visual Report: No change from baseline Vision  Assessment?: No apparent visual deficits Perception  Perception: Within Functional Limits Praxis Praxis: Intact Cognition Overall Cognitive Status: Within Functional Limits for tasks assessed Arousal/Alertness: Awake/alert Orientation Level: Oriented X4 Attention: Focused;Sustained Focused Attention: Appears intact Sustained Attention: Appears intact Memory: Appears intact Awareness: Appears intact Problem Solving: Appears intact Safety/Judgment: Appears intact Sensation Sensation Light Touch: Impaired Detail Peripheral sensation comments: N/T in distal 4 extremities Light Touch Impaired Details: Impaired RUE;Impaired LUE (tingling in hands but able to detect light touch) Hot/Cold: Appears Intact Proprioception: Impaired by gross assessment Proprioception Impaired Details: Impaired RLE;Impaired LLE Stereognosis: Appears Intact Additional Comments: Bilateral sensation in arms WFLS, he does report tingling in his hands, but can detect light touch Coordination Gross Motor Movements are Fluid and Coordinated: No Fine Motor Movements are Fluid and Coordinated: No Coordination and Movement Description: Pt still with decreased FM coordination bilaterally as well as decreased hand strength.  Dysmetria noted with finger to nose test in the right hand. Heel Shin Test: WFL B LE Motor  Motor Motor: Ataxia Motor - Discharge Observations: Weakness in bilateral hands and LEs still present but improved since eval. Mobility  Bed Mobility Bed Mobility: Rolling Right;Rolling Left;Supine to Sit;Sit to Supine Rolling Right: Independent Rolling Left: Independent Supine to Sit: Independent Sit to Supine: Independent Transfers Sit to Stand: Contact Guard/Touching assist Stand to Sit: Contact Guard/Touching assist  Trunk/Postural Assessment  Cervical  Assessment Cervical Assessment: Within Functional Limits Thoracic Assessment Thoracic Assessment: Exceptions to [facility] (increased kyphosis) Lumbar Assessment Lumbar Assessment: Exceptions to [facility] (posterior pelvic tilt) Postural Control Postural Control: Deficits on evaluation Righting Reactions: delayed and inadequate Postural Limitations: limited ability to reach outside BOS while standing  Balance Balance Balance Assessed: Yes Static Sitting Balance Static Sitting - Balance Support: Feet supported Static Sitting - Level of Assistance: 7: Independent Dynamic Sitting Balance Dynamic Sitting - Balance Support: During functional activity Dynamic Sitting - Level of Assistance: 7: Independent Static Standing Balance Static Standing - Balance Support: Bilateral upper extremity supported;During functional activity Static Standing - Level of Assistance: 5: Stand by assistance Dynamic Standing Balance Dynamic Standing - Balance Support: During functional activity Dynamic Standing - Level of Assistance: 4: Min assist Dynamic Standing - Balance Activities: Reaching across midline;Reaching for objects Extremity/Trunk Assessment RUE Assessment RUE Assessment: Exceptions to [facility] Passive Range of Motion (PROM) Comments: WFLS for all joints Active Range of Motion (AROM) Comments: WFLS for all joints General Strength Comments: 4/5 in shoulder flexion as well as elbow flexion/extension. Only 3+/5 grip strength grossly.  Can oppose thumb to all digits with decreased ability to adduct fingers. LUE Assessment LUE Assessment: Exceptions to [facility] Passive Range of Motion (PROM) Comments: WFLS Active Range of Motion (AROM) Comments: WFLS General Strength Comments: Shoulder flexion as well as elbow flexion/extension 4+/5, grip 3+/5 but slightly stronger than right hand.  Decreased ability to oppose thumb to the 5th digit, but can adduct and abduct digits.   Leocadio Heal OTR/L 09/17/2020, 12:08 PM

## 2020-09-18 LAB — GLUCOSE, CAPILLARY
Glucose-Capillary: 121 mg/dL — ABNORMAL HIGH (ref 70–99)
Glucose-Capillary: 147 mg/dL — ABNORMAL HIGH (ref 70–99)

## 2020-09-18 MED ORDER — HYDROCERIN EX CREA: 1.0000 "application " | TOPICAL_CREAM | Freq: Two times a day (BID) | CUTANEOUS | 0 refills | Status: AC

## 2020-09-18 MED ORDER — ACETAMINOPHEN 325 MG PO TABS: 325.0000 mg | ORAL_TABLET | ORAL | Status: DC | PRN

## 2020-09-18 MED ORDER — GABAPENTIN 400 MG PO CAPS: 400.0000 mg | ORAL_CAPSULE | Freq: Three times a day (TID) | ORAL | 0 refills | Status: DC

## 2020-09-18 MED ORDER — ADULT MULTIVITAMIN W/MINERALS CH: 1.0000 | ORAL_TABLET | Freq: Every day | ORAL | Status: AC

## 2020-09-18 MED ORDER — PANTOPRAZOLE SODIUM 40 MG PO TBEC: 40.0000 mg | DELAYED_RELEASE_TABLET | Freq: Every day | ORAL | 0 refills | Status: DC

## 2020-09-18 MED ORDER — IBUPROFEN 600 MG PO TABS: 600.0000 mg | ORAL_TABLET | ORAL | 0 refills | Status: DC | PRN

## 2020-09-18 NOTE — Progress Notes (Signed)
Patient ID: Jacob Cameron, male   DOB: [birth year], 65 y.o.   MRN: [id number]   SW made attempt to call patient son, no answer-left VM.  [place], [place] [phone]

## 2020-09-18 NOTE — Progress Notes (Addendum)
Patient ID: Jacob Cameron, male   DOB: October 18, 1955, 65 y.o.   MRN: [id number]   SW followed up on patient DME.   [employer] reports patient DME was delivered to the home on 09/17/2020. Family reports only patient wheelchair was delivered on 3/10. Following up on other items.   **Other items set to be delivered to patient home today.  [place], [place] [phone]

## 2020-09-18 NOTE — Discharge Summary (Signed)
Physician Discharge Summary  Patient ID: Jacob Cameron MRN: [id number] DOB/AGE: 01/13/1956 65 y.o.  Admit date: 09/01/2020 Discharge date: 09/18/2020  Discharge Diagnoses:  Principal Problem:   Axonal polyneuropathy Active Problems:   Neuropathic pain   Acute blood loss anemia   Diabetes mellitus, new onset (HCC)   Vitamin B 12 deficiency   Discharged Condition: stable   Significant Diagnostic Studies: No results found.  Labs:  Basic Metabolic Panel: BMP Latest Ref Rng & Units 09/14/2020 09/07/2020 09/02/2020  Glucose 70 - 99 mg/dL 599(J) 570(V) 779(T)  BUN 8 - 23 mg/dL 8 11 90(Z)  Creatinine 0.61 - 1.24 mg/dL 0.09 2.33 0.07  Sodium 135 - 145 mmol/L 136 136 135  Potassium 3.5 - 5.1 mmol/L 3.8 3.5 4.1  Chloride 98 - 111 mmol/L 101 100 103  CO2 22 - 32 mmol/L 25 25 22   Calcium 8.9 - 10.3 mg/dL 9.1 ) 6.2(U)    CBC: CBC Latest Ref Rng & Units 09/14/2020 09/07/2020 09/02/2020  WBC 4.0 - 10.5 K/uL 7.2 9.1 15.1(H)  Hemoglobin 13.0 - 17.0 g/dL 11.0(L) 11.4(L) 10.8(L)  Hematocrit 39.0 - 52.0 % 33.2(L) 35.4(L) 34.2(L)  Platelets 150 - 400 K/uL 266 287 363    CBG: Recent Labs  Lab 09/17/20 1153 09/17/20 1657 09/17/20 2055 09/18/20 0533 09/18/20 1152  GLUCAP 113* 98 158* 121* 147*    Brief HPI:   Jacob Cameron is a 65 y.o. male in relatively good health was admitted on 08/12/2020 with bilateral lower extremity and right upper extremity paresthesias and BLE weakness.  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  He was also found to have low vitamin B12 level and was started on supplementation.  He continued to have issues with pain and progressive symptoms therefore was treated with plasmapheresis x5 doses in addition to IV steroids due to suspicion of GBS variant.  He was found to have new onset diabetes with hemoglobin A1c 7.0  and elevated blood sugars were treated with sliding scale insulin.  He was showing decrease in symptoms with improvement in activity tolerance and mobility however continued to be limited by weakness with ataxic gait and balance deficits.  CIR was recommended to functional decline.   Hospital Course: Mickell Birdwell was admitted to rehab 09/01/2020 for inpatient therapies to consist of PT and OT at least three hours five days a week. Past admission physiatrist, therapy team and rehab RN have worked together to provide customized collaborative inpatient rehab.  He developed diarrhea with addition of Augmentin therefore this was discontinued.  He was educated on oral care and Peridex twice daily was ordered to help with dental pain.  Follow-up CBC showed leukocytosis has resolved without fevers or other signs of infection.  His blood pressures were monitored on TID basis and and have been reasonably controlled.  Blood sugars were monitored on a CHS basis and controlled on diet alone.  He completed 4 courses of weekly vitamin B12 injection on 03/07 and will need to transition to monthly supplementation after discharge.  He continued to report dysesthesias limiting mobility and gabapentin was titrated up to 400 mg 4 times daily with improvement in symptomatology--was reporting paresthesias by discharge.  Unasyn added to help with dry skin bilateral feet.  His p.o. intake has been good and is continent of bowel and bladder.  He has made steady progress during his stay and is currently  requires supervision at wheelchair level.  He will continue to receive further follow-up home health PT and OT by [facility] after discharge.   Rehab course: During patient's stay in rehab weekly team conferences were held to monitor patient's progress, set goals and discuss barriers to discharge. At admission, patient required max assist with basic self-care tasks and mod assist with mobility. He  has had improvement in  activity tolerance, balance, postural control as well as ability to compensate for deficits. He has had improvement in functional use RLE and LLE as well as improvement in awareness.  He requiressupervision with upper body care and min guard assist with LB care. He requires contact-guard assist supervision for transfers and to ambulate 90 feet with a rolling walker.  Family education was completed with son.     Disposition: Home  Diet: Regular--limit carbs.  Special Instructions: 1. No driving or strenuous activity till cleared by MD. 2. Will need monthly Vitamin B12 injections starting 10/15/20 for supplementation.    Discharge Instructions    Ambulatory referral to Neurology   Complete by: As directed    An appointment is requested in approximately: 2-3 weeks follow up GBS Please contact son--Jacob Cameron with appt details--[phone]   Ambulatory referral to Physical Medicine Rehab   Complete by: As directed    3-4 week follow up appt--Patient has given permission to contact son Jacob Cameron) with details/information. [phone]     Allergies as of 09/18/2020      Reactions   Duloxetine Nausea And Vomiting   N/V immediately within 24 hours of taking medicine      Medication List    STOP taking these medications   amoxicillin-clavulanate 875-125 MG tablet Commonly known as: Augmentin   HYDROcodone-acetaminophen 5-325 MG tablet Commonly known as: Norco   metFORMIN 500 MG tablet Commonly known as: Glucophage   naproxen 375 MG tablet Commonly known as: NAPROSYN   senna-docusate 8.6-50 MG tablet Commonly known as: Senokot-S     TAKE these medications   acetaminophen 325 MG tablet Commonly known as: TYLENOL Take 1-2 tablets (325-650 mg total) by mouth every 4 (four) hours as needed for mild pain.   gabapentin 400 MG capsule Commonly known as: NEURONTIN Take 1 capsule (400 mg total) by mouth 4 (four) times daily -  with meals and at bedtime. What changed:   medication  strength  how much to take  when to take this   hydrocerin Crea Apply 1 application topically 2 (two) times daily.   multivitamin with minerals Tabs tablet Take 1 tablet by mouth daily.   pantoprazole 40 MG tablet Commonly known as: PROTONIX Take 1 tablet (40 mg total) by mouth daily.   polyethylene glycol 17 g packet Commonly known as: MIRALAX / GLYCOLAX Take 17 g by mouth daily.   rosuvastatin 5 MG tablet Commonly known as: CRESTOR Take 1 tablet (5 mg total) by mouth daily.       Follow-up Information    Kirsteins, Victorino Sparrow, MD Follow up.   Specialty: Physical Medicine and Rehabilitation Why: Office will call you with follow up appointment Contact information: [address] [place] [address] [phone]        Esperanza Richters, PA-C. Call.   Specialties: Internal Medicine, Family Medicine Why: For post hospital follow up Contact information: [address] [place] [address] [phone]        [facility] Follow up.   Why: Office will call you with appointment  next week. Call if you have not heard from the by the end of the week Contact information: [address]     [address] [place] [address] [phone]              Signed: Jacquelynn Cree 09/21/2020, 5:07 PM

## 2020-09-18 NOTE — Discharge Instructions (Signed)
Inpatient Rehab Discharge Instructions  Jacob Cameron Discharge date and time: 09/18/20   Activities/Precautions/ Functional Status: Activity: no lifting, driving, or strenuous exercise till cleared by MD Diet: regular diet Wound Care: none needed    Functional status:  ___ No restrictions     ___ Walk up steps independently _X__ 24/7 supervision/assistance   ___ Walk up steps with assistance ___ Intermittent supervision/assistance  ___ Bathe/dress independently ___ Walk with walker     ___ Bathe/dress with assistance ___ Walk Independently    ___ Shower independently ___ Walk with assistance    ___ Shower with assistance _X__ No alcohol     ___ Return to work/school ________   Special Instructions:  COMMUNITY REFERRALS UPON DISCHARGE:    Home Health:   PT     OT                    Agency: [facility]  Phone: [phone]    Medical Equipment/Items Ordered:                                                 Agency/Supplier:     My questions have been answered and I understand these instructions. I will adhere to these goals and the provided educational materials after my discharge from the hospital.  Patient/Caregiver Signature _______________________________ Date __________  Clinician Signature _______________________________________ Date __________  Please bring this form and your medication list with you to all your follow-up doctor's appointments.

## 2020-09-18 NOTE — Progress Notes (Signed)
PROGRESS NOTE   Subjective/Complaints: Patient is concerned about equipment arriving at his home today.  He received his wheelchair but not his walker or bedside commode.  He feels like he needs these before he can safely navigate his home environment.  ROS: Patient denies CP, shortness of breath, nausea, vomiting, diarrhea.  Objective:   No results found. No results for input(s): WBC, HGB, HCT, PLT in the last 72 hours. No results for input(s): NA, K, CL, CO2, GLUCOSE, BUN, CREATININE, CALCIUM in the last 72 hours.  Intake/Output Summary (Last 24 hours) at 09/18/2020 0846 Last data filed at 09/18/2020 0550 Gross per 24 hour  Intake 360 ml  Output 400 ml  Net -40 ml        Physical Exam: Vital Signs Blood pressure 92/72, pulse 94, temperature 98.5 F (36.9 C), temperature source Oral, resp. rate 18, height 5\' 8"  (1.727 m), weight 71.1 kg, SpO2 96 %.  General: No acute distress Mood and affect are appropriate Heart: Regular rate and rhythm no rubs murmurs or extra sounds Lungs: Clear to auscultation, breathing unlabored, no rales or wheezes Abdomen: Positive bowel sounds, soft nontender to palpation, nondistended Extremities: No clubbing, cyanosis, or edema Skin: No evidence of breakdown, no evidence of rash sensaory - able to distinguish lt touch, + parasthesia   Neurologic:  Alert Motor: Grossly 4/5 throughout   Assessment/Plan: 1. 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 Stable for D/C today F/u PCP in 3-4 weeks F/u PM&R 2 weeks See D/C summary See D/C instructions Care Tool:  Bathing    Body parts bathed by patient: Right arm,Left arm,Chest,Abdomen,Front perineal area,Buttocks,Right upper leg,Left upper leg,Face,Right lower leg,Left lower leg   Body parts bathed by helper: Right lower leg,Left lower leg Body parts n/a: Front perineal area,Buttocks,Right upper leg,Left upper leg (did not attempt)   Bathing  assist Assist Level: Minimal Assistance - Patient > 75%     Upper Body Dressing/Undressing Upper body dressing   What is the patient wearing?: Pull over shirt    Upper body assist Assist Level: Supervision/Verbal cueing    Lower Body Dressing/Undressing Lower body dressing    Lower body dressing activity did not occur: Refused What is the patient wearing?: Pants     Lower body assist Assist for lower body dressing: Minimal Assistance - Patient > 75%     Toileting Toileting Toileting Activity did not occur and hygiene only): Refused  Toileting assist Assist for toileting: Minimal Assistance - Patient > 75%     Transfers Chair/bed transfer  Transfers assist  Chair/bed transfer activity did not occur: Refused  Chair/bed transfer assist level: Supervision/Verbal cueing Chair/bed transfer assistive device: Walker,Armrests   Locomotion Ambulation   Ambulation assist   Ambulation activity did not occur: Refused  Assist level: Contact Guard/Touching assist Assistive device: Walker-rolling Max distance: 60   Walk 10 feet activity   Assist  Walk 10 feet activity did not occur: Refused  Assist level: Contact Guard/Touching assist Assistive device: Walker-rolling   Walk 50 feet activity   Assist Walk 50 feet with 2 turns activity did not occur: Safety/medical concerns  Assist level: Contact Guard/Touching assist Assistive device: Walker-rolling  Walk 150 feet activity   Assist Walk 150 feet activity did not occur: Safety/medical concerns         Walk 10 feet on uneven surface  activity   Assist Walk 10 feet on uneven surfaces activity did not occur: Safety/medical concerns         Wheelchair     Assist Will patient use wheelchair at discharge?: Yes Type of Wheelchair: Manual    Wheelchair assist level: Independent Max wheelchair distance: 150    Wheelchair 50 feet with 2 turns activity    Assist         Assist Level: Independent   Wheelchair 150 feet activity     Assist      Assist Level: Independent   Blood pressure 92/72, pulse 94, temperature 98.5 F (36.9 C), temperature source Oral, resp. rate 18, height 5\' 8"  (1.727 m), weight 71.1 kg, SpO2 96 %.    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 Antithrombotics: -DVT/anticoagulation:Pharmaceutical:Lovenox, d/c in am  -antiplatelet therapy: N/A 3. Pain Management:  Per Dr 5/11 2/27- tried adding Duloxetine and increasing Gabapentin to 600 mg BID yesterday- have to stop Duloxetine made ill- will add to allergy list.    Increased gabapentin to 400mg  TID - now mainly with parasthesia not dyesthesias  Controlled on 3/11 4. Mood:LCSW to follow for evaluation and support. -antipsychotic agents: N/A 5. Neuropsych: This patientiscapable of making decisions on hisown behalf. 6. Skin/Wound Care:routine pressure relief measures.c/o dry skin on feet order eucerin BID 7. Fluids/Electrolytes/Nutrition:Monitor I/Os  8. Dental pain:resolved --Educated on importance of oral care due to recessed gums. --Will order peridex bid 9. T2DM-new diagnosis: Hgb A1c-7.0--will consult dietician to educate on CM diet --Monitor BS ac/hs with SSI for elevated BS CBG (last 3)  Recent Labs    09/17/20 1657 09/17/20 2055 09/18/20 0533  GLUCAP 98 158* 121*   Controlled 3/11 10. Vitamin B 12 deficiency: Being supplemented 11. Hyponatremia: Resolved. 12. Leucocytosis: Resolved   Likely reactive due to fever  steroids. No clear indication for augmentin  --Monitor for fevers and other signs of infection. 13.  Diarrhea , likely abx associated with d/c and monitor   Patient improving 14.  Acute blood loss anemia  Hemoglobin  11.2 on 3/7  Continue to monitor  LOS: 17 days A FACE TO FACE EVALUATION WAS PERFORMED  5/11 09/18/2020, 8:46 AM

## 2020-09-21 ENCOUNTER — Telehealth: Payer: Self-pay

## 2020-09-21 DIAGNOSIS — E538 Deficiency of other specified B group vitamins: Secondary | ICD-10-CM

## 2020-09-21 NOTE — Telephone Encounter (Signed)
Transition Care Management Unsuccessful Follow-up Telephone Call  Date of discharge and from where:  09/18/2020-[place]  Attempts:  1st Attempt  Reason for unsuccessful TCM follow-up call:  No answer/busy

## 2020-09-22 ENCOUNTER — Telehealth: Payer: Self-pay

## 2020-09-22 ENCOUNTER — Telehealth: Payer: Self-pay | Admitting: Medical

## 2020-09-22 NOTE — Telephone Encounter (Signed)
Pt never seen by me. MD on discharged put me on as his pcp.   Can call and schedule to get established. I took myself off as his pcp until gets established.

## 2020-09-22 NOTE — Telephone Encounter (Signed)
Betsy, PT from [facility] called requesting start of care 09/22/20. Unable to reach patient or any family members. I advised Betsy to call back if can't reach patient to start care.

## 2020-09-22 NOTE — Telephone Encounter (Signed)
Transition Care Management Unsuccessful Follow-up Telephone Call  Date of discharge and from where:  09/18/2020-[facility]  Attempts:  3rd Attempt  Reason for unsuccessful TCM follow-up call:  Unable to reach patient

## 2020-09-23 ENCOUNTER — Telehealth: Payer: Self-pay

## 2020-09-23 NOTE — Telephone Encounter (Signed)
Betsy, PT from [facility] called back stating she has been unable to reach patient to start care. I called and left message on both sons VM.

## 2020-09-24 NOTE — Telephone Encounter (Signed)
Pt has an appt scheduled with you on 10/22/20.

## 2020-09-30 ENCOUNTER — Telehealth: Payer: Self-pay | Admitting: *Deleted

## 2020-09-30 NOTE — Telephone Encounter (Signed)
[place] PT called for POC to begin this week 2wk3,1wk2.  She reports that the patient did have a fall yesterday (her knees gave out) but denied injury.  Approval given for POC.

## 2020-10-09 DIAGNOSIS — G610 Guillain-Barre syndrome: Secondary | ICD-10-CM

## 2020-10-09 DIAGNOSIS — E119 Type 2 diabetes mellitus without complications: Secondary | ICD-10-CM | POA: Diagnosis not present

## 2020-10-09 DIAGNOSIS — M6281 Muscle weakness (generalized): Secondary | ICD-10-CM

## 2020-10-09 DIAGNOSIS — G6289 Other specified polyneuropathies: Secondary | ICD-10-CM | POA: Diagnosis not present

## 2020-10-14 ENCOUNTER — Telehealth: Payer: Self-pay

## 2020-10-14 NOTE — Telephone Encounter (Signed)
Hospital notes reviewed per protocol: Verbal okay given to Will RN ([facility]), for occupational therapy twice a week for 2 weeks, then once a week for one week.  Call back phone number [phone].

## 2020-10-15 ENCOUNTER — Encounter: Payer: Medicare Other | Attending: Physical Medicine & Rehabilitation | Admitting: Physical Medicine & Rehabilitation

## 2020-10-15 ENCOUNTER — Other Ambulatory Visit: Payer: Self-pay | Admitting: Physical Medicine and Rehabilitation

## 2020-10-22 ENCOUNTER — Ambulatory Visit: Payer: Medicare Other | Admitting: Medical

## 2020-10-23 ENCOUNTER — Telehealth: Payer: Self-pay | Admitting: Physical Medicine and Rehabilitation

## 2020-10-23 NOTE — Telephone Encounter (Signed)
Patient's son called for refills on medications as he does not have follow up with PCP till next week. Advised him that one month supple of Gabapentin, Crestor and Protonix will be called in to [facility] on [address] and that all other refills need to come from PCP.

## 2020-10-26 ENCOUNTER — Telehealth: Payer: Self-pay

## 2020-10-26 NOTE — Telephone Encounter (Signed)
[place] with [facility] called for verbal approval of in-home physical therapy. To be scheduled for twice a week for 4 weeks.   Patient did not show for his Feb 2022 follow up appointment. Do you want to grant approval for visits?  Call back phone [phone] [facility]).

## 2020-10-27 NOTE — Telephone Encounter (Signed)
Dr. Wynn Banker reply given to [facility].

## 2020-10-28 ENCOUNTER — Telehealth: Payer: Self-pay | Admitting: *Deleted

## 2020-10-28 NOTE — Telephone Encounter (Signed)
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  Please advise.

## 2020-10-30 NOTE — Telephone Encounter (Addendum)
:  Kirsteins, Victorino Sparrow, MD  Doreene Eland, RN  "We had similar issues while patient was in the hospital. Would encourage to have patient take gabapentin 1 to 2 hours prior to therapy appointment as well as Tylenol 1 hour prior to therapy appointment. If patient needs a refill have him call our office."   Notified OT Harvie Heck.

## 2020-11-04 ENCOUNTER — Ambulatory Visit: Payer: Medicare Other | Admitting: Medical

## 2020-11-04 NOTE — Telephone Encounter (Addendum)
Lauren OT from [facility] called to get clarification on orders given to Harvie Heck OTA for Mr Boswell to take gabapentin 1-2 hours prior to Therapy and a tylenol with it.  She is asking if this is an extra dose of gabapentin or one of the 4xday doses, and how much tylenol is Dr Wynn Banker saying to take?  Please advise.

## 2020-11-05 NOTE — Telephone Encounter (Addendum)
Per Dr Bryson Dames: Not an extra dose of gaba, may take 500- 650mg  of tylenol. I have notified Lauren @[facility].

## 2020-11-10 ENCOUNTER — Ambulatory Visit: Payer: Medicare Other | Admitting: Medical

## 2020-11-11 ENCOUNTER — Telehealth: Payer: Self-pay | Admitting: Medical

## 2020-11-11 NOTE — Telephone Encounter (Signed)
On review of patient's chart I can't find any office visits with me.  I have never seen patient/do not have established relationship. I cannot be signing forms orders etc.  Looks like he was supposed to establish care at the end of April.  If he establishes care in person then going forward I can sign appropriate forms etc.

## 2020-11-11 NOTE — Telephone Encounter (Signed)
[name] from [facility] called wants to know if Jacob Cameron will sign on Home Health Care order so she can continue PT and OT after this week there will be no visit  Please Advice

## 2020-11-13 ENCOUNTER — Other Ambulatory Visit: Payer: Self-pay

## 2020-11-13 ENCOUNTER — Ambulatory Visit (HOSPITAL_BASED_OUTPATIENT_CLINIC_OR_DEPARTMENT_OTHER)
Admission: RE | Admit: 2020-11-13 | Discharge: 2020-11-13 | Disposition: A | Discharge status: Home / Self Care | Payer: Medicare Other | Source: Ambulatory Visit | Attending: Medical | Admitting: Medical

## 2020-11-13 ENCOUNTER — Telehealth: Payer: Self-pay | Admitting: Medical

## 2020-11-13 ENCOUNTER — Ambulatory Visit (INDEPENDENT_AMBULATORY_CARE_PROVIDER_SITE_OTHER): Payer: Medicare Other | Admitting: Medical

## 2020-11-13 VITALS — BP 120/88 | HR 109 | Resp 18 | Ht 68.0 in | Wt 170.0 lb

## 2020-11-13 DIAGNOSIS — M79671 Pain in right foot: Secondary | ICD-10-CM | POA: Insufficient documentation

## 2020-11-13 DIAGNOSIS — G603 Idiopathic progressive neuropathy: Secondary | ICD-10-CM

## 2020-11-13 DIAGNOSIS — E1169 Type 2 diabetes mellitus with other specified complication: Secondary | ICD-10-CM

## 2020-11-13 DIAGNOSIS — E119 Type 2 diabetes mellitus without complications: Secondary | ICD-10-CM

## 2020-11-13 DIAGNOSIS — M79672 Pain in left foot: Secondary | ICD-10-CM | POA: Diagnosis present

## 2020-11-13 DIAGNOSIS — M792 Neuralgia and neuritis, unspecified: Secondary | ICD-10-CM | POA: Diagnosis not present

## 2020-11-13 DIAGNOSIS — E785 Hyperlipidemia, unspecified: Secondary | ICD-10-CM

## 2020-11-13 DIAGNOSIS — D62 Acute posthemorrhagic anemia: Secondary | ICD-10-CM | POA: Diagnosis not present

## 2020-11-13 DIAGNOSIS — E538 Deficiency of other specified B group vitamins: Secondary | ICD-10-CM

## 2020-11-13 MED ORDER — ROSUVASTATIN CALCIUM 5 MG PO TABS: 5.0000 mg | ORAL_TABLET | Freq: Every day | ORAL | 3 refills | Status: DC

## 2020-11-13 MED ORDER — GABAPENTIN 400 MG PO CAPS: ORAL_CAPSULE | ORAL | 0 refills | Status: DC

## 2020-11-13 MED ORDER — ACETAMINOPHEN 325 MG PO TABS
325.0000 mg | ORAL_TABLET | ORAL | 1 refills | Status: AC | PRN
Start: 2020-11-13 — End: ?

## 2020-11-13 MED ORDER — PANTOPRAZOLE SODIUM 40 MG PO TBEC: 40.0000 mg | DELAYED_RELEASE_TABLET | Freq: Every day | ORAL | 3 refills | Status: DC

## 2020-11-13 NOTE — Addendum Note (Signed)
Addended by: Mervin Kung A on: 11/13/2020 03:17 PM   Modules accepted: Orders

## 2020-11-13 NOTE — Telephone Encounter (Signed)
[name] from [facility] called wants to know if Abuk Selleck will sign on Home Health Care order so she can continue PT and OT after this week there will be no visit   This is message I got sent to me last week. Now that I have seen pt can ok PT and OT.  But can you look for number of this person above. Or call [facility] and explain ok to resume PT and OT. If they fax over order will sign.

## 2020-11-13 NOTE — Patient Instructions (Addendum)
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  Patient still has some weakness of both upper and lower extremities.  Lower extremities worse than upper extremities.  The weakness is symmetric.   History of recent neuropathic pain since discharge.  Increasing gabapentin to 400 mg 1 tablet 4 times daily.  With last dose a day can take extra tablet.  He will see neurologist at the end of the month and discussed dosing and may be switched to Lyrica?  History of anemia.  We will check CBC posthospitalization and iron level.  Advised patient to continue Protonix.  New onset diabetes with A1c of 7.  Will review labs today and likely prescribe pathology milligrams of metformin to take daily.   Hyperlipidemia.  On Crestor since discharge from hospital.  Will get lipid panel today along with metabolic panel.  History of bilateral heel pain.  Placed order for bilateral feet x-rays.  Blood pressure was good when I checked 3 times.  Our machine gave initial high reading then very low reading?  Usually when PT OT checks his blood pressure is good or on the low side.  Continue to follow blood pressures.  Follow-up in 2 weeks or as needed.  Will approve additional needed therapy.   Guillain-Barr Syndrome Guillain-Barr syndrome (GBS) is a rare disorder in which the body's disease-fighting system (immune system) attacks the nerves. This causes numbness and tingling. It can eventually develop into a serious condition and, in some cases, cause paralysis. GBS does not spread from person to person (is not contagious). Most people with GBS recover within a few months. However, some people may have muscle weakness that lasts for a few years. What are the causes? The exact cause of GBS is not known. In most cases, GBS develops a few days or weeks after you have an infection, such as:  A stomach infection caused by Campylobacter bacteria. This type of infection usually causes  diarrhea.  An infection of the nose, throat, and upper airways (respiratory infection), such as a cold or the flu.  A viral infection. Less commonly, GBS may be triggered by:  Surgery.  A vaccine. What are the signs or symptoms? The most common first symptoms are tingling, numbness, and weakness in the lower legs. Other symptoms may develop over hours, days, or weeks. These may include:  Muscle weakness that spreads from the legs to the abdomen and then the arms.  Aching or burning pain of the arms and legs.  Inability to move the arms, legs, or both (paralysis).  Weakness of muscles in the face.  Trouble chewing or swallowing.  Slurred speech.  Difficulty breathing. How is this diagnosed? This condition may be diagnosed based on:  Your symptoms and medical history. Your health care provider will ask about any recent infections you have had.  A physical exam.  A test that measures how quickly your nerves carry signals to your muscles (nerve conduction velocity test).  Testing a sample of fluid that surrounds the spinal cord (lumbar puncture).   How is this treated? If your symptoms are mild, you may be given medicines to control your symptoms. If your condition becomes severe, you may need to be treated at a hospital. At the hospital, treatment may include:  Medicines. These help to improve numbness or tingling sensations in your arms and legs (paresthesias) caused by irritation of the nerves.  Plasma exchange (plasmapheresis). This is a procedure in which the immune system cells that are attacking your nerves  are removed from your blood.  Getting proteins (immunoglobulins or antibodies) that slow down the immune system's attack on your nerves. These may be given through an IV line inserted into one of your veins.  Oxygen and breathing assistance. Treatment may also include physical therapy to help strengthen your muscles. After being treated in the hospital, you may be  transferred to a rehabilitation center to get intensive physical therapy. Once your strength improves, you may continue to get physical therapy at home. Additional physical therapy may be needed for many months. Follow these instructions at home:  If physical therapy was prescribed, do exercises as told by your health care provider.  Make sure you have the support you need. Someone may need to help you bathe, dress, and prepare meals until you regain your strength.  Rest as needed. Return to your normal activities as told by your health care provider. Ask your health care provider what activities are safe for you.  Take over-the-counter and prescription medicines only as told by your health care provider.  Keep all follow-up visits as told by your health care provider. This is important.   Contact a health care provider if you:  Have new symptoms or your symptoms get worse.  Do not feel like you are getting better or regaining strength.  Do not feel safe or supported at home.  Are having trouble coping with your recovery. Get help right away if you:  Have trouble breathing or chest pain  Have trouble swallowing.  Choke after eating or drinking.  Develop a fever.  Cannot move.  Faint or feel dizzy.  Have pain, swelling, warmth, or redness in an arm or leg. Summary  Guillain-Barr syndrome (GBS) is a rare disorder in which the body's disease-fighting system (immune system) attacks the nerves.  GBS often requires treatment at a hospital. Treatment in the hospital may include medicines, plasmapheresis, getting proteins that slow down the immune system's attack on your nerves, or oxygen and breathing assistance.  Months of physical therapy may be needed until you regain your strength. This information is not intended to replace advice given to you by your health care provider. Make sure you discuss any questions you have with your health care provider. Document Revised:  04/07/2020 Document Reviewed: 04/07/2020 Elsevier Patient Education  2021 [employer].

## 2020-11-13 NOTE — Telephone Encounter (Signed)
Physical therapy is calling to see if Jacob Cameron will sign off on the OT &  PT. Orders, since he was seen today. Please call back to confirm

## 2020-11-13 NOTE — Progress Notes (Signed)
Subjective:    Patient ID: Jacob Cameron, male    DOB: January 04, 1956, 65 y.o.   MRN: [id number]  HPI  Pt in for first time.  Pt did not have former pcp recently. Pt lived in [place] since 1998.  Pt states before hospital admission he was very active.  Pt did not eat lunch today.   Recent hospital admission   Admit date: 09/01/2020 Discharge date: 09/18/2020  Discharge Diagnoses:  Principal Problem:   Axonal polyneuropathy Active Problems:   Neuropathic pain   Acute blood loss anemia   Diabetes mellitus, new onset (HCC)   Vitamin B 12 deficiency  Bp when I checked good. When ot/pt chedks controlled or low. Also high cholesterol on lab review.   Hospital Course: Garren Greenman was admitted to rehab 09/01/2020 for inpatient therapies to consist of PT and OT at least three hours five days a week. Past admission physiatrist, therapy team and rehab RN have worked together to provide customized collaborative inpatient rehab.  He developed diarrhea with addition of Augmentin therefore this was discontinued.  He was educated on oral care and Peridex twice daily was ordered to help with dental pain.  Follow-up CBC showed leukocytosis has resolved without fevers or other signs of infection.  His blood pressures were monitored on TID basis and and have been reasonably controlled.  Blood sugars were monitored on a CHS basis and controlled on diet alone.  He completed 4 courses of weekly vitamin B12 injection on 03/07 and will need to transition to monthly supplementation after discharge.  He continued to report dysesthesias limiting mobility and gabapentin was titrated up to 400 mg 4 times daily with improvement in symptomatology--was reporting paresthesias by discharge.  Unasyn added to help with dry skin bilateral feet.  His p.o. intake has been good and is continent of bowel and bladder.  He has made steady progress during his stay and is currently requires supervision at wheelchair level.   He will continue to receive further follow-up home health PT and OT by [facility] after discharge.   Rehab course: During patient's stay in rehab weekly team conferences were held to monitor patient's progress, set goals and discuss barriers to discharge. At admission, patient required max assist with basic self-care tasks and mod assist with mobility. He  has had improvement in activity tolerance, balance, postural control as well as ability to compensate for deficits. He has had improvement in functional use RLE and LLE as well as improvement in awareness.  He requiressupervision with upper body care and min guard assist with LB care. He requires contact-guard assist supervision for transfers and to ambulate 90    Disposition: Home  Diet: Regular--limit carbs.  Special Instructions: 1. No driving or strenuous activity till cleared by MD. 2. Will need monthly Vitamin B12 injections starting 10/15/20 for supplementation.        Discharge Instructions    Ambulatory referral to Neurology   Complete by: As directed    An appointment is requested in approximately: 2-3 weeks follow up GBS Please contact son--Farruk with appt details--[phone]   Ambulatory referral to Physical Medicine Rehab   Complete by: As directed    3-4 week follow up appt--Patient has given permission to contact son Dimitrius Steedman) with details/information. [phone]    Pt is going thru PT/Ot he needs that authorized.  He will follow up with neurologist on May 29,2022.   Review of Systems     Objective:   Physical  Exam  General Mental Status- Alert. General Appearance- Not in acute distress.   Skin General: Color- Normal Color. Moisture- Normal Moisture.  Neck Carotid Arteries- Normal color. Moisture- Normal Moisture. No carotid bruits. No JVD.  Chest and Lung Exam Auscultation: Breath Sounds:-Normal.  Cardiovascular Auscultation:Rythm- Regular. Murmurs & Other Heart  Sounds:Auscultation of the heart reveals- No Murmurs.  Abdomen Inspection:-Inspeection Normal. Palpation/Percussion:Note:No mass. Palpation and Percussion of the abdomen reveal- Non Tender, Non Distended + BS, no rebound or guarding.    Neurologic Cranial Nerve exam:- CN III-XII intact(No nystagmus), symmetric smile. Drift Test:- No drift. Strength:- 5/5 equal and symmetric strength both upper and lower extremities.      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  Patient still has some weakness of both upper and lower extremities.  Lower extremities worse than upper extremities.  The weakness is symmetric.   History of recent neuropathic pain since discharge.  Increasing gabapentin to 400 mg 1 tablet 4 times daily.  With last dose a day can take extra tablet.  He will see neurologist at the end of the month and discussed dosing and may be switched to Lyrica?  History of anemia.  We will check CBC posthospitalization and iron level.  Advised patient to continue Protonix.  New onset diabetes with A1c of 7.  Will review labs today and likely prescribe pathology milligrams of metformin to take daily.   Hyperlipidemia.  On Crestor since discharge from hospital.  Will get lipid panel today along with metabolic panel.  History of bilateral heel pain.  Placed order for bilateral feet x-rays.  Blood pressure was good when I checked 3 times.  Our machine gave initial high reading then very low reading?  Usually when PT OT checks his blood pressure is good or on the low side.  Continue to follow blood pressures.  Follow-up in 2 weeks or as needed.  Will approve additional needed therapy.  Time spent with patient today was  46  minutes which consisted of chart review, discussing diagnosis, work up treatment and documentation.

## 2020-11-14 LAB — CBC WITH DIFFERENTIAL/PLATELET
Absolute Monocytes: 885 cells/uL (ref 200–950)
Basophils Absolute: 71 cells/uL (ref 0–200)
Basophils Relative: 0.6 %
Eosinophils Absolute: 519 cells/uL — ABNORMAL HIGH (ref 15–500)
Eosinophils Relative: 4.4 %
HCT: 41.5 % (ref 38.5–50.0)
Hemoglobin: 13.8 g/dL (ref 13.2–17.1)
Lymphs Abs: 2419 cells/uL (ref 850–3900)
MCH: 27.7 pg (ref 27.0–33.0)
MCHC: 33.3 g/dL (ref 32.0–36.0)
MCV: 83.2 fL (ref 80.0–100.0)
MPV: 9.1 fL (ref 7.5–12.5)
Monocytes Relative: 7.5 %
Neutro Abs: 7906 cells/uL — ABNORMAL HIGH (ref 1500–7800)
Neutrophils Relative %: 67 %
Platelets: 460 10*3/uL — ABNORMAL HIGH (ref 140–400)
RBC: 4.99 10*6/uL (ref 4.20–5.80)
RDW: 13.2 % (ref 11.0–15.0)
Total Lymphocyte: 20.5 %
WBC: 11.8 10*3/uL — ABNORMAL HIGH (ref 3.8–10.8)

## 2020-11-14 LAB — COMPREHENSIVE METABOLIC PANEL
AG Ratio: 1.2 (calc) (ref 1.0–2.5)
ALT: 14 U/L (ref 9–46)
AST: 10 U/L (ref 10–35)
Albumin: 4 g/dL (ref 3.6–5.1)
Alkaline phosphatase (APISO): 59 U/L (ref 35–144)
BUN: 15 mg/dL (ref 7–25)
CO2: 24 mmol/L (ref 20–32)
Calcium: 9.9 mg/dL (ref 8.6–10.3)
Chloride: 101 mmol/L (ref 98–110)
Creat: 0.93 mg/dL (ref 0.70–1.25)
Globulin: 3.3 g/dL (calc) (ref 1.9–3.7)
Glucose, Bld: 101 mg/dL — ABNORMAL HIGH (ref 65–99)
Potassium: 4.3 mmol/L (ref 3.5–5.3)
Sodium: 137 mmol/L (ref 135–146)
Total Bilirubin: 0.5 mg/dL (ref 0.2–1.2)
Total Protein: 7.3 g/dL (ref 6.1–8.1)

## 2020-11-14 LAB — LIPID PANEL
Cholesterol: 179 mg/dL (ref <200)
HDL: 31 mg/dL — ABNORMAL LOW (ref >=40)
LDL Cholesterol (Calc): 110 mg/dL (calc) — ABNORMAL HIGH
Non-HDL Cholesterol (Calc): 148 mg/dL (calc) — ABNORMAL HIGH (ref <130)
Total CHOL/HDL Ratio: 5.8 (calc) — ABNORMAL HIGH (ref <5.0)
Triglycerides: 264 mg/dL — ABNORMAL HIGH (ref <150)

## 2020-11-14 LAB — IRON: Iron: 55 ug/dL (ref 50–180)

## 2020-11-14 LAB — VITAMIN B12: Vitamin B-12: 641 pg/mL (ref 200–1100)

## 2020-11-16 NOTE — Telephone Encounter (Signed)
Placed both OT and PT referral.

## 2020-11-16 NOTE — Addendum Note (Signed)
Addended by: Gwenevere Abbot on: 11/16/2020 02:01 PM   Modules accepted: Orders

## 2020-11-16 NOTE — Telephone Encounter (Signed)
Gave orders to Duwayne Heck D at [facility]   & she called back stating orders need to be faxed  for continuing Occupational therapy for Jacob Cameron      [facility] fax #[phone].

## 2020-11-16 NOTE — Telephone Encounter (Signed)
Spoke with Duwayne Heck D at office and gave orders

## 2020-11-17 NOTE — Telephone Encounter (Signed)
[name] called to check status of orders below. Verbal given.

## 2020-11-19 ENCOUNTER — Other Ambulatory Visit: Payer: Self-pay | Admitting: Physical Medicine and Rehabilitation

## 2020-11-25 ENCOUNTER — Telehealth: Payer: Self-pay | Admitting: Medical

## 2020-11-25 ENCOUNTER — Telehealth: Payer: Self-pay

## 2020-11-25 NOTE — Telephone Encounter (Signed)
Jacob Cameron from [facility] Caller # [phone]  Requesting OT 2 X a Week for 4 Weeks  Please advise

## 2020-11-25 NOTE — Telephone Encounter (Signed)
Verbal orders given to Nino Glow, PT w/ [facility].   Telephone; [phone].

## 2020-11-26 NOTE — Telephone Encounter (Signed)
Spoke w/ Will- verbal order given.

## 2020-12-08 ENCOUNTER — Ambulatory Visit (INDEPENDENT_AMBULATORY_CARE_PROVIDER_SITE_OTHER): Payer: Medicare Other | Admitting: Diagnostic Neuroimaging

## 2020-12-08 ENCOUNTER — Encounter: Payer: Self-pay | Admitting: Diagnostic Neuroimaging

## 2020-12-08 ENCOUNTER — Telehealth: Payer: Self-pay | Admitting: Diagnostic Neuroimaging

## 2020-12-08 VITALS — BP 104/74 | HR 92 | Ht 68.0 in

## 2020-12-08 DIAGNOSIS — G610 Guillain-Barre syndrome: Secondary | ICD-10-CM | POA: Diagnosis not present

## 2020-12-08 NOTE — Telephone Encounter (Signed)
Pt would like to call us back to schedule 6 month f/u.

## 2020-12-08 NOTE — Patient Instructions (Signed)
  GBS - continue PT exercises - continue gabapentin for nerve pain

## 2020-12-08 NOTE — Progress Notes (Signed)
[facility]  PATIENT: Jacob Cameron DOB: 04-01-1956  REFERRING CLINICIAN: Jacquelynn Cree, PA-C HISTORY FROM: patient  REASON FOR VISIT: new consult    HISTORICAL  CHIEF COMPLAINT:  Chief Complaint  Patient presents with  . 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  08/12/2020 patient woke up with sudden onset of numbness and tingling and pain in the feet with weakness.  Symptoms progressively worsened.  He went to the hospital for evaluation and had extensive testing.  MRI of the brain and spine were unremarkable.  Patient had areflexia and lower extremity weakness with ascending paralysis and numbness.  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  He was treated with IVIG without benefit.  Then he was given plasma exchange.  Patient was transition to rehab.  Since that time symptoms have only slightly improved.  He still has significant weakness in his legs.  He is able to stand up with assistance using a walker.  He has home PT 2 times per week.  Numbness and tingling continue in the bottom of feet but not that painful.  Uses gabapentin for relief of pain.  Also had patient was diagnosed with B12 deficiency and diabetes when he was admitted to the hospital.   REVIEW OF SYSTEMS: Full 14 system review of systems performed and negative with exception of: as per HPI.   ALLERGIES: Allergies  Allergen Reactions  . Duloxetine Nausea And Vomiting    N/V immediately within 24 hours of taking medicine    HOME MEDICATIONS: Outpatient Medications Prior to Visit  Medication Sig Dispense Refill  . acetaminophen (TYLENOL) 325 MG tablet Take 1-2 tablets (325-650 mg total) by mouth every 4 (four) hours as needed for mild pain. 30 tablet 1  . gabapentin (NEURONTIN)  400 MG capsule 1 tab po qid(with last dose of day take 1 extra tablet) 150 capsule 0  . hydrocerin (EUCERIN) CREA Apply 1 application topically 2 (two) times daily. 454 g 0  . Multiple Vitamin (MULTIVITAMIN WITH MINERALS) TABS tablet Take 1 tablet by mouth daily.    . pantoprazole (PROTONIX) 40 MG tablet Take 1 tablet (40 mg total) by mouth daily. 30 tablet 3  . polyethylene glycol (MIRALAX / GLYCOLAX) 17 g packet Take 17 g by mouth daily. 14 each 0  . rosuvastatin (CRESTOR) 5 MG tablet Take 1 tablet (5 mg total) by mouth daily. 30 tablet 3   No facility-administered medications prior to visit.    PAST MEDICAL HISTORY: Past Medical History:  Diagnosis Date  . Guillain Barr syndrome (HCC)   . Neuropathy     PAST SURGICAL HISTORY: Past Surgical History:  Procedure Laterality Date  . IR FLUORO GUIDE CV LINE RIGHT  08/19/2020  . IR US GUIDE VASC ACCESS RIGHT  08/19/2020    FAMILY HISTORY: No family history on file.  SOCIAL HISTORY: Social History   Socioeconomic History  . Marital status: Married    Spouse name: Not on file  . Number of children: Not on file  . Years of education: Not on file  . Highest education level: Not on file  Occupational History  . Occupation: retired  Tobacco Use  . Smoking status: Current Every Day Smoker    Packs/day: 0.25    Years: 8.00    Pack years:  2.00    Types: Cigarettes  . Smokeless tobacco: Never Used  Substance and Sexual Activity  . Alcohol use: Never  . Drug use: Never  . Sexual activity: Not on file  Other Topics Concern  . Not on file  Social History Narrative  . Not on file   Social Determinants of Health   Financial Resource Strain: Not on file  Food Insecurity: Not on file  Transportation Needs: Not on file  Physical Activity: Not on file  Stress: Not on file  Social Connections: Not on file  Intimate Partner Violence: Not on file     PHYSICAL EXAM  GENERAL EXAM/CONSTITUTIONAL: Vitals:  Vitals:   12/08/20  1238  BP: 104/74  Pulse: 92  Height: 5\' 8"  (1.727 m)     Body mass index is 25.85 kg/m. Wt Readings from Last 3 Encounters:  11/13/20 170 lb (77.1 kg)  09/18/20 156 lb 12 oz (71.1 kg)  09/01/20 163 lb 2.3 oz (74 kg)     Patient is in no distress; well developed, nourished and groomed; neck is supple  CARDIOVASCULAR:  Examination of carotid arteries is normal; no carotid bruits  Regular rate and rhythm, no murmurs  Examination of peripheral vascular system by observation and palpation is normal  EYES:  Ophthalmoscopic exam of optic discs and posterior segments is normal; no papilledema or hemorrhages  No exam data present  MUSCULOSKELETAL:  Gait, strength, tone, movements noted in Neurologic exam below  NEUROLOGIC: MENTAL STATUS:  No flowsheet data found.  awake, alert, oriented to person, place and time  recent and remote memory intact  normal attention and concentration  language fluent, comprehension intact, naming intact  fund of knowledge appropriate  CRANIAL NERVE:   2nd - no papilledema on fundoscopic exam  2nd, 3rd, 4th, 6th - pupils equal and reactive to light, visual fields full to confrontation, extraocular muscles intact, no nystagmus  5th - facial sensation symmetric  7th - facial strength symmetric  8th - hearing intact  9th - palate elevates symmetrically, uvula midline  11th - shoulder shrug symmetric  12th - tongue protrusion midline  MOTOR:   BUE 5 EXCEPT RIGHT DELTOID (4), RIGHT HAND FINGER ABDUCTION 4  BLE 4 EXCEPT RIGHT DF 2, LEFT DF 3   SENSORY:   normal and symmetric to light touch, pinprick, temperature, vibration; EXCEPT DECR IN FEET / ANKLES  COORDINATION:   finger-nose-finger, fine finger movements normal  REFLEXES:   deep tendon reflexes ABSENT  GAIT/STATION:   IN WHEEL CHAIR; NEEDS ASSISTANCE TO STAND     DIAGNOSTIC DATA (LABS, IMAGING, TESTING) - I reviewed patient records, labs, notes, testing  and imaging myself where available.  Lab Results  Component Value Date   WBC 11.8 (H) 11/13/2020   HGB 13.8 11/13/2020   HCT 41.5 11/13/2020   MCV 83.2 11/13/2020   PLT 460 (H) 11/13/2020      Component Value Date/Time   NA 137 11/13/2020 1516   K 4.3 11/13/2020 1516   CL 101 11/13/2020 1516   CO2 24 11/13/2020 1516   GLUCOSE 101 (H) 11/13/2020 1516   BUN 15 11/13/2020 1516   CREATININE 0.93 11/13/2020 1516   CALCIUM 9.9 11/13/2020 1516   PROT 7.3 11/13/2020 1516   ALBUMIN 3.7 09/02/2020 0624   AST 10 11/13/2020 1516   ALT 14 11/13/2020 1516   ALKPHOS 12 (L) 09/02/2020 0624   BILITOT 0.5 11/13/2020 1516   GFRNONAA >60 09/14/2020 0457   Lab Results  Component Value  Date   CHOL 179 11/13/2020   HDL 31 (L) 11/13/2020   LDLCALC 110 (H) 11/13/2020   TRIG 264 (H) 11/13/2020   CHOLHDL 5.8 (H) 11/13/2020   Lab Results  Component Value Date   HGBA1C 7.0 (H) 08/14/2020   Lab Results  Component Value Date   VITAMINB12 641 11/13/2020   Lab Results  Component Value Date   TSH 5.311 (H) 08/14/2020    08/13/20 MRI brain 1. No evidence of acute intracranial abnormality on this motion limited exam. No acute infarct. 2. Nonspecific paranasal sinus disease with air-fluid level in the left maxillary sinus. Correlate with signs/symptoms of sinusitis.   08/16/20 MRI spine 1. No acute injury of the cervical, thoracic, and lumbar spine. 2. Mild spondylosis of the cervical, thoracic and lumbar spine    ASSESSMENT AND PLAN  65 y.o. year old male here with:  Dx:  1. 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    Suanne Marker, MD  12/08/2020, 1:15 PM Certified in Neurology, Neurophysiology and Neuroimaging  [facility] [address], [address] [place], [place] [address] [phone]

## 2020-12-18 ENCOUNTER — Other Ambulatory Visit: Payer: Self-pay | Admitting: Medical

## 2020-12-18 ENCOUNTER — Telehealth: Payer: Self-pay | Admitting: Medical

## 2020-12-18 NOTE — Telephone Encounter (Signed)
Caller: Will ([facility] Call back # [phone]  Would like to extend OT  2 times a week for 2 weeks One time a week for 1 week  Ok to leave msg

## 2020-12-21 NOTE — Telephone Encounter (Signed)
VO given   And called pt and lvm to return call

## 2021-01-13 ENCOUNTER — Telehealth: Payer: Self-pay

## 2021-01-13 NOTE — Telephone Encounter (Signed)
[name], Occupational Therapist, with [facility] requesting verbal order for therapy. Requesting 2-week one, and 1-week one. The patient is Jacob Cameron, DOB, 01-08-1956. Call back number is [phone].

## 2021-01-13 NOTE — Telephone Encounter (Signed)
VO given to Will  [facility] called but lvm to return call to schedule an appointment

## 2021-01-15 NOTE — Telephone Encounter (Signed)
Calld both numbers in chart , lvm to return call

## 2021-01-18 NOTE — Telephone Encounter (Signed)
Called both numbers in chart , lvm to return call 

## 2021-01-20 ENCOUNTER — Other Ambulatory Visit: Payer: Self-pay | Admitting: Medical

## 2021-01-25 ENCOUNTER — Telehealth: Payer: Self-pay | Admitting: Medical

## 2021-01-25 NOTE — Telephone Encounter (Signed)
Caller: Will [facility]) Call back # [phone]  Verbal Order  OT Extend OT  2 times a week for 3 weeks  Ok to leave msg

## 2021-01-26 ENCOUNTER — Telehealth: Payer: Self-pay

## 2021-01-26 NOTE — Telephone Encounter (Signed)
VO given to Jacob Cameron , he states they always have a hard time getting in touch with him as well , he doesn't answer calls or texts so they just stop by the house to do PT but he Jacob Cameron tell [name] to call our office and schedule an appointment

## 2021-01-26 NOTE — Telephone Encounter (Signed)
[facility] called requesting VO for PT 2x3. VO given.

## 2021-01-27 ENCOUNTER — Telehealth: Payer: Self-pay

## 2021-01-27 NOTE — Telephone Encounter (Signed)
Pt called requesting a call back in required to his PT. He said he was told to call back about it. He was informed that ES is out of the office until Monday, and he said he will wait for his return call until he returns.

## 2021-01-27 NOTE — Telephone Encounter (Signed)
Called pt and lvm to return call.  

## 2021-01-28 NOTE — Telephone Encounter (Signed)
Called pt and lvm to return call.  

## 2021-01-29 NOTE — Telephone Encounter (Signed)
Called pt and lvm to return call.  

## 2021-02-19 ENCOUNTER — Telehealth: Payer: Self-pay | Admitting: Medical

## 2021-02-19 NOTE — Telephone Encounter (Signed)
Will, the occupational therapist from [facility], would like verbal orders to move the patient's discharge evaluation to next week. He can be reached to [phone]

## 2021-02-22 ENCOUNTER — Other Ambulatory Visit: Payer: Self-pay | Admitting: Medical

## 2021-02-22 NOTE — Telephone Encounter (Signed)
VO given to push discharge back and also asked william to tell avraham to call our office to make an appointment and he stated he will try and everytime he tells him he needs an appointment his reply is " ill tell my son " , will send letter in mail .

## 2021-03-05 ENCOUNTER — Other Ambulatory Visit: Payer: Self-pay

## 2021-03-05 ENCOUNTER — Ambulatory Visit (INDEPENDENT_AMBULATORY_CARE_PROVIDER_SITE_OTHER): Payer: Medicare Other | Admitting: Medical

## 2021-03-05 VITALS — BP 108/60 | HR 68 | Temp 97.6°F | Resp 18

## 2021-03-05 DIAGNOSIS — M792 Neuralgia and neuritis, unspecified: Secondary | ICD-10-CM | POA: Diagnosis not present

## 2021-03-05 DIAGNOSIS — G603 Idiopathic progressive neuropathy: Secondary | ICD-10-CM

## 2021-03-05 DIAGNOSIS — E1169 Type 2 diabetes mellitus with other specified complication: Secondary | ICD-10-CM | POA: Diagnosis not present

## 2021-03-05 DIAGNOSIS — E785 Hyperlipidemia, unspecified: Secondary | ICD-10-CM

## 2021-03-05 DIAGNOSIS — E538 Deficiency of other specified B group vitamins: Secondary | ICD-10-CM | POA: Diagnosis not present

## 2021-03-05 DIAGNOSIS — R739 Hyperglycemia, unspecified: Secondary | ICD-10-CM

## 2021-03-05 MED ORDER — GABAPENTIN 400 MG PO CAPS: ORAL_CAPSULE | ORAL | 3 refills | Status: DC

## 2021-03-05 NOTE — Patient Instructions (Addendum)
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  Had polyneuropathy neuropathic pain and extremity weakness.  Recommend continue to follow-up with neurologist as regularly scheduled.  Continue PT and OT.  Refilling gabapentin today.   History of low B12.  Continue over-the-counter vitamin supplement.  We will check B12 on upcoming future labs.  History of high cholesterol.  Not fasting today.  Placing future metabolic panel and lipid panel.  Please get scheduled to do those labs fasting next week.  Elevated sugar in the past.  Including 29-month sugar average test on future labs. recommend low sugar diet.  Have the home health service send over a request for further PT and OT and I will sign orders.  Follow-up in 21-month or sooner if needed.

## 2021-03-05 NOTE — Progress Notes (Signed)
Subjective:    Patient ID: Jacob Cameron, male    DOB: 1956/04/17, 65 y.o.   MRN: [id number]  HPI  Pt in for follow up.  I have seen pt one time.  Pt is seeing PT and OT.  PT is coming over only one time a week.  Pt wants to continue seeing OT. He has not seen OT in about a week or more. He state was seeing them in past twice a week.   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 He has seen neurologist since last visit with me.  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    Pt states needs refill of gabapentin. Pt states he is getting some improved strength slowly and getting moe coordinated.    Hx of low b12. Last b12 level was normal.  Pt has high cholesterol. Pt is on crestor.    Review of Systems  Constitutional:  Negative for chills, fatigue and fever.  HENT:  Negative for dental problem.   Respiratory:  Negative for cough, chest tightness and wheezing.   Cardiovascular:  Negative for chest pain and palpitations.  Gastrointestinal:  Negative for abdominal pain.  Musculoskeletal:  Negative for back pain and joint swelling.        Hpi.  Neurological:  Negative for dizziness, numbness and headaches.       See hpi.  Hematological:  Negative for adenopathy. Does not bruise/bleed easily.  Psychiatric/Behavioral:  Negative for behavioral problems and confusion.     Past Medical History:  Diagnosis Date   Guillain Barr syndrome (HCC)    Neuropathy      Social History   Socioeconomic History   Marital status: Married    Spouse name: Not on file   Number of children: Not on file   Years of education: Not on file   Highest education level: Not on file  Occupational History   Occupation:  retired  Tobacco Use   Smoking status: Every Day    Packs/day: 0.25    Years: 8.00    Pack years: 2.00    Types: Cigarettes   Smokeless tobacco: Never  Substance and Sexual Activity   Alcohol use: Never   Drug use: Never   Sexual activity: Not on file  Other Topics Concern   Not on file  Social History Narrative   Not on file   Social Determinants of Health   Financial Resource Strain: Not on file  Food Insecurity: Not on file  Transportation Needs: Not on file  Physical Activity: Not on file  Stress: Not on file  Social Connections: Not on file  Intimate Partner Violence: Not on file    Past Surgical History:  Procedure Laterality Date   IR FLUORO GUIDE CV LINE RIGHT  08/19/2020   IR US GUIDE VASC ACCESS RIGHT  08/19/2020    No family history on file.  Allergies  Allergen Reactions   Duloxetine Nausea And Vomiting    N/V immediately within 24 hours of taking medicine    Current Outpatient Medications on File Prior to Visit  Medication Sig Dispense Refill   acetaminophen (TYLENOL) 325 MG tablet Take 1-2 tablets (325-650 mg total) by mouth every  4 (four) hours as needed for mild pain. 30 tablet 1   gabapentin (NEURONTIN) 400 MG capsule TAKE ONE CAPSULE BY MOUTH FOUR TIMES DAILY, TAKE AN EXTRA CAPSULE ON LAST DOSE OF THE DAY 150 capsule 0   hydrocerin (EUCERIN) CREA Apply 1 application topically 2 (two) times daily. 454 g 0   Multiple Vitamin (MULTIVITAMIN WITH MINERALS) TABS tablet Take 1 tablet by mouth daily.     pantoprazole (PROTONIX) 40 MG tablet Take 1 tablet (40 mg total) by mouth daily. 30 tablet 3   polyethylene glycol (MIRALAX / GLYCOLAX) 17 g packet Take 17 g by mouth daily. 14 each 0   rosuvastatin (CRESTOR) 5 MG tablet Take 1 tablet (5 mg total) by mouth daily. 30 tablet 3   No current facility-administered medications on file prior to visit.    BP 108/60 (BP Location: Left Arm, Patient Position: Sitting, Cuff Size: Normal)   Pulse 68   Temp 97.6 F  (36.4 C) (Oral)   Resp 18   SpO2 96%       Objective:   Physical Exam  General Mental Status- Alert. General Appearance- Not in acute distress.    Skin General: Color- Normal Color. Moisture- Normal Moisture.   Neck Carotid Arteries- Normal color. Moisture- Normal Moisture. No carotid bruits. No JVD.   Chest and Lung Exam Auscultation: Breath Sounds:-Normal.   Cardiovascular Auscultation:Rythm- Regular. Murmurs & Other Heart Sounds:Auscultation of the heart reveals- No Murmurs.   Abdomen Inspection:-Inspeection Normal. Palpation/Percussion:Note:No mass. Palpation and Percussion of the abdomen reveal- Non Tender, Non Distended + BS, no rebound or guarding.      Neurologic Cranial Nerve exam:- CN III-XII intact(No nystagmus), symmetric smile. Drift Test:- No drift.  Upper and lower ext- 3/5 strength.     Assessment & Plan:   History of probable GBS.  Had polyneuropathy neuropathic pain and extremity weakness.  Recommend continue to follow-up with neurologist as regularly scheduled.  Continue PT and OT.  Refilling gabapentin today.   History of low B12.  Continue over-the-counter vitamin supplement.  We will check B12 on upcoming future labs.  History of high cholesterol.  Not fasting today.  Placing future metabolic panel and lipid panel.  Please get scheduled to do those labs fasting next week.  Elevated sugar in the past.  Including 20-month sugar average test on future labs. recommend low sugar diet.  Have the home health service send over a request for further PT and OT and I will sign orders.  Follow-up in 86-month or sooner if needed.   Esperanza Richters, PA-C

## 2021-03-23 ENCOUNTER — Telehealth: Payer: Self-pay | Admitting: Medical

## 2021-03-23 NOTE — Telephone Encounter (Signed)
Caller/Agency: [facility] Callback Number: [phone] Requesting OT/PT/Skilled Nursing/Social Work/Speech Therapy: PT Frequency: 1x a week for [facility]

## 2021-03-23 NOTE — Telephone Encounter (Signed)
Called number on telephone note , woman answered stating that it wasn't Jacob Cameron but it was Jacob Cameron , no VO given

## 2021-03-25 ENCOUNTER — Other Ambulatory Visit: Payer: Self-pay | Admitting: Medical

## 2021-03-25 NOTE — Telephone Encounter (Signed)
Called number again , no answer, didn't leave vm , will wait until faxed order comes over to confirm it is the correct agency

## 2021-04-13 ENCOUNTER — Telehealth: Payer: Self-pay | Admitting: Medical

## 2021-04-13 NOTE — Telephone Encounter (Signed)
Please advise and also patient has a visit on 05/05/21

## 2021-04-13 NOTE — Telephone Encounter (Signed)
Patient would like Jacob Cameron to advice him if he is able to travel to his home country [place] with his physical impediments. Please advise.

## 2021-04-15 NOTE — Telephone Encounter (Signed)
Called pt and lvm to return call.  

## 2021-04-20 ENCOUNTER — Telehealth: Payer: Self-pay | Admitting: Medical

## 2021-04-20 MED ORDER — GABAPENTIN 400 MG PO CAPS: ORAL_CAPSULE | ORAL | 3 refills | Status: DC

## 2021-04-20 MED ORDER — PANTOPRAZOLE SODIUM 40 MG PO TBEC: 40.0000 mg | DELAYED_RELEASE_TABLET | Freq: Every day | ORAL | 3 refills | Status: DC

## 2021-04-20 NOTE — Telephone Encounter (Signed)
Rx sent 

## 2021-04-20 NOTE — Telephone Encounter (Signed)
Medication:  pantoprazole (PROTONIX) 40 MG tablet  gabapentin (NEURONTIN) 400 MG capsule    Has the patient contacted their pharmacy? No. (If no, request that the patient contact the pharmacy for the refill.) (If yes, when and what did the pharmacy advise?)    Preferred Pharmacy (with phone number or street name):  [id number] - [place], [place] - [address] AT [facility] OF [address]  [address], [place] [address]  Phone:  [phone]  Fax:  [phone]   Patient stated he will need a refill for both medications for 2 months due to him traveling out of town.

## 2021-04-20 NOTE — Telephone Encounter (Signed)
Called pt and lvm to return call , plans can be discussed at appt on 10/26

## 2021-04-28 ENCOUNTER — Other Ambulatory Visit: Payer: Self-pay

## 2021-04-28 MED ORDER — PANTOPRAZOLE SODIUM 40 MG PO TBEC: 40.0000 mg | DELAYED_RELEASE_TABLET | Freq: Every day | ORAL | 3 refills | Status: AC

## 2021-04-28 MED ORDER — GABAPENTIN 400 MG PO CAPS: ORAL_CAPSULE | ORAL | 3 refills | Status: AC

## 2021-04-28 NOTE — Telephone Encounter (Signed)
Pt called back and spoke with front desk stated he leaves the 10/22

## 2021-05-05 ENCOUNTER — Ambulatory Visit: Payer: Medicare Other | Admitting: Medical

## 2022-09-07 ENCOUNTER — Ambulatory Visit: Payer: Medicare Other | Admitting: Medical

## 2022-09-09 ENCOUNTER — Ambulatory Visit: Payer: Medicare Other | Admitting: Internal Medicine

## 2022-09-12 ENCOUNTER — Encounter: Payer: Self-pay | Admitting: Medical

## 2022-09-26 ENCOUNTER — Ambulatory Visit: Payer: Medicare Other | Admitting: Medical

## 2022-10-04 ENCOUNTER — Ambulatory Visit (INDEPENDENT_AMBULATORY_CARE_PROVIDER_SITE_OTHER): Payer: Medicare Other | Admitting: Medical

## 2022-10-04 VITALS — BP 110/72 | HR 94 | Temp 98.2°F | Resp 18 | Ht 68.0 in | Wt 170.0 lb

## 2022-10-04 DIAGNOSIS — R739 Hyperglycemia, unspecified: Secondary | ICD-10-CM | POA: Diagnosis not present

## 2022-10-04 DIAGNOSIS — K439 Ventral hernia without obstruction or gangrene: Secondary | ICD-10-CM | POA: Diagnosis not present

## 2022-10-04 DIAGNOSIS — E785 Hyperlipidemia, unspecified: Secondary | ICD-10-CM | POA: Diagnosis not present

## 2022-10-04 DIAGNOSIS — G629 Polyneuropathy, unspecified: Secondary | ICD-10-CM | POA: Diagnosis not present

## 2022-10-04 DIAGNOSIS — R112 Nausea with vomiting, unspecified: Secondary | ICD-10-CM

## 2022-10-04 LAB — CBC WITH DIFFERENTIAL/PLATELET
Absolute Monocytes: 593 cells/uL (ref 200–950)
Basophils Absolute: 71 cells/uL (ref 0–200)
Basophils Relative: 0.9 %
Eosinophils Absolute: 308 cells/uL (ref 15–500)
Eosinophils Relative: 3.9 %
HCT: 45.1 % (ref 38.5–50.0)
Hemoglobin: 14.8 g/dL (ref 13.2–17.1)
Lymphs Abs: 1809 cells/uL (ref 850–3900)
MCH: 27 pg (ref 27.0–33.0)
MCHC: 32.8 g/dL (ref 32.0–36.0)
MCV: 82.3 fL (ref 80.0–100.0)
MPV: 9.3 fL (ref 7.5–12.5)
Monocytes Relative: 7.5 %
Neutro Abs: 5119 cells/uL (ref 1500–7800)
Neutrophils Relative %: 64.8 %
Platelets: 374 10*3/uL (ref 140–400)
RBC: 5.48 10*6/uL (ref 4.20–5.80)
RDW: 13.2 % (ref 11.0–15.0)
Total Lymphocyte: 22.9 %
WBC: 7.9 10*3/uL (ref 3.8–10.8)

## 2022-10-04 LAB — COMPREHENSIVE METABOLIC PANEL
AG Ratio: 1.4 (calc) (ref 1.0–2.5)
ALT: 15 U/L (ref 9–46)
AST: 10 U/L (ref 10–35)
Albumin: 4.3 g/dL (ref 3.6–5.1)
Alkaline phosphatase (APISO): 70 U/L (ref 35–144)
BUN: 17 mg/dL (ref 7–25)
CO2: 26 mmol/L (ref 20–32)
Calcium: 9.2 mg/dL (ref 8.6–10.3)
Chloride: 102 mmol/L (ref 98–110)
Creat: 1.18 mg/dL (ref 0.70–1.35)
Globulin: 3 g/dL (calc) (ref 1.9–3.7)
Glucose, Bld: 119 mg/dL — ABNORMAL HIGH (ref 65–99)
Potassium: 4.5 mmol/L (ref 3.5–5.3)
Sodium: 138 mmol/L (ref 135–146)
Total Bilirubin: 0.6 mg/dL (ref 0.2–1.2)
Total Protein: 7.3 g/dL (ref 6.1–8.1)

## 2022-10-04 LAB — LIPASE: Lipase: 17 U/L (ref 7–60)

## 2022-10-04 NOTE — Progress Notes (Signed)
   Subjective:    Patient ID: Jacob Cameron, male    DOB: 07/17/1955, 67 y.o.   MRN: [id number]  HPI Pt in for follow up.   I have not seen pt for since 03-05-2021.  He had been visiting home country [place] for 12 months. He got back from [place] one month ago.   In past when I saw him no abnormality on abdomen exam. Pt stats he had small lump in abdomen. Over past year he has increased swelling. He states abdomen hurts.    3 days ago he vomited one time. One week ago vomited one time. No pain presently. No nausea or vomiting. Day he vomited one time had pain for 30 minutes and pain resolved. Symptos did not return.   Elevated sugar levels in past. He states both feet feel numb.         Review of Systems  Constitutional:  Negative for chills and fever.  Respiratory:  Negative for cough, chest tightness, shortness of breath and wheezing.   Cardiovascular:  Negative for chest pain and palpitations.  Gastrointestinal:  Negative for abdominal pain, constipation, diarrhea, nausea and vomiting.       Vomited one time 3 days ago. And other event one time one week ago. Some abd pain both times when vomited.  Genitourinary:  Negative for dysuria, flank pain and frequency.  Musculoskeletal:  Positive for back pain.       No back pain notes except rare and occasional. Not noticeabe.  Neurological:  Negative for dizziness, tremors, speech difficulty, weakness and headaches.       Lower ext neuropathy feet pain.        Objective:   Physical Exam  General- No acute distress. Pleasant patient. Neck- Full range of motion, no jvd Lungs- Clear, even and unlabored. Heart- regular rate and rhythm. Neurologic- CNII- XII grossly intact.  Abdomen-soft, nt, nd, +bs, no rebound or guarding. Moderate to large vental hernia. Can reduce only briefly. No rash to skin, no induration. No warmth. Back-no cva tenderness.   Lower ext bilateral lower ext- monofilament exam normal. No breakdown  of skin. No lesions.     Assessment & Plan:  [phone] farrukh  Ventral hernia without obstruction or gangrene -     CBC with Differential/Platelet -     Comprehensive metabolic panel -     Lipase -     Ambulatory referral to General Surgery  Nausea and vomiting, unspecified vomiting type -     CBC with Differential/Platelet -     Comprehensive metabolic panel -     Lipase -     Ambulatory referral to General Surgery  Elevated blood sugar -     Hemoglobin A1c  Neuropathy -     Vitamin B12 -     Vitamin B1  Hyperlipidemia, unspecified hyperlipidemia type -     Lipid panel  On exam today and don't suspect any complication from hernia such as incarceration or strangulation. We need to follow you closely. If you have any constant abd or repeat episodes of vomiting be seen in the emergency department. See education information.   For severe  abdomen pain and vomiting then be seen in the ED.  Follow up with me in one week or sooner if needed.  Mackie Pai, PA-C   Time spent with patient today was 42  minutes which consisted of chart revdiew, discussing diagnosis, work up treatment and documentation.

## 2022-10-04 NOTE — Patient Instructions (Addendum)
Ventral hernia without obstruction or gangrene -     CBC with Differential/Platelet -     Comprehensive metabolic panel -     Lipase -     Ambulatory referral to General Surgery  Nausea and vomiting, unspecified vomiting type -     CBC with Differential/Platelet -     Comprehensive metabolic panel -     Lipase -     Ambulatory referral to General Surgery  Elevated blood sugar -     Hemoglobin A1c  Neuropathy -     Vitamin B12 -     Vitamin B1  Hyperlipidemia, unspecified hyperlipidemia type -     Lipid panel   Note regarding lower ext feet pain. Will get labs. But we also reviewed prior back mri. 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     On exam today and don't suspect any complication from hernia such as incarceration or strangulation. We need to follow you closely. If you have any constant abd or repeat episodes of vomiting be seen in the emergency department. See education information.   For severe  abdomen pain and vomiting then be seen in the ED.  Follow up with me in one week or sooner if needed.  Ventral Hernia  A ventral hernia is a bulge of tissue from inside the abdomen that pushes through a weak area of the muscles that form the front wall of the abdomen. The tissues inside the abdomen are inside a sac (peritoneum). These tissues include the small intestine, large intestine, and the fatty tissue that covers the intestines (omentum). Sometimes, the bulge that forms a hernia contains intestines. Other hernias contain only fat. Ventral hernias do not go away without surgical treatment. There are several types of ventral hernias. You may have: A hernia at an incision site from previous abdominal surgery (incisional hernia). A hernia just above the belly button (epigastric hernia), or at the belly button (umbilical hernia). These types of hernias can develop from heavy lifting or straining. A hernia  that comes and goes (reducible hernia). It may be visible only when you lift or strain. This type of hernia can be pushed back into the abdomen (reduced). A hernia that traps abdominal tissue inside the hernia (incarcerated hernia). This type of hernia does not reduce. A hernia that cuts off blood flow to the tissues inside the hernia (strangulated hernia). The tissues can start to die if this happens. This is a very painful bulge that cannot be reduced. A strangulated hernia is a medical emergency. What are the causes? This condition is caused by abdominal tissue putting pressure on an area of weakness in the abdominal muscles. What increases the risk? The following factors may make you more likely to develop this condition: Being age 71 or older. Being overweight or obese. Having had previous abdominal surgery, especially if there was an infection after surgery. Having had an injury to the abdominal wall. Frequently lifting or pushing heavy objects. Having had several pregnancies. Having a buildup of fluid inside the abdomen (ascites). Straining to have a bowel movement or to urinate. Having frequent coughing episodes. What are the signs or symptoms? The only symptom of a ventral hernia may be a painless bulge in the abdomen. A reducible hernia may be visible only when you strain, cough, or lift. Other symptoms may include: Dull pain. A feeling of pressure. Signs and symptoms of a strangulated  hernia may include: Increasing pain. Nausea and vomiting. Pain when pressing on the hernia. The skin over the hernia turning red or purple. Constipation. Blood in the stool (feces). How is this diagnosed? This condition may be diagnosed based on: Your symptoms. Your medical history. A physical exam. You may be asked to cough or strain while standing. These actions increase the pressure inside your abdomen and force the hernia through the opening in your muscles. Your health care provider may  try to reduce the hernia by gently pushing the hernia back in. Imaging studies, such as an ultrasound or CT scan. How is this treated? This condition is treated with surgery. If you have a strangulated hernia, surgery is done as soon as possible. If your hernia is small and not incarcerated, you may be asked to lose some weight before surgery. Follow these instructions at home: Follow instructions from your health care provider about eating or drinking restrictions. If you are overweight, your health care provider may recommend that you increase your activity level and eat a healthier diet. Do not lift anything that is heavier than 10 lb (4.5 kg), or the limit that you are told, until your health care provider says that it is safe. Return to your normal activities as told by your health care provider. Ask your health care provider what activities are safe for you. You may need to avoid activities that increase pressure on your hernia. Take over-the-counter and prescription medicines only as told by your health care provider. Keep all follow-up visits. This is important. Contact a health care provider if: Your hernia gets larger. Your hernia becomes painful. Get help right away if: Your hernia becomes increasingly painful. You have pain along with any of the following: Changes in skin color in the area of the hernia. Nausea. Vomiting. Fever. These symptoms may represent a serious problem that is an emergency. Do not wait to see if the symptoms will go away. Get medical help right away. Call your local emergency services (911 in the [place] Do not drive yourself to the hospital. Summary A ventral hernia is a bulge of tissue from inside the abdomen that pushes through a weak area of the muscles that form the front wall of the abdomen. This condition is treated with surgery, which may be urgent depending on your hernia. Do not lift anything that is heavier than 10 lb (4.5 kg), and follow activity  instructions from your health care provider. This information is not intended to replace advice given to you by your health care provider. Make sure you discuss any questions you have with your health care provider. Document Revised: 02/14/2020 Document Reviewed: 02/14/2020 Elsevier Patient Education  [place].

## 2022-10-05 LAB — LDL CHOLESTEROL, DIRECT: Direct LDL: 134 mg/dL

## 2022-10-05 LAB — LIPID PANEL
Cholesterol: 234 mg/dL — ABNORMAL HIGH (ref 0–200)
HDL: 38 mg/dL — ABNORMAL LOW (ref >39.00)
NonHDL: 196.31
Total CHOL/HDL Ratio: 6
Triglycerides: 228 mg/dL — ABNORMAL HIGH (ref 0.0–149.0)
VLDL: 45.6 mg/dL — ABNORMAL HIGH (ref 0.0–40.0)

## 2022-10-05 LAB — HEMOGLOBIN A1C: Hgb A1c MFr Bld: 6.9 % — ABNORMAL HIGH (ref 4.6–6.5)

## 2022-10-05 LAB — VITAMIN B12: Vitamin B-12: 184 pg/mL — ABNORMAL LOW (ref 211–911)

## 2022-10-05 MED ORDER — ROSUVASTATIN CALCIUM 10 MG PO TABS: 10.0000 mg | ORAL_TABLET | Freq: Every day | ORAL | 3 refills | Status: AC

## 2022-10-05 MED ORDER — METFORMIN HCL 500 MG PO TABS: 500.0000 mg | ORAL_TABLET | Freq: Two times a day (BID) | ORAL | 3 refills | Status: AC

## 2022-10-05 NOTE — Addendum Note (Signed)
Addended by: Anabel Halon on: 10/05/2022 08:29 PM   Modules accepted: Orders

## 2022-10-07 LAB — VITAMIN B1: Vitamin B1 (Thiamine): 11 nmol/L (ref 8–30)

## 2022-10-14 IMAGING — MR MR HEAD W/O CM
7 of 11 series · 25 of 48 positions shown · non-contrast
Comparison: None.

CLINICAL DATA: Neuro deficit, acute stroke suspected.

EXAM:
MRI HEAD WITHOUT CONTRAST
TECHNIQUE: Multiplanar, multiecho pulse sequences of the brain and surrounding
structures were obtained without intravenous contrast.

[Series 2: DWI · axial · 3.0mm · 0.94mm/px · z∈[-61,+80]mm · 7 of 96 slices shown (1 of 2)]
[im 1/96]
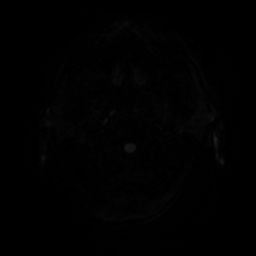
[im 16/96]
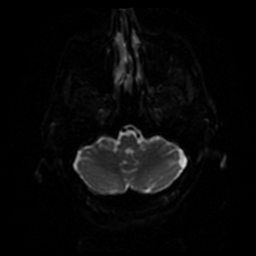
[im 32/96]
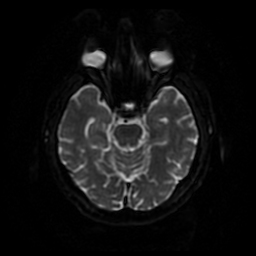
[im 48/96]
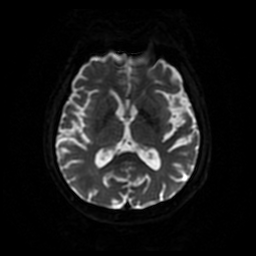
[im 64/96]
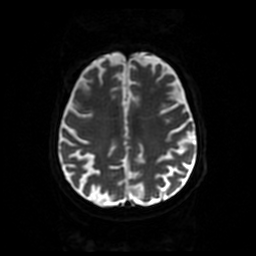
[im 80/96]
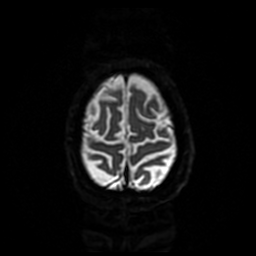
[im 96/96]
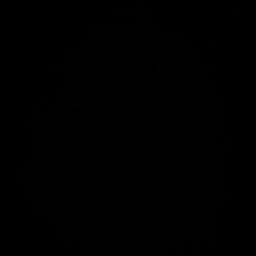

[Series 3: DWI · coronal · 4.0mm · 0.94mm/px · 6 of 70 slices shown (2 of 2)]
[im 1/70]
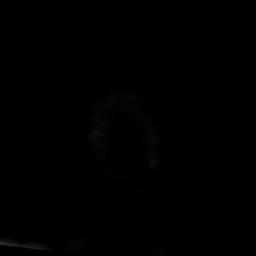
[im 14/70]
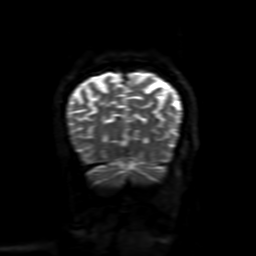
[im 28/70]
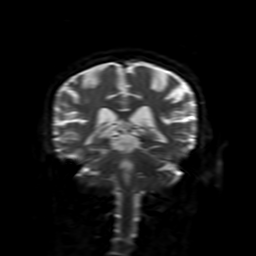
[im 42/70]
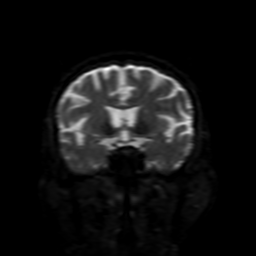
[im 56/70]
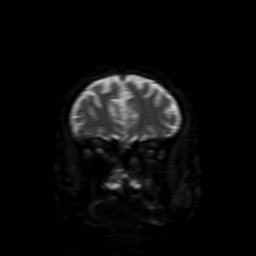
[im 70/70]
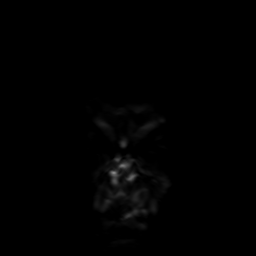

[Series 4: FLAIR · sagittal · 5.0mm · 0.23mm/px · 2 of 26 slices shown (1 of 2)]
[im 1/26]
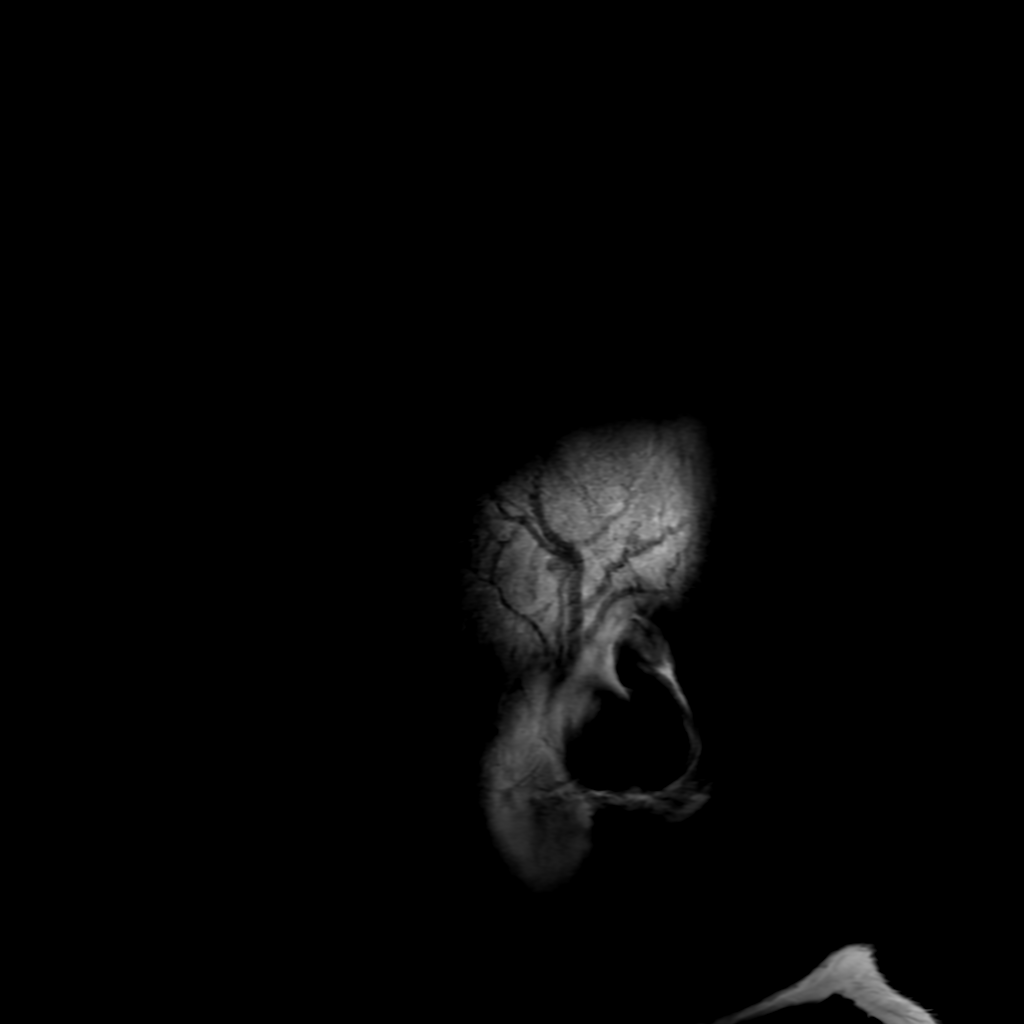
[im 26/26]
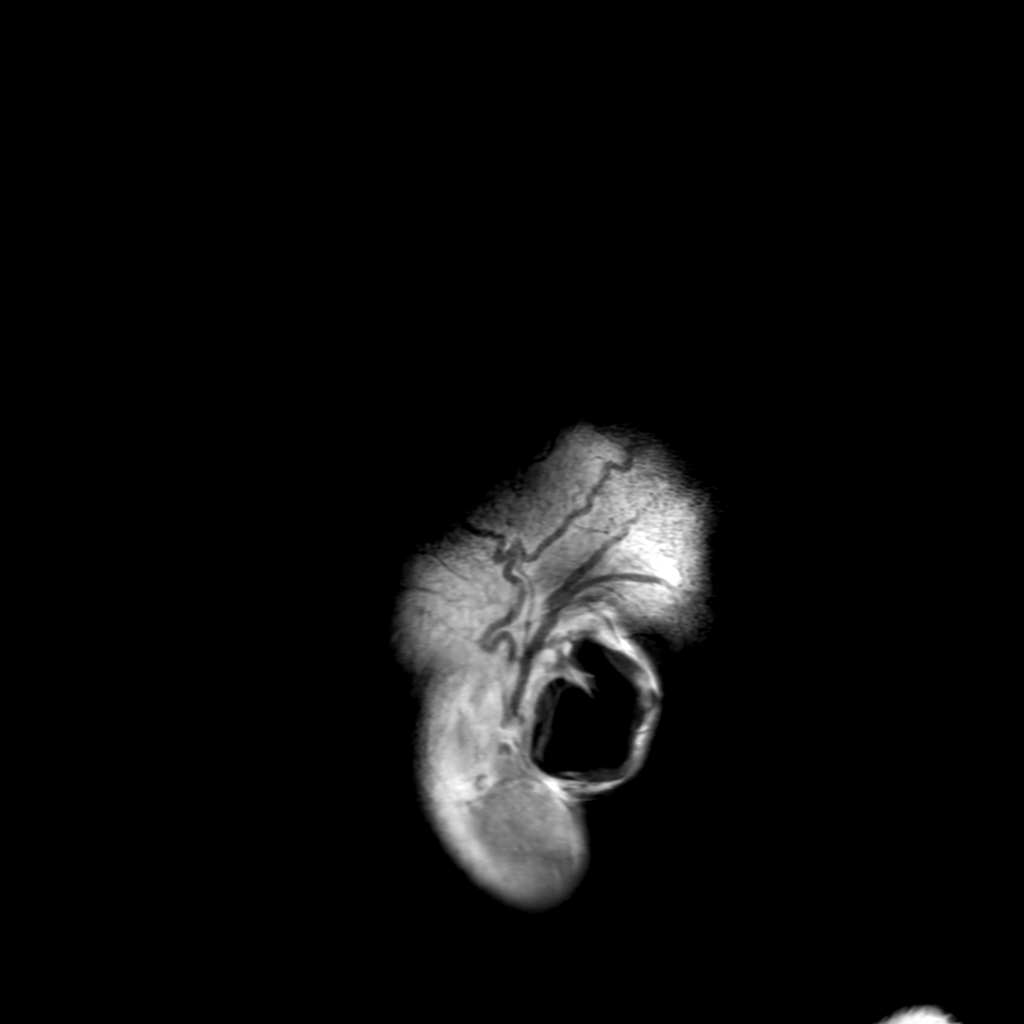

[Series 5: T2 · axial · 5.0mm · 0.23mm/px · 1 of 25 slices shown]
[im 1/25]
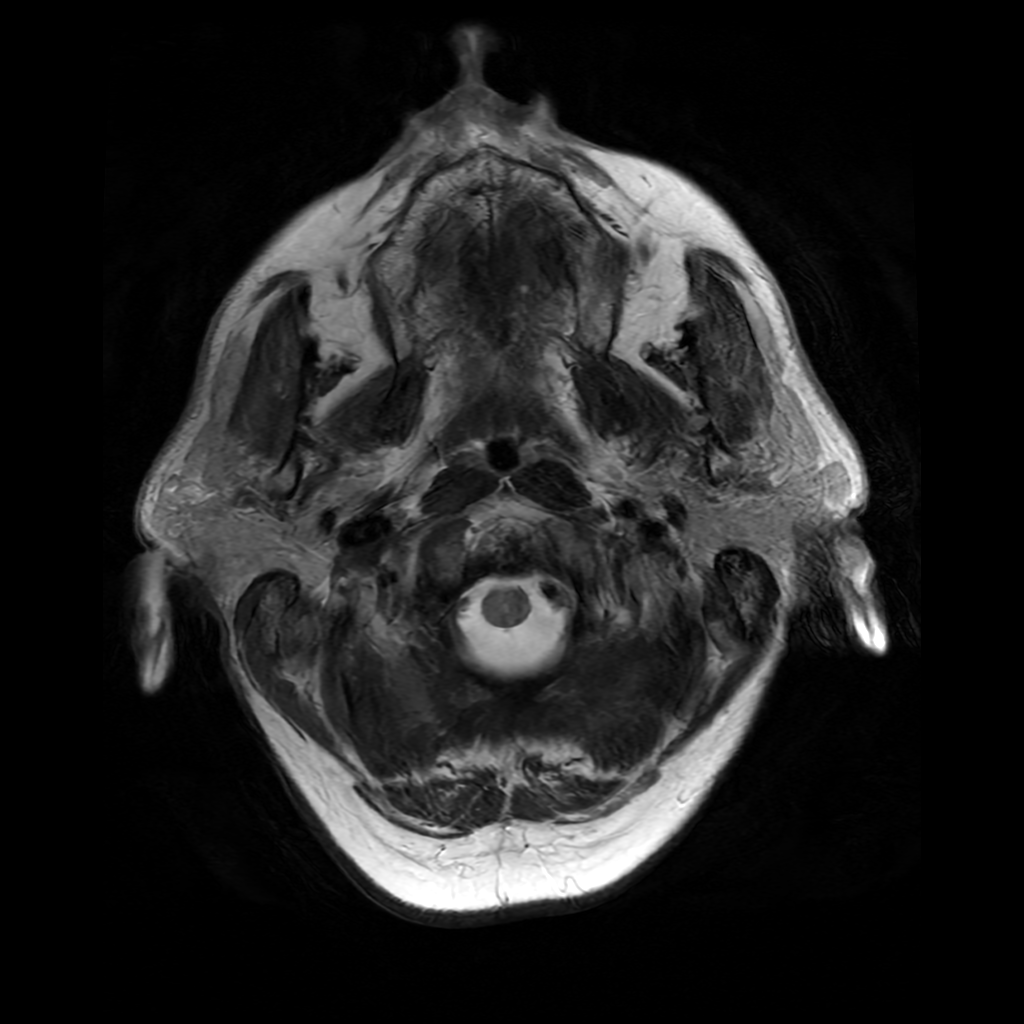

[Series 6: FLAIR · axial · 3.0mm · 0.45mm/px · z∈[-58,+80]mm · 2 of 24 slices shown (2 of 2)]
[im 1/24]
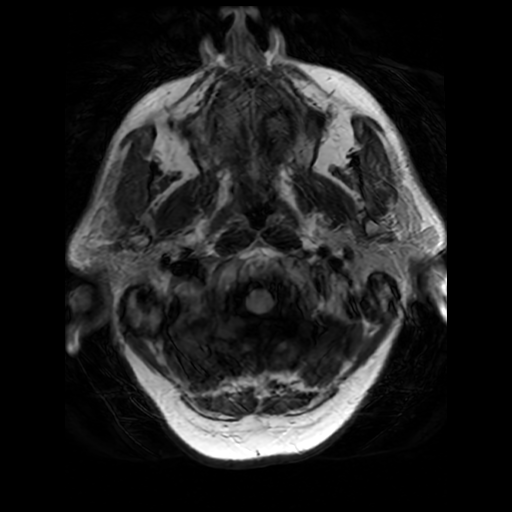
[im 24/24]
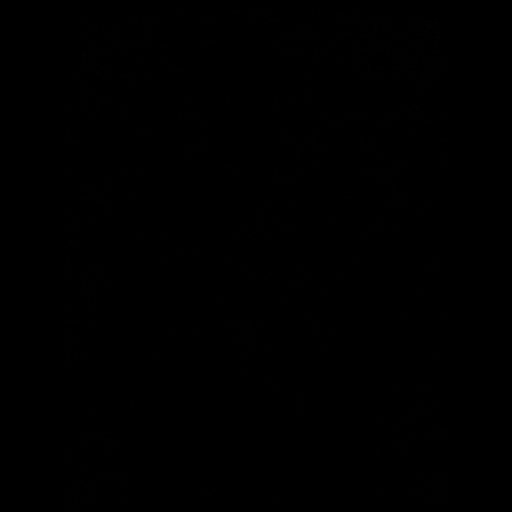

[Series 250: ADC · axial · 3.0mm · 0.94mm/px · z∈[-61,+80]mm · 4 of 48 slices shown (1 of 2)]
[im 1/48]
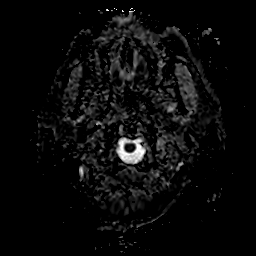
[im 16/48]
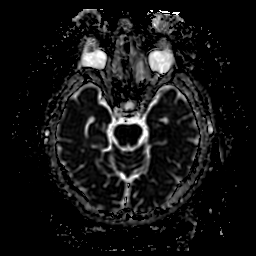
[im 32/48]
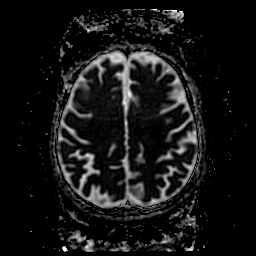
[im 48/48]
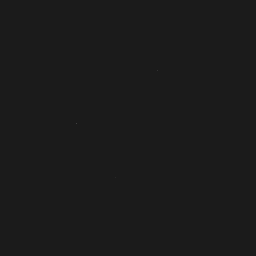

[Series 350: ADC · coronal · 4.0mm · 0.94mm/px · 3 of 35 slices shown (2 of 2)]
[im 1/35]
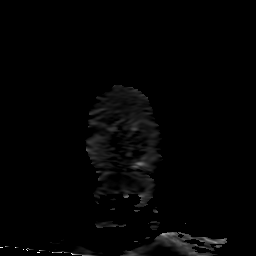
[im 18/35]
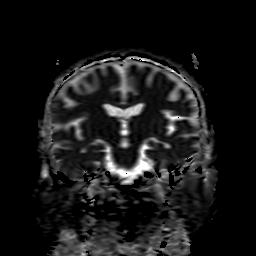
[im 35/35]
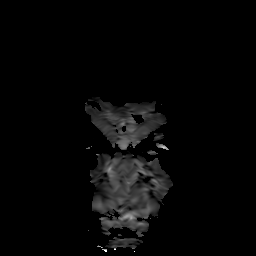

[25 of 48 positions shown; findings below may reference images not displayed]

FINDINGS: Brain: No acute infarction, hemorrhage, hydrocephalus, extra-axial
collection or mass lesion. Mild for age T2/FLAIR hyperintensities
within the white matter, likely related to chronic microvascular
ischemic disease. Mild diffuse cerebral atrophy.

Vascular: Major arterial flow voids are maintained at the skull
base.

Skull and upper cervical spine: Normal marrow signal.

Sinuses/Orbits: Scattered paranasal sinus mucosal thickening with
air-fluid level in the left maxillary sinus.

Other: Motion limited study.
IMPRESSION: 1. No evidence of acute intracranial abnormality on this motion
limited exam. No acute infarct.
2. Nonspecific paranasal sinus disease with air-fluid level in the
left maxillary sinus. Correlate with signs/symptoms of sinusitis.

## 2022-10-24 ENCOUNTER — Encounter: Payer: Self-pay | Admitting: *Deleted
